# Patient Record
Sex: Female | Born: 1985 | Race: Black or African American | Hispanic: No | Smoking: Former smoker
Health system: Southern US, Community
[De-identification: ages and names within clinical notes are randomized; demographics above are authoritative.]

## PROBLEM LIST (undated history)

## (undated) DIAGNOSIS — R0602 Shortness of breath: Secondary | ICD-10-CM

## (undated) DIAGNOSIS — F32A Depression, unspecified: Secondary | ICD-10-CM

## (undated) DIAGNOSIS — Z5189 Encounter for other specified aftercare: Secondary | ICD-10-CM

## (undated) DIAGNOSIS — F419 Anxiety disorder, unspecified: Secondary | ICD-10-CM

## (undated) DIAGNOSIS — F4310 Post-traumatic stress disorder, unspecified: Secondary | ICD-10-CM

## (undated) DIAGNOSIS — F329 Major depressive disorder, single episode, unspecified: Secondary | ICD-10-CM

## (undated) DIAGNOSIS — A749 Chlamydial infection, unspecified: Secondary | ICD-10-CM

## (undated) DIAGNOSIS — E7800 Pure hypercholesterolemia, unspecified: Secondary | ICD-10-CM

## (undated) DIAGNOSIS — Z22322 Carrier or suspected carrier of Methicillin resistant Staphylococcus aureus: Secondary | ICD-10-CM

## (undated) DIAGNOSIS — R51 Headache: Secondary | ICD-10-CM

## (undated) DIAGNOSIS — N12 Tubulo-interstitial nephritis, not specified as acute or chronic: Secondary | ICD-10-CM

## (undated) DIAGNOSIS — J189 Pneumonia, unspecified organism: Secondary | ICD-10-CM

## (undated) DIAGNOSIS — N888 Other specified noninflammatory disorders of cervix uteri: Secondary | ICD-10-CM

## (undated) DIAGNOSIS — G43909 Migraine, unspecified, not intractable, without status migrainosus: Secondary | ICD-10-CM

---
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
## 2006-05-09 ENCOUNTER — Emergency Department (HOSPITAL_COMMUNITY): Admission: EM | Admit: 2006-05-09 | Discharge: 2006-05-10 | Payer: Self-pay | Admitting: Emergency Medicine

## 2006-06-18 ENCOUNTER — Ambulatory Visit (HOSPITAL_COMMUNITY): Admission: RE | Admit: 2006-06-18 | Discharge: 2006-06-18 | Payer: Self-pay | Admitting: Family Medicine

## 2006-07-19 ENCOUNTER — Inpatient Hospital Stay (HOSPITAL_COMMUNITY): Admission: AD | Admit: 2006-07-19 | Discharge: 2006-07-19 | Payer: Self-pay | Admitting: Obstetrics and Gynecology

## 2006-10-01 ENCOUNTER — Inpatient Hospital Stay (HOSPITAL_COMMUNITY): Admission: AD | Admit: 2006-10-01 | Discharge: 2006-10-05 | Payer: Self-pay | Admitting: Obstetrics and Gynecology

## 2006-12-15 ENCOUNTER — Inpatient Hospital Stay (HOSPITAL_COMMUNITY): Admission: AD | Admit: 2006-12-15 | Discharge: 2006-12-16 | Payer: Self-pay | Admitting: Obstetrics and Gynecology

## 2006-12-24 ENCOUNTER — Inpatient Hospital Stay (HOSPITAL_COMMUNITY): Admission: AD | Admit: 2006-12-24 | Discharge: 2006-12-24 | Payer: Self-pay | Admitting: Obstetrics and Gynecology

## 2006-12-25 ENCOUNTER — Inpatient Hospital Stay (HOSPITAL_COMMUNITY): Admission: AD | Admit: 2006-12-25 | Discharge: 2006-12-25 | Payer: Self-pay | Admitting: Obstetrics and Gynecology

## 2006-12-28 ENCOUNTER — Inpatient Hospital Stay (HOSPITAL_COMMUNITY): Admission: AD | Admit: 2006-12-28 | Discharge: 2006-12-28 | Payer: Self-pay | Admitting: Obstetrics and Gynecology

## 2007-01-02 ENCOUNTER — Inpatient Hospital Stay (HOSPITAL_COMMUNITY): Admission: AD | Admit: 2007-01-02 | Discharge: 2007-01-02 | Payer: Self-pay | Admitting: Obstetrics and Gynecology

## 2007-01-08 ENCOUNTER — Inpatient Hospital Stay (HOSPITAL_COMMUNITY): Admission: AD | Admit: 2007-01-08 | Discharge: 2007-01-08 | Payer: Self-pay | Admitting: Obstetrics and Gynecology

## 2007-01-09 ENCOUNTER — Inpatient Hospital Stay (HOSPITAL_COMMUNITY): Admission: AD | Admit: 2007-01-09 | Discharge: 2007-01-09 | Payer: Self-pay | Admitting: Obstetrics and Gynecology

## 2007-01-11 ENCOUNTER — Inpatient Hospital Stay (HOSPITAL_COMMUNITY): Admission: AD | Admit: 2007-01-11 | Discharge: 2007-01-12 | Payer: Self-pay | Admitting: Obstetrics and Gynecology

## 2007-01-17 ENCOUNTER — Inpatient Hospital Stay (HOSPITAL_COMMUNITY): Admission: AD | Admit: 2007-01-17 | Discharge: 2007-01-22 | Payer: Self-pay | Admitting: Obstetrics and Gynecology

## 2007-01-18 ENCOUNTER — Encounter (INDEPENDENT_AMBULATORY_CARE_PROVIDER_SITE_OTHER): Payer: Self-pay | Admitting: Obstetrics and Gynecology

## 2007-01-23 ENCOUNTER — Encounter: Admission: RE | Admit: 2007-01-23 | Discharge: 2007-02-22 | Payer: Self-pay | Admitting: Obstetrics and Gynecology

## 2007-02-23 ENCOUNTER — Encounter: Admission: RE | Admit: 2007-02-23 | Discharge: 2007-03-24 | Payer: Self-pay | Admitting: Obstetrics and Gynecology

## 2007-03-25 ENCOUNTER — Encounter: Admission: RE | Admit: 2007-03-25 | Discharge: 2007-04-24 | Payer: Self-pay | Admitting: Obstetrics and Gynecology

## 2007-04-25 ENCOUNTER — Encounter: Admission: RE | Admit: 2007-04-25 | Discharge: 2007-05-25 | Payer: Self-pay | Admitting: Obstetrics and Gynecology

## 2007-05-26 ENCOUNTER — Encounter: Admission: RE | Admit: 2007-05-26 | Discharge: 2007-06-25 | Payer: Self-pay | Admitting: Obstetrics and Gynecology

## 2007-08-05 ENCOUNTER — Emergency Department (HOSPITAL_COMMUNITY): Admission: EM | Admit: 2007-08-05 | Discharge: 2007-08-05 | Payer: Self-pay | Admitting: *Deleted

## 2007-08-24 ENCOUNTER — Emergency Department (HOSPITAL_COMMUNITY): Admission: EM | Admit: 2007-08-24 | Discharge: 2007-08-24 | Payer: Self-pay | Admitting: Emergency Medicine

## 2007-09-01 ENCOUNTER — Encounter: Admission: RE | Admit: 2007-09-01 | Discharge: 2007-09-01 | Payer: Self-pay | Admitting: Pediatrics

## 2007-09-01 ENCOUNTER — Ambulatory Visit: Payer: Self-pay | Admitting: Family Medicine

## 2008-03-19 ENCOUNTER — Emergency Department (HOSPITAL_COMMUNITY): Admission: EM | Admit: 2008-03-19 | Discharge: 2008-03-19 | Payer: Self-pay | Admitting: Emergency Medicine

## 2008-05-09 ENCOUNTER — Emergency Department (HOSPITAL_COMMUNITY): Admission: EM | Admit: 2008-05-09 | Discharge: 2008-05-09 | Payer: Self-pay | Admitting: Emergency Medicine

## 2008-09-12 ENCOUNTER — Emergency Department (HOSPITAL_COMMUNITY): Admission: EM | Admit: 2008-09-12 | Discharge: 2008-09-12 | Payer: Self-pay | Admitting: Emergency Medicine

## 2008-10-18 ENCOUNTER — Emergency Department (HOSPITAL_COMMUNITY): Admission: EM | Admit: 2008-10-18 | Discharge: 2008-10-18 | Payer: Self-pay | Admitting: Emergency Medicine

## 2008-11-28 ENCOUNTER — Emergency Department (HOSPITAL_COMMUNITY): Admission: EM | Admit: 2008-11-28 | Discharge: 2008-11-28 | Payer: Self-pay | Admitting: Emergency Medicine

## 2009-01-27 ENCOUNTER — Emergency Department (HOSPITAL_COMMUNITY): Admission: EM | Admit: 2009-01-27 | Discharge: 2009-01-27 | Payer: Self-pay | Admitting: Emergency Medicine

## 2009-05-24 ENCOUNTER — Emergency Department (HOSPITAL_COMMUNITY): Admission: EM | Admit: 2009-05-24 | Discharge: 2009-05-24 | Payer: Self-pay | Admitting: Emergency Medicine

## 2009-06-10 ENCOUNTER — Emergency Department (HOSPITAL_COMMUNITY): Admission: EM | Admit: 2009-06-10 | Discharge: 2009-06-10 | Payer: Self-pay | Admitting: Emergency Medicine

## 2009-07-05 ENCOUNTER — Emergency Department (HOSPITAL_COMMUNITY): Admission: EM | Admit: 2009-07-05 | Discharge: 2009-07-05 | Payer: Self-pay | Admitting: Emergency Medicine

## 2009-07-31 ENCOUNTER — Emergency Department (HOSPITAL_COMMUNITY): Admission: EM | Admit: 2009-07-31 | Discharge: 2009-07-31 | Payer: Self-pay | Admitting: Emergency Medicine

## 2009-08-26 DIAGNOSIS — J189 Pneumonia, unspecified organism: Secondary | ICD-10-CM

## 2009-08-26 HISTORY — DX: Pneumonia, unspecified organism: J18.9

## 2009-09-25 ENCOUNTER — Emergency Department (HOSPITAL_COMMUNITY): Admission: EM | Admit: 2009-09-25 | Discharge: 2009-09-25 | Payer: Self-pay | Admitting: Emergency Medicine

## 2010-05-03 ENCOUNTER — Emergency Department (HOSPITAL_COMMUNITY): Admission: EM | Admit: 2010-05-03 | Discharge: 2010-05-03 | Payer: Self-pay | Admitting: Emergency Medicine

## 2010-08-01 ENCOUNTER — Emergency Department (HOSPITAL_COMMUNITY)
Admission: EM | Admit: 2010-08-01 | Discharge: 2010-08-01 | Payer: Self-pay | Source: Home / Self Care | Admitting: Emergency Medicine

## 2010-09-03 ENCOUNTER — Emergency Department (HOSPITAL_COMMUNITY)
Admission: EM | Admit: 2010-09-03 | Discharge: 2010-09-03 | Payer: Self-pay | Source: Home / Self Care | Admitting: Emergency Medicine

## 2010-09-10 LAB — POCT I-STAT, CHEM 8
BUN: 11 mg/dL (ref 6–23)
Calcium, Ion: 1.15 mmol/L (ref 1.12–1.32)
Chloride: 103 mEq/L (ref 96–112)
Creatinine, Ser: 1 mg/dL (ref 0.4–1.2)
Glucose, Bld: 89 mg/dL (ref 70–99)
HCT: 37 % (ref 36.0–46.0)
Hemoglobin: 12.6 g/dL (ref 12.0–15.0)
Potassium: 3.5 mEq/L (ref 3.5–5.1)
Sodium: 141 mEq/L (ref 135–145)
TCO2: 27 mmol/L (ref 0–100)

## 2010-09-10 LAB — D-DIMER, QUANTITATIVE: D-Dimer, Quant: 1.11 ug/mL-FEU — ABNORMAL HIGH (ref 0.00–0.48)

## 2010-09-15 ENCOUNTER — Encounter: Payer: Self-pay | Admitting: Family Medicine

## 2010-11-08 LAB — CBC
MCH: 27.2 pg (ref 26.0–34.0)
MCHC: 32.6 g/dL (ref 30.0–36.0)
MCV: 83.5 fL (ref 78.0–100.0)
Platelets: 315 10*3/uL (ref 150–400)
RBC: 4.37 MIL/uL (ref 3.87–5.11)

## 2010-11-08 LAB — DIFFERENTIAL
Basophils Relative: 0 % (ref 0–1)
Eosinophils Absolute: 0.3 10*3/uL (ref 0.0–0.7)
Eosinophils Relative: 4 % (ref 0–5)
Lymphs Abs: 2.3 10*3/uL (ref 0.7–4.0)
Monocytes Relative: 9 % (ref 3–12)

## 2010-11-08 LAB — URINALYSIS, ROUTINE W REFLEX MICROSCOPIC
Bilirubin Urine: NEGATIVE
Nitrite: NEGATIVE
Specific Gravity, Urine: 1.03 (ref 1.005–1.030)
Urobilinogen, UA: 1 mg/dL (ref 0.0–1.0)
pH: 6 (ref 5.0–8.0)

## 2010-11-08 LAB — URINE MICROSCOPIC-ADD ON

## 2010-11-08 LAB — BASIC METABOLIC PANEL
CO2: 27 mEq/L (ref 19–32)
Chloride: 101 mEq/L (ref 96–112)
Creatinine, Ser: 0.91 mg/dL (ref 0.4–1.2)
GFR calc Af Amer: >60 mL/min (ref >60)
Glucose, Bld: 93 mg/dL (ref 70–99)

## 2010-11-15 ENCOUNTER — Emergency Department (HOSPITAL_COMMUNITY)
Admission: EM | Admit: 2010-11-15 | Discharge: 2010-11-15 | Disposition: A | Discharge status: Home / Self Care | Payer: Medicaid Other | Attending: Emergency Medicine | Admitting: Emergency Medicine

## 2010-11-15 DIAGNOSIS — S025XXA Fracture of tooth (traumatic), initial encounter for closed fracture: Secondary | ICD-10-CM | POA: Insufficient documentation

## 2010-11-15 DIAGNOSIS — W108XXA Fall (on) (from) other stairs and steps, initial encounter: Secondary | ICD-10-CM | POA: Insufficient documentation

## 2010-11-15 DIAGNOSIS — E669 Obesity, unspecified: Secondary | ICD-10-CM | POA: Insufficient documentation

## 2010-11-15 DIAGNOSIS — K089 Disorder of teeth and supporting structures, unspecified: Secondary | ICD-10-CM | POA: Insufficient documentation

## 2010-11-18 ENCOUNTER — Emergency Department (HOSPITAL_COMMUNITY)
Admission: EM | Admit: 2010-11-18 | Discharge: 2010-11-18 | Disposition: A | Discharge status: Home / Self Care | Payer: Medicaid Other | Attending: Emergency Medicine | Admitting: Emergency Medicine

## 2010-11-18 DIAGNOSIS — R10819 Abdominal tenderness, unspecified site: Secondary | ICD-10-CM | POA: Insufficient documentation

## 2010-11-18 DIAGNOSIS — R109 Unspecified abdominal pain: Secondary | ICD-10-CM | POA: Insufficient documentation

## 2010-11-18 DIAGNOSIS — N949 Unspecified condition associated with female genital organs and menstrual cycle: Secondary | ICD-10-CM | POA: Insufficient documentation

## 2010-11-18 DIAGNOSIS — N938 Other specified abnormal uterine and vaginal bleeding: Secondary | ICD-10-CM | POA: Insufficient documentation

## 2010-11-18 LAB — POCT I-STAT, CHEM 8
BUN: 8 mg/dL (ref 6–23)
Calcium, Ion: 1.09 mmol/L — ABNORMAL LOW (ref 1.12–1.32)
Creatinine, Ser: 0.8 mg/dL (ref 0.4–1.2)
TCO2: 25 mmol/L (ref 0–100)

## 2010-11-28 LAB — DIFFERENTIAL
Basophils Absolute: 0.1 10*3/uL (ref 0.0–0.1)
Lymphocytes Relative: 10 % — ABNORMAL LOW (ref 12–46)
Lymphs Abs: 0.8 10*3/uL (ref 0.7–4.0)
Monocytes Absolute: 0.7 10*3/uL (ref 0.1–1.0)
Monocytes Relative: 9 % (ref 3–12)
Neutro Abs: 6.7 10*3/uL (ref 1.7–7.7)

## 2010-11-28 LAB — CBC
HCT: 39.9 % (ref 36.0–46.0)
MCHC: 33.4 g/dL (ref 30.0–36.0)
MCV: 84.6 fL (ref 78.0–100.0)
Platelets: 326 10*3/uL (ref 150–400)
RDW: 13.9 % (ref 11.5–15.5)

## 2010-11-28 LAB — COMPREHENSIVE METABOLIC PANEL
Albumin: 4.1 g/dL (ref 3.5–5.2)
BUN: 13 mg/dL (ref 6–23)
Calcium: 9.1 mg/dL (ref 8.4–10.5)
Creatinine, Ser: 0.76 mg/dL (ref 0.4–1.2)
Total Bilirubin: 0.8 mg/dL (ref 0.3–1.2)
Total Protein: 8.4 g/dL — ABNORMAL HIGH (ref 6.0–8.3)

## 2010-11-28 LAB — URINALYSIS, ROUTINE W REFLEX MICROSCOPIC
Hgb urine dipstick: NEGATIVE
Specific Gravity, Urine: 1.025 (ref 1.005–1.030)
Urobilinogen, UA: 1 mg/dL (ref 0.0–1.0)

## 2010-11-28 LAB — POCT PREGNANCY, URINE: Preg Test, Ur: NEGATIVE

## 2010-11-30 LAB — URINE CULTURE

## 2010-11-30 LAB — URINALYSIS, ROUTINE W REFLEX MICROSCOPIC
Ketones, ur: NEGATIVE mg/dL
Nitrite: NEGATIVE
Protein, ur: NEGATIVE mg/dL
pH: 8 (ref 5.0–8.0)

## 2010-11-30 LAB — CBC
HCT: 39.7 % (ref 36.0–46.0)
Hemoglobin: 13.3 g/dL (ref 12.0–15.0)
MCHC: 33.4 g/dL (ref 30.0–36.0)
MCV: 85.9 fL (ref 78.0–100.0)
RBC: 4.62 MIL/uL (ref 3.87–5.11)
WBC: 9.8 10*3/uL (ref 4.0–10.5)

## 2010-11-30 LAB — DIFFERENTIAL
Eosinophils Absolute: 0 10*3/uL (ref 0.0–0.7)
Lymphs Abs: 1.2 10*3/uL (ref 0.7–4.0)
Monocytes Absolute: 0.5 10*3/uL (ref 0.1–1.0)
Monocytes Relative: 5 % (ref 3–12)
Neutrophils Relative %: 82 % — ABNORMAL HIGH (ref 43–77)

## 2010-11-30 LAB — BASIC METABOLIC PANEL
CO2: 27 mEq/L (ref 19–32)
Chloride: 99 mEq/L (ref 96–112)
Creatinine, Ser: 0.91 mg/dL (ref 0.4–1.2)
GFR calc Af Amer: >60 mL/min (ref >60)
Potassium: 3.7 mEq/L (ref 3.5–5.1)
Sodium: 134 mEq/L — ABNORMAL LOW (ref 135–145)

## 2010-11-30 LAB — POCT CARDIAC MARKERS: Troponin i, poc: <0.05 ng/mL (ref 0.00–0.09)

## 2010-12-10 LAB — CBC
HCT: 39.1 % (ref 36.0–46.0)
MCV: 86.5 fL (ref 78.0–100.0)
RBC: 4.52 MIL/uL (ref 3.87–5.11)
WBC: 7.1 10*3/uL (ref 4.0–10.5)

## 2010-12-10 LAB — URINE MICROSCOPIC-ADD ON

## 2010-12-10 LAB — COMPREHENSIVE METABOLIC PANEL
AST: 25 U/L (ref 0–37)
Alkaline Phosphatase: 54 U/L (ref 39–117)
CO2: 29 mEq/L (ref 19–32)
Chloride: 106 mEq/L (ref 96–112)
Creatinine, Ser: 0.88 mg/dL (ref 0.4–1.2)
GFR calc Af Amer: >60 mL/min (ref >60)
GFR calc non Af Amer: >60 mL/min (ref >60)
Potassium: 4.1 mEq/L (ref 3.5–5.1)
Total Bilirubin: 0.6 mg/dL (ref 0.3–1.2)

## 2010-12-10 LAB — DIFFERENTIAL
Basophils Absolute: 0 10*3/uL (ref 0.0–0.1)
Basophils Relative: 0 % (ref 0–1)
Eosinophils Absolute: 0.2 10*3/uL (ref 0.0–0.7)
Eosinophils Relative: 3 % (ref 0–5)
Lymphocytes Relative: 9 % — ABNORMAL LOW (ref 12–46)
Lymphs Abs: 0.6 10*3/uL — ABNORMAL LOW (ref 0.7–4.0)
Monocytes Absolute: 0.4 10*3/uL (ref 0.1–1.0)
Monocytes Relative: 5 % (ref 3–12)
Neutro Abs: 5.9 10*3/uL (ref 1.7–7.7)
Neutrophils Relative %: 83 % — ABNORMAL HIGH (ref 43–77)

## 2010-12-10 LAB — URINALYSIS, ROUTINE W REFLEX MICROSCOPIC
Bilirubin Urine: NEGATIVE
Hgb urine dipstick: NEGATIVE
Protein, ur: 30 mg/dL — AB
Urobilinogen, UA: 0.2 mg/dL (ref 0.0–1.0)

## 2010-12-10 LAB — LIPASE, BLOOD: Lipase: 18 U/L (ref 11–59)

## 2010-12-11 LAB — URINALYSIS, ROUTINE W REFLEX MICROSCOPIC
Bilirubin Urine: NEGATIVE
Glucose, UA: NEGATIVE mg/dL
Ketones, ur: NEGATIVE mg/dL
Nitrite: NEGATIVE
Specific Gravity, Urine: 1.026 (ref 1.005–1.030)
pH: 6.5 (ref 5.0–8.0)

## 2010-12-11 LAB — DIFFERENTIAL
Eosinophils Absolute: 0.2 10*3/uL (ref 0.0–0.7)
Eosinophils Relative: 3 % (ref 0–5)
Lymphocytes Relative: 37 % (ref 12–46)
Lymphs Abs: 2.3 10*3/uL (ref 0.7–4.0)
Monocytes Relative: 7 % (ref 3–12)

## 2010-12-11 LAB — COMPREHENSIVE METABOLIC PANEL
ALT: 12 U/L (ref 0–35)
AST: 17 U/L (ref 0–37)
Calcium: 8.9 mg/dL (ref 8.4–10.5)
Creatinine, Ser: 0.8 mg/dL (ref 0.4–1.2)
GFR calc Af Amer: >60 mL/min (ref >60)
GFR calc non Af Amer: >60 mL/min (ref >60)
Sodium: 138 mEq/L (ref 135–145)
Total Protein: 7.4 g/dL (ref 6.0–8.3)

## 2010-12-11 LAB — CBC
MCHC: 33.6 g/dL (ref 30.0–36.0)
MCV: 86 fL (ref 78.0–100.0)
Platelets: 286 10*3/uL (ref 150–400)
RDW: 13.6 % (ref 11.5–15.5)

## 2010-12-11 LAB — LIPASE, BLOOD: Lipase: 55 U/L (ref 11–59)

## 2011-01-08 ENCOUNTER — Emergency Department (HOSPITAL_COMMUNITY)
Admission: EM | Admit: 2011-01-08 | Discharge: 2011-01-08 | Disposition: A | Discharge status: Home / Self Care | Payer: Medicaid Other | Attending: Emergency Medicine | Admitting: Emergency Medicine

## 2011-01-08 ENCOUNTER — Emergency Department (HOSPITAL_COMMUNITY): Payer: Medicaid Other

## 2011-01-08 DIAGNOSIS — S335XXA Sprain of ligaments of lumbar spine, initial encounter: Secondary | ICD-10-CM | POA: Insufficient documentation

## 2011-01-08 DIAGNOSIS — S8010XA Contusion of unspecified lower leg, initial encounter: Secondary | ICD-10-CM | POA: Insufficient documentation

## 2011-01-08 DIAGNOSIS — M549 Dorsalgia, unspecified: Secondary | ICD-10-CM | POA: Insufficient documentation

## 2011-01-08 DIAGNOSIS — R079 Chest pain, unspecified: Secondary | ICD-10-CM | POA: Insufficient documentation

## 2011-01-08 NOTE — Discharge Summary (Signed)
[name], Emma Green           ACCOUNT NO.:  [id number]   MEDICAL RECORD NO.:  [id number]          PATIENT TYPE:  INP   LOCATION:  9319                          FACILITY:  [facility]   PHYSICIAN:  Gerald Leitz, MD          DATE OF BIRTH:  03-16-1986   DATE OF ADMISSION:  01/17/2007  DATE OF DISCHARGE:  01/22/2007                               DISCHARGE SUMMARY   INDICATIONS FOR ADMISSION:  The patient with 39-week 5-day intrauterine  pregnancy with nonreassuring fetal testing, AFI 4.8, oligohydramnios.   DISCHARGE DIAGNOSES:  1. Twp with 39-5/7-week intrauterine pregnancy.  2. Oligohydramnios.  3. Failure to progress.  4. Chorioamnionitis.  5. Status post primary cesarean section.   BRIEF HOSPITAL COURSE:  The patient was admitted on Jan 18, 2007, after  having a BPP which showed an AFI of 4.8.  She underwent a Pitocin  induction.  She developed chorioamnionitis with a temperature of 100.6  at 1720.  She had a maximum cervical dilatation of 6 cm.  She underwent  primary cesarean section.  She did well postoperatively.  Hemoglobin  postoperatively was 9.  She received cefoxitin for chorioamnionitis and  iron sulfate for anemia.  She was discharged home on postoperative day  No. 4 on the following medications:  Motrin, iron, and Percocet.  Staples were removed prior to discharge.  She will follow up at [facility] in 4-6 weeks.   DIET:  Regular.   ACTIVITY:  Pelvic rest x6 weeks.      Gerald Leitz, MD  Electronically Signed     TC/MEDQ  D:  01/22/2007  T:  01/22/2007  Job:  [id number]

## 2011-01-08 NOTE — Op Note (Signed)
Emma Green, Emma Green           ACCOUNT NO.:  [id number]   MEDICAL RECORD NO.:  [id number]          PATIENT TYPE:  INP   LOCATION:  9319                          FACILITY:  [facility]   PHYSICIAN:  Charles A. Delcambre, MDDATE OF BIRTH:  1986-06-24   DATE OF PROCEDURE:  DATE OF DISCHARGE:                               OPERATIVE REPORT   PREOPERATIVE DIAGNOSIS:  1. Intrauterine pregnancy at 39 weeks 6 days.  2. Chorioamnionitis.  3. Arrest of dilatation 6-7 cm.  4. Cephalopelvic disproportion.  5. Nuchal cord x2.   POSTOPERATIVE DIAGNOSIS:  1. Intrauterine pregnancy at 39 weeks 6 days.  2. Chorioamnionitis.  3. Arrest of dilatation 6-7 cm.  4. Cephalopelvic disproportion.  5. Nuchal cord x2.   PROCEDURE:  Primary low transverse cesarean section.   SURGEON:  Charles A. Sydnee Cabal, M.D.   ASSISTANT:  None.   COUNTS:  Needle, sponge, and instrument counts correct x2.   ESTIMATED BLOOD LOSS:  600 mL.   ANESTHESIA:  Epidural.   SPECIMENS:  Placenta to pathology.  Cord blood drawn by [facility]  Blood attendant after placenta was handed off to go to pathology.   COMPLICATIONS:  Nuchal cord x2, tight.   FINDINGS:  The baby was vigorous after initial stimulation.  For those  activities, see Dr. Michaelle Copas orders from the NICU, neonatologist  attending.  Apgars were 5 and 9.  There was a significant stridor and  for this reason, the baby was taken to the NICU.  The baby was female,  2810 grams.  Cord arterial blood gas 7.15 pH and venous 7.24.   DESCRIPTION OF PROCEDURE:  The patient was taken to the operating room,  placed in the supine position.  Spinal was adequate and sterile prep and  drape was undertaken.  A Pfannenstiel incision was made with a knife and  carried down to fascia.  The fascia was incised with the knife and Mayo  scissors.  The rectus muscles were sharply dissected in the midline  after rectus sheath was released superiorly and inferiorly sharply.  The  peritoneum was entered with Metzenbaum scissors without damage to the  bowel, bladder, or vascular structures.  The bladder blade was placed,  thus uterine peritoneum was incised with the Metzenbaum scissors and  blunt dissection was used to develop the bladder flap and bladder blade  was replaced.  The lower uterine segment transverse incision to  amniotomy was done to note clear fluid.  No damage to the infant.  Vertical traction was used to extend the incision.  Hand was inserted.  Occiput was lifted out of the uterine incision and fundal pressure  applied by the operator's assistant was used to deliver the baby without  difficulty.  Cords were reduced individually in order to get the  shoulders to deliver and of note, cords were moderately tight.  The  infant was then delivered, did have grimace upon delivery and very  initially had some tone present.  Cord was clamped and the infant was  briefly shown to the mother and handed off to Angelita Ingles, M.D. as  noted above.  The placenta was  manually expressed and sent to pathology.  Uterus was externalized.  Internal surface of the uterus was wiped with  a moistened lap to clean any clots and membranous material.  The uterus  was closed with #1 chromic running locking first layer and running non-  locking imbricating second layer with good hemostasis resulting.  The  uterus was reinternalized after repair.  The pericolic gutters and  bladder flap were irrigated.  There was minimal bogginess around the  bladder flap on the uterus and hemostasis was excellent.  Subfascial  hemostasis was excellent.  Fascia was closed with #1 Vicryl running  nonlocking suture.  The subcutaneous tissue was irrigated copiously.  Minimal electrocautery was used to achieve hemostasis.  Sterile skin  clips were used to close the skin.  Sterile dressing was applied.  The  patient was taken to the recovery room with physician in attendance  having tolerated the  procedure well.      Charles A. Sydnee Cabal, MD  Electronically Signed     CAD/MEDQ  D:  01/18/2007  T:  01/18/2007  Job:  [id number]

## 2011-05-02 ENCOUNTER — Emergency Department (HOSPITAL_COMMUNITY)
Admission: EM | Admit: 2011-05-02 | Discharge: 2011-05-02 | Disposition: A | Discharge status: Home / Self Care | Payer: Medicaid Other | Attending: Emergency Medicine | Admitting: Emergency Medicine

## 2011-05-29 LAB — POCT I-STAT, CHEM 8
Hemoglobin: 12.9
Potassium: 3.6
Sodium: 139
TCO2: 25

## 2011-05-29 LAB — D-DIMER, QUANTITATIVE: D-Dimer, Quant: 0.62 — ABNORMAL HIGH

## 2011-05-31 LAB — COMPREHENSIVE METABOLIC PANEL
ALT: 13
AST: 20
Alkaline Phosphatase: 57
CO2: 25
Calcium: 8.6
Chloride: 106
GFR calc Af Amer: >60
GFR calc non Af Amer: >60
Glucose, Bld: 71
Potassium: 3.5
Sodium: 139
Total Bilirubin: 0.5

## 2011-05-31 LAB — DIFFERENTIAL
Basophils Relative: 1
Eosinophils Absolute: 0.2
Eosinophils Relative: 2
Lymphs Abs: 2.4
Neutrophils Relative %: 54

## 2011-05-31 LAB — URINALYSIS, ROUTINE W REFLEX MICROSCOPIC
Glucose, UA: NEGATIVE
Ketones, ur: NEGATIVE
Protein, ur: NEGATIVE
Urobilinogen, UA: 0.2

## 2011-05-31 LAB — URINE MICROSCOPIC-ADD ON

## 2011-05-31 LAB — CBC
Hemoglobin: 11.2 — ABNORMAL LOW
RBC: 3.92
WBC: 6.7

## 2011-06-11 ENCOUNTER — Emergency Department (HOSPITAL_COMMUNITY)
Admission: EM | Admit: 2011-06-11 | Discharge: 2011-06-12 | Disposition: A | Discharge status: Home / Self Care | Payer: Medicaid Other | Attending: Emergency Medicine | Admitting: Emergency Medicine

## 2011-06-11 ENCOUNTER — Emergency Department (HOSPITAL_COMMUNITY): Payer: Medicaid Other

## 2011-06-11 DIAGNOSIS — M542 Cervicalgia: Secondary | ICD-10-CM | POA: Insufficient documentation

## 2011-06-11 DIAGNOSIS — IMO0002 Reserved for concepts with insufficient information to code with codable children: Secondary | ICD-10-CM | POA: Insufficient documentation

## 2011-06-11 DIAGNOSIS — Y92009 Unspecified place in unspecified non-institutional (private) residence as the place of occurrence of the external cause: Secondary | ICD-10-CM | POA: Insufficient documentation

## 2011-06-11 DIAGNOSIS — M549 Dorsalgia, unspecified: Secondary | ICD-10-CM | POA: Insufficient documentation

## 2011-06-11 DIAGNOSIS — W108XXA Fall (on) (from) other stairs and steps, initial encounter: Secondary | ICD-10-CM | POA: Insufficient documentation

## 2011-06-11 DIAGNOSIS — S139XXA Sprain of joints and ligaments of unspecified parts of neck, initial encounter: Secondary | ICD-10-CM | POA: Insufficient documentation

## 2011-06-12 ENCOUNTER — Emergency Department (HOSPITAL_COMMUNITY): Payer: Medicaid Other

## 2011-09-30 ENCOUNTER — Other Ambulatory Visit: Payer: Self-pay

## 2011-09-30 ENCOUNTER — Emergency Department (HOSPITAL_COMMUNITY): Payer: Medicaid Other

## 2011-09-30 ENCOUNTER — Emergency Department (HOSPITAL_COMMUNITY)
Admission: EM | Admit: 2011-09-30 | Discharge: 2011-09-30 | Disposition: A | Discharge status: Home / Self Care | Payer: Medicaid Other | Attending: Emergency Medicine | Admitting: Emergency Medicine

## 2011-09-30 ENCOUNTER — Encounter (HOSPITAL_COMMUNITY): Payer: Self-pay | Admitting: *Deleted

## 2011-09-30 DIAGNOSIS — R112 Nausea with vomiting, unspecified: Secondary | ICD-10-CM | POA: Insufficient documentation

## 2011-09-30 DIAGNOSIS — K529 Noninfective gastroenteritis and colitis, unspecified: Secondary | ICD-10-CM

## 2011-09-30 DIAGNOSIS — F17200 Nicotine dependence, unspecified, uncomplicated: Secondary | ICD-10-CM | POA: Insufficient documentation

## 2011-09-30 DIAGNOSIS — R10819 Abdominal tenderness, unspecified site: Secondary | ICD-10-CM | POA: Insufficient documentation

## 2011-09-30 DIAGNOSIS — R071 Chest pain on breathing: Secondary | ICD-10-CM | POA: Insufficient documentation

## 2011-09-30 DIAGNOSIS — K5289 Other specified noninfective gastroenteritis and colitis: Secondary | ICD-10-CM | POA: Insufficient documentation

## 2011-09-30 DIAGNOSIS — R109 Unspecified abdominal pain: Secondary | ICD-10-CM | POA: Insufficient documentation

## 2011-09-30 LAB — DIFFERENTIAL
Basophils Absolute: 0 10*3/uL (ref 0.0–0.1)
Basophils Relative: 0 % (ref 0–1)
Eosinophils Absolute: 0 10*3/uL (ref 0.0–0.7)
Neutro Abs: 9.1 10*3/uL — ABNORMAL HIGH (ref 1.7–7.7)
Neutrophils Relative %: 91 % — ABNORMAL HIGH (ref 43–77)

## 2011-09-30 LAB — CBC
MCH: 27.9 pg (ref 26.0–34.0)
MCHC: 32.5 g/dL (ref 30.0–36.0)
RDW: 13.2 % (ref 11.5–15.5)

## 2011-09-30 LAB — URINALYSIS, ROUTINE W REFLEX MICROSCOPIC
Leukocytes, UA: NEGATIVE
Nitrite: NEGATIVE
Protein, ur: NEGATIVE mg/dL
Urobilinogen, UA: 0.2 mg/dL (ref 0.0–1.0)

## 2011-09-30 LAB — COMPREHENSIVE METABOLIC PANEL
AST: 28 U/L (ref 0–37)
Albumin: 4.2 g/dL (ref 3.5–5.2)
Alkaline Phosphatase: 63 U/L (ref 39–117)
Chloride: 103 mEq/L (ref 96–112)
Creatinine, Ser: 0.81 mg/dL (ref 0.50–1.10)
Potassium: 3.8 mEq/L (ref 3.5–5.1)
Total Bilirubin: 0.3 mg/dL (ref 0.3–1.2)
Total Protein: 8.7 g/dL — ABNORMAL HIGH (ref 6.0–8.3)

## 2011-09-30 LAB — PREGNANCY, URINE: Preg Test, Ur: NEGATIVE

## 2011-09-30 LAB — LIPASE, BLOOD: Lipase: 14 U/L (ref 11–59)

## 2011-09-30 MED ORDER — ONDANSETRON HCL 4 MG/2ML IJ SOLN
4.0000 mg | Freq: Once | INTRAMUSCULAR | Status: AC
  Administered 2011-09-30: 4 mg via INTRAVENOUS
  Filled 2011-09-30: qty 2

## 2011-09-30 MED ORDER — SODIUM CHLORIDE 0.9 % IV BOLUS (SEPSIS)
1000.0000 mL | Freq: Once | INTRAVENOUS | Status: AC
  Administered 2011-09-30: 1000 mL via INTRAVENOUS

## 2011-09-30 MED ORDER — KETOROLAC TROMETHAMINE 30 MG/ML IJ SOLN
30.0000 mg | Freq: Once | INTRAMUSCULAR | Status: AC
  Administered 2011-09-30: 30 mg via INTRAVENOUS
  Filled 2011-09-30: qty 1

## 2011-09-30 MED ORDER — GI COCKTAIL ~~LOC~~
30.0000 mL | Freq: Once | ORAL | Status: AC
  Administered 2011-09-30: 30 mL via ORAL
  Filled 2011-09-30: qty 30

## 2011-09-30 MED ORDER — ONDANSETRON HCL 4 MG PO TABS
4.0000 mg | ORAL_TABLET | Freq: Four times a day (QID) | ORAL | Status: AC
Start: 2011-09-30 — End: 2011-10-07

## 2011-09-30 MED ORDER — HYDROMORPHONE HCL PF 1 MG/ML IJ SOLN
1.0000 mg | Freq: Once | INTRAMUSCULAR | Status: AC
  Administered 2011-09-30: 1 mg via INTRAVENOUS
  Filled 2011-09-30: qty 1

## 2011-09-30 NOTE — ED Notes (Signed)
Pt reports onset of abdominal pain/chest pain since this am. States pain centralized in chest, non radiating. Not associated with diaphoresis, shortness of breath, dizziness. Abdominal pain in lower abdomen with nausea/vomiting. Denies urinary symptoms.

## 2011-09-30 NOTE — ED Provider Notes (Signed)
History     CSN: [id number]  Arrival date & time 09/30/11  1538   First MD Initiated Contact with Patient 09/30/11 1737      Chief Complaint  Patient presents with  . Chest Pain  . Abdominal Pain    (Consider location/radiation/quality/duration/timing/severity/associated sxs/prior treatment) HPI  Patient presents to Emergency Department complaining of acute onset nausea and vomiting that was followed by acute onset upper abdominal pain and cramping as well as loose stool. Patient states symptoms began at approximately 7:30 this morning after she woke up. Patient states since onset there has been intermittent abdominal cramping and pain in her upper abdominal region as well as multiple episodes of vomiting and loose stool. Patient denies sick contacts. Patient states she had similar symptoms "many years ago but I can't remember what was wrong." Patient states she's able to eat whatever diet she chooses without any abdominal pain. Patient states she has history of cesarean section but denies any other history of abdominal surgeries. Patient denies fevers, chills, dysuria, hematuria, or blood in her stool. Patient states that she is aching into her chest with chest tender to touch. Patient states the pain is radiating from her abdomen into her chest. She denies any diaphoresis, shortness of breath, or dizziness. Patient has no known medical problems takes no medicines on regular basis  History reviewed. No pertinent past medical history.  Past Surgical History  Procedure Date  . Cesarean section     No family history on file.  History  Substance Use Topics  . Smoking status: Current Everyday Smoker -- 0.5 packs/day  . Smokeless tobacco: Not on file  . Alcohol Use: Yes     at least once a week    OB History    Grav Para Term Preterm Abortions TAB SAB Ect Mult Living                  Review of Systems  All other systems reviewed and are negative.    Allergies  Review of  patient's allergies indicates no known allergies.  Home Medications  No current outpatient prescriptions on file.  BP 104/63  Pulse 104  Temp(Src) 99.6 F (37.6 C) (Oral)  Resp 18  Ht 5\' 2"  (1.575 m)  SpO2 100%  LMP 09/02/2011  Physical Exam  Nursing note and vitals reviewed. Constitutional: She is oriented to person, place, and time. She appears well-developed and well-nourished. No distress.  HENT:  Head: Normocephalic and atraumatic.  Eyes: Conjunctivae are normal.  Neck: Normal range of motion. Neck supple.  Cardiovascular: Normal rate, regular rhythm, normal heart sounds and intact distal pulses.  Exam reveals no gallop and no friction rub.   No murmur heard. Pulmonary/Chest: Effort normal and breath sounds normal. No respiratory distress. She has no wheezes. She has no rales. She exhibits tenderness.       TTP of entire anterior chest wall without skin changes or crepitous.   Abdominal: Soft. Normal aorta and bowel sounds are normal. She exhibits no distension and no mass. There is tenderness in the right upper quadrant, epigastric area and left upper quadrant. There is no rigidity, no rebound and no guarding.  Musculoskeletal: Normal range of motion. She exhibits no edema and no tenderness.  Neurological: She is alert and oriented to person, place, and time.  Skin: Skin is warm and dry. No rash noted. She is not diaphoretic. No erythema.  Psychiatric: She has a normal mood and affect.    ED Course  Procedures (  including critical care time)  IV fluids, IV dilaudid and zofran  IV zofran, toradol adn GI cockail.  Vomiting has resolved in ER. Patient complaining of mild ongoing upper abdominal pain and cramping but no peritoneal signs.   Date: 09/30/2011  Rate: 104  Rhythm: sinus tachycardia  QRS Axis: normal  Intervals: normal  ST/T Wave abnormalities: normal  Conduction Disutrbances:none  Narrative Interpretation:   Old EKG Reviewed: unchanged    Labs  Reviewed  URINALYSIS, ROUTINE W REFLEX MICROSCOPIC - Abnormal; Notable for the following:    APPearance CLOUDY (*)    All other components within normal limits  DIFFERENTIAL - Abnormal; Notable for the following:    Neutrophils Relative 91 (*)    Neutro Abs 9.1 (*)    Lymphocytes Relative 5 (*)    Lymphs Abs 0.5 (*)    All other components within normal limits  COMPREHENSIVE METABOLIC PANEL - Abnormal; Notable for the following:    Total Protein 8.7 (*)    All other components within normal limits  PREGNANCY, URINE  CBC  LIPASE, BLOOD   US Abdomen Complete  09/30/2011  *RADIOLOGY REPORT*  Clinical Data:  Upper abdominal pain  COMPLETE ABDOMINAL ULTRASOUND  Comparison:  CT 05/03/2010.  Ultrasound 10/01/2006  Findings:  Gallbladder:  No gallstones, sludge or wall thickening.  I think there is a 5 mm polyp at the fundus.  No pericholecystic fluid.  Common bile duct:  Normal at 4 mm.  Liver:  Slightly echogenic suggesting fatty change.  No evidence of focal lesion.  IVC:  Normal  Pancreas:  Poorly seen because of overlying bowel gas.  Spleen:  Normal at 8.8 cm.  No focal lesions.  Right Kidney:  Normal 11.3 cm.  Normal echogenicity.  No cyst, mass, stone or hydronephrosis.  Left Kidney:  Similarly normal 11.6 cm.  Abdominal aorta:  No aneurysm.  No ascites  IMPRESSION: No cause of abdominal pain identified.  Small, presumably incidental polyp at the fundus of the gallbladder.  No other positive finding.  Poor visualization of the pancreas because of overlying bowel gas.  Original Report Authenticated By: Thomasenia Sales, M.D.     1. Gastroenteritis       MDM  Question gastroenteritis. Acute onset symptoms of nausea vomiting and diarrhea with no peritoneal signs of abdomen and no acute findings on labs. Ultrasound of abdomen shows no acute findings. Patient is afebrile and nontoxic-appearing. Vomiting has resolved in the ER. Spoke at length with patient about worrisome signs or symptoms that  would warned to return to the emergency department otherwise to symptomatically treat at home the medics and over-the-counter pain medicine. Patient voices her understanding and is agreeable to plan.  Chest is Tender to touch but non provocative EKG and low risk ACS and PE.         Jenness Corner, [place] 09/30/11 2006

## 2011-10-01 NOTE — ED Provider Notes (Signed)
Medical screening examination/treatment/procedure(s) were performed by non-physician practitioner and as supervising physician I was immediately available for consultation/collaboration.   Marwah Disbro L Ranulfo Kall, MD 10/01/11 1621 

## 2011-11-26 ENCOUNTER — Inpatient Hospital Stay (HOSPITAL_COMMUNITY): Payer: Medicaid Other

## 2011-11-26 ENCOUNTER — Encounter (HOSPITAL_COMMUNITY): Payer: Self-pay | Admitting: *Deleted

## 2011-11-26 ENCOUNTER — Inpatient Hospital Stay (HOSPITAL_COMMUNITY)
Admission: AD | Admit: 2011-11-26 | Discharge: 2011-11-26 | Disposition: A | Discharge status: Home / Self Care | Payer: Medicaid Other | Source: Ambulatory Visit | Attending: Family Medicine | Admitting: Family Medicine

## 2011-11-26 DIAGNOSIS — N938 Other specified abnormal uterine and vaginal bleeding: Secondary | ICD-10-CM | POA: Insufficient documentation

## 2011-11-26 DIAGNOSIS — A5901 Trichomonal vulvovaginitis: Secondary | ICD-10-CM

## 2011-11-26 DIAGNOSIS — A599 Trichomoniasis, unspecified: Secondary | ICD-10-CM

## 2011-11-26 DIAGNOSIS — N939 Abnormal uterine and vaginal bleeding, unspecified: Secondary | ICD-10-CM

## 2011-11-26 DIAGNOSIS — N949 Unspecified condition associated with female genital organs and menstrual cycle: Secondary | ICD-10-CM | POA: Insufficient documentation

## 2011-11-26 HISTORY — DX: Chlamydial infection, unspecified: A74.9

## 2011-11-26 HISTORY — DX: Other specified noninflammatory disorders of cervix uteri: N88.8

## 2011-11-26 HISTORY — DX: Tubulo-interstitial nephritis, not specified as acute or chronic: N12

## 2011-11-26 LAB — WET PREP, GENITAL: Clue Cells Wet Prep HPF POC: NONE SEEN

## 2011-11-26 LAB — CBC
HCT: 34.7 % — ABNORMAL LOW (ref 36.0–46.0)
Hemoglobin: 11 g/dL — ABNORMAL LOW (ref 12.0–15.0)
MCHC: 31.7 g/dL (ref 30.0–36.0)
RBC: 3.98 MIL/uL (ref 3.87–5.11)

## 2011-11-26 LAB — URINE MICROSCOPIC-ADD ON

## 2011-11-26 LAB — URINALYSIS, ROUTINE W REFLEX MICROSCOPIC
Bilirubin Urine: NEGATIVE
Ketones, ur: NEGATIVE mg/dL
Nitrite: NEGATIVE
Protein, ur: NEGATIVE mg/dL
Specific Gravity, Urine: 1.02 (ref 1.005–1.030)
Urobilinogen, UA: 0.2 mg/dL (ref 0.0–1.0)

## 2011-11-26 LAB — POCT PREGNANCY, URINE: Preg Test, Ur: NEGATIVE

## 2011-11-26 MED ORDER — NORGESTIMATE-ETH ESTRADIOL 0.25-35 MG-MCG PO TABS: 1.0000 | ORAL_TABLET | Freq: Every day | ORAL | Status: DC

## 2011-11-26 MED ORDER — IBUPROFEN 600 MG PO TABS
600.0000 mg | ORAL_TABLET | ORAL | Status: AC
  Administered 2011-11-26: 600 mg via ORAL
  Filled 2011-11-26: qty 1

## 2011-11-26 MED ORDER — METRONIDAZOLE 500 MG PO TABS
2000.0000 mg | ORAL_TABLET | ORAL | Status: AC
  Administered 2011-11-26: 2000 mg via ORAL
  Filled 2011-11-26: qty 4

## 2011-11-26 MED ORDER — IBUPROFEN 600 MG PO TABS: 600.0000 mg | ORAL_TABLET | Freq: Four times a day (QID) | ORAL | Status: AC | PRN

## 2011-11-26 NOTE — MAU Provider Note (Signed)
History     CSN: [id number]  Arrival date and time: 11/26/11 1155   First Provider Initiated Contact with Patient 11/26/11 1322     26 y.o.G1P1001  Chief Complaint  Patient presents with  . Vaginal Bleeding   HPI Pt presents with report of menses x3 this month.  First period started 10/29/11 and lasted 5-7 days.  One week later she started bleeding again which lasted 7 days.  One week after this bleeding stopped, she started bleeding again and is currently having bright red bleeding accompanied by shooting pain on her left side.  She denies dizziness, n/v, h/a, urinary symptoms, vaginal itching/burning, or fever/chills.     OB History    Grav Para Term Preterm Abortions TAB SAB Ect Mult Living   1 1 1       1       Past Medical History  Diagnosis Date  . Pyelonephritis   . Chlamydia   . Cervical cyst     Past Surgical History  Procedure Date  . Cesarean section     History reviewed. No pertinent family history.  History  Substance Use Topics  . Smoking status: Current Everyday Smoker -- 0.5 packs/day for 9 years    Types: Cigarettes  . Smokeless tobacco: Not on file  . Alcohol Use: Yes     socially    Allergies: No Known Allergies  Prescriptions prior to admission  Medication Sig Dispense Refill  . traMADol (ULTRAM) 50 MG tablet Take 50 mg by mouth every 6 (six) hours as needed. For pain.        Review of Systems  Constitutional: Negative for fever, chills and malaise/fatigue.  Eyes: Negative for blurred vision.  Respiratory: Negative for cough and shortness of breath.   Cardiovascular: Negative for chest pain.  Gastrointestinal: Positive for abdominal pain. Negative for heartburn, nausea and vomiting.  Genitourinary: Negative for dysuria, urgency and frequency.  Musculoskeletal: Negative.   Neurological: Negative for dizziness and headaches.  Psychiatric/Behavioral: Negative for depression.   Physical Exam   Blood pressure 117/73, pulse 86,  temperature 98.9 F (37.2 C), temperature source Oral, resp. rate 20, height 5' 2.5" (1.588 m), weight 92.534 kg (204 lb), last menstrual period 11/22/2011.  Physical Exam  Nursing note and vitals reviewed. Constitutional: She is oriented to person, place, and time. She appears well-developed and well-nourished.  Neck: Normal range of motion.  Cardiovascular: Normal rate.   Respiratory: Effort normal.  GI: Soft.  Genitourinary:       Pelvic exam:  Large amount bright red vaginal bleeding, Cervix, vaginal walls, external genitalia normal  Bimanual exam:  Cervix 0/long/high, anterior, neg CMT, uterus slightly enlarged, nontender, adnexa with mild tenderness on left, no mass or enlargement palpable  Musculoskeletal: Normal range of motion.  Neurological: She is alert and oriented to person, place, and time.  Skin: Skin is warm and dry.  Psychiatric: She has a normal mood and affect. Her behavior is normal. Judgment and thought content normal.    MAU Course  Procedures Results for orders placed during the hospital encounter of 11/26/11 (from the past 24 hour(s))  POCT PREGNANCY, URINE     Status: Normal   Collection Time   11/26/11 12:25 PM      Component Value Range   Preg Test, Ur NEGATIVE  NEGATIVE   WET PREP, GENITAL     Status: Abnormal   Collection Time   11/26/11  1:35 PM      Component Value Range  Yeast Wet Prep HPF POC NONE SEEN  NONE SEEN    Trich, Wet Prep FEW (*) NONE SEEN    Clue Cells Wet Prep HPF POC NONE SEEN  NONE SEEN    WBC, Wet Prep HPF POC FEW (*) NONE SEEN   URINALYSIS, ROUTINE W REFLEX MICROSCOPIC     Status: Abnormal   Collection Time   11/26/11  1:38 PM      Component Value Range   Color, Urine YELLOW  YELLOW    APPearance HAZY (*) CLEAR    Specific Gravity, Urine 1.020  1.005 - 1.030    pH 7.0  5.0 - 8.0    Glucose, UA NEGATIVE  NEGATIVE (mg/dL)   Hgb urine dipstick LARGE (*) NEGATIVE    Bilirubin Urine NEGATIVE  NEGATIVE    Ketones, ur NEGATIVE   NEGATIVE (mg/dL)   Protein, ur NEGATIVE  NEGATIVE (mg/dL)   Urobilinogen, UA 0.2  0.0 - 1.0 (mg/dL)   Nitrite NEGATIVE  NEGATIVE    Leukocytes, UA NEGATIVE  NEGATIVE   URINE MICROSCOPIC-ADD ON     Status: Normal   Collection Time   11/26/11  1:38 PM      Component Value Range   Squamous Epithelial / LPF RARE  RARE    WBC, UA 0-2  <3 (WBC/hpf)   RBC / HPF TOO NUMEROUS TO COUNT  <3 (RBC/hpf)   Bacteria, UA RARE  RARE    Urine-Other TRICHOMONAS PRESENT    CBC     Status: Abnormal   Collection Time   11/26/11  1:45 PM      Component Value Range   WBC 6.3  4.0 - 10.5 (K/uL)   RBC 3.98  3.87 - 5.11 (MIL/uL)   Hemoglobin 11.0 (*) 12.0 - 15.0 (g/dL)   HCT 96.0 (*) 45.4 - 46.0 (%)   MCV 87.2  78.0 - 100.0 (fL)   MCH 27.6  26.0 - 34.0 (pg)   MCHC 31.7  30.0 - 36.0 (g/dL)   RDW 09.8  11.9 - 14.7 (%)   Platelets 346  150 - 400 (K/uL)   MDM Flagyl 2 g and Ibuprofen 600 mg PO given in MAU  US Transvaginal Non-ob  11/26/2011  *RADIOLOGY REPORT*  Clinical Data: Irregular bleeding  TRANSABDOMINAL AND TRANSVAGINAL ULTRASOUND OF PELVIS Technique:  Both transabdominal and transvaginal ultrasound examinations of the pelvis were performed. Transabdominal technique was performed for global imaging of the pelvis including uterus, ovaries, adnexal regions, and pelvic cul-de-sac.  Comparison: None.   It was necessary to proceed with endovaginal exam following the transabdominal exam to visualize the endometrium.  Findings:  The uterus is normal in size, shape and echotexture, measuring 6.6 x 3.5 x 4.6 cm.  The endometrial stripe is thin and homogeneous, measuring 4.6 mm in width.  Both ovaries have a normal size and appearance.  The right ovary measures 3.4 x 2.2 x 2.9 cm, and the left ovary measures 2.9 x 2.8 x 2.0 cm.  There are no adnexal masses.  A small amount of simple free pelvic fluid is present which is a nonspecific finding in a patient this age.  IMPRESSION: Normal pelvic ultrasound.  Original Report  Authenticated By: Brandon Melnick, M.D.   US Pelvis Complete  11/26/2011  *RADIOLOGY REPORT*  Clinical Data: Irregular bleeding  TRANSABDOMINAL AND TRANSVAGINAL ULTRASOUND OF PELVIS Technique:  Both transabdominal and transvaginal ultrasound examinations of the pelvis were performed. Transabdominal technique was performed for global imaging of the pelvis including uterus, ovaries, adnexal  regions, and pelvic cul-de-sac.  Comparison: None.   It was necessary to proceed with endovaginal exam following the transabdominal exam to visualize the endometrium.  Findings:  The uterus is normal in size, shape and echotexture, measuring 6.6 x 3.5 x 4.6 cm.  The endometrial stripe is thin and homogeneous, measuring 4.6 mm in width.  Both ovaries have a normal size and appearance.  The right ovary measures 3.4 x 2.2 x 2.9 cm, and the left ovary measures 2.9 x 2.8 x 2.0 cm.  There are no adnexal masses.  A small amount of simple free pelvic fluid is present which is a nonspecific finding in a patient this age.  IMPRESSION: Normal pelvic ultrasound.  Original Report Authenticated By: Brandon Melnick, M.D.    Assessment and Plan  A: Abnormal uterine bleeding Trichomonas   P: D/C home with bleeding precautions Encourage treatment of partner for Trichomonas at health department Sprintec OCPs x1 month for regulation of menstrual cycle F/U with Gyn provider for further contraception/management of irregular bleeding Return to MAU as needed  LEFTWICH-KIRBY, Levonte Molina 11/26/2011, 1:58 PM

## 2011-11-26 NOTE — MAU Note (Signed)
3rd period this mon.  Started on Fr.1st episode started 03/05, lasted 5-7 days- when expected period; 2nd episode was a wk later- lasted 7 days, off a wk and started again.  Shooting pain on left side, started with first period, has a cyst on cervix. (HPR).  Is not light headed.  Denies n/v/d or problems with urination.

## 2011-11-26 NOTE — MAU Provider Note (Signed)
Chart reviewed and agree with management and plan.  

## 2011-11-26 NOTE — Discharge Instructions (Signed)
Abnormal Uterine Bleeding Abnormal uterine bleeding can have many causes. Some cases are simply treated, while others are more serious. There are several kinds of bleeding that is considered abnormal, including:  Bleeding between periods.   Bleeding after sexual intercourse.   Spotting anytime in the menstrual cycle.   Bleeding heavier or more than normal.   Bleeding after menopause.  CAUSES  There are many causes of abnormal uterine bleeding. It can be present in teenagers, pregnant women, women during their reproductive years, and women who have reached menopause. Your caregiver will look for the more common causes depending on your age, signs, symptoms and your particular circumstance. Most cases are not serious and can be treated. Even the more serious causes, like cancer of the female organs, can be treated adequately if found in the early stages. That is why all types of bleeding should be evaluated and treated as soon as possible. DIAGNOSIS  Diagnosing the cause may take several kinds of tests. Your caregiver may:  Take a complete history of the type of bleeding.   Perform a complete physical exam and Pap smear.   Take an ultrasound on the abdomen showing a picture of the female organs and the pelvis.   Inject dye into the uterus and Fallopian tubes and X-ray them (hysterosalpingogram).   Place fluid in the uterus and do an ultrasound (sonohysterogrqphy).   Take a CT scan to examine the female organs and pelvis.   Take an MRI to examine the female organs and pelvis. There is no X-ray involved with this procedure.   Look inside the uterus with a telescope that has a light at the end (hysteroscopy).   Scrap the inside of the uterus to get tissue to examine (Dilatation and Curettage, D&C).   Look into the pelvis with a telescope that has a light at the end (laparoscopy). This is done through a very small cut (incision) in the abdomen.  TREATMENT  Treatment will depend on the  cause of the abnormal bleeding. It can include:  Doing nothing to allow the problem to take care of itself over time.   Hormone treatment.   Birth control pills.   Treating the medical condition causing the problem.   Laparoscopy.   Major or minor surgery   Destroying the lining of the uterus with electrical currant, laser, freezing or heat (uterine ablation).  HOME CARE INSTRUCTIONS   Follow your caregiver's recommendation on how to treat your problem.   See your caregiver if you missed a menstrual period and think you may be pregnant.   If you are bleeding heavily, count the number of pads/tampons you use and how often you have to change them. Tell this to your caregiver.   Avoid sexual intercourse until the problem is controlled.  SEEK MEDICAL CARE IF:   You have any kind of abnormal bleeding mentioned above.   You feel dizzy at times.   You are 26 years old and have not had a menstrual period yet.  SEEK IMMEDIATE MEDICAL CARE IF:   You pass out.   You are changing pads/tampons every 15 to 30 minutes.   You have belly (abdominal) pain.   You have a temperature of 100 F (37.8 C) or higher.   You become sweaty or weak.   You are passing large blood clots from the vagina.   You start to feel sick to your stomach (nauseous) and throw up (vomit).  Document Released: 08/12/2005 Document Revised: 08/01/2011 Document Reviewed: 01/05/2009 ExitCare   Patient Information 2012 ExitCare, LLCTrichomoniasis Trichomoniasis is an infection, caused by the Trichomonas organism, that affects both women and men. In women, the outer female genitalia and the vagina are affected. In men, the penis is mainly affected, but the prostate and other reproductive organs can also be involved. Trichomoniasis is a sexually transmitted disease (STD) and is most often passed to another person through sexual contact. The majority of people who get trichomoniasis do so from a sexual encounter and are  also at risk for other STDs. CAUSES   Sexual intercourse with an infected partner.   It can be present in swimming pools or hot tubs.  SYMPTOMS   Abnormal gray-green frothy vaginal discharge in women.   Vaginal itching and irritation in women.   Itching and irritation of the area outside the vagina in women.   Penile discharge with or without pain in males.   Inflammation of the urethra (urethritis), causing painful urination.   Bleeding after sexual intercourse.  RELATED COMPLICATIONS  Pelvic inflammatory disease.   Infection of the uterus (endometritis).   Infertility.   Tubal (ectopic) pregnancy.   It can be associated with other STDs, including gonorrhea and chlamydia, hepatitis B, and HIV.  COMPLICATIONS DURING PREGNANCY  Early (premature) delivery.   Premature rupture of the membranes (PROM).   Low birth weight.  DIAGNOSIS   Visualization of Trichomonas under the microscope from the vagina discharge.   Ph of the vagina greater than 4.5, tested with a test tape.   Trich Rapid Test.   Culture of the organism, but this is not usually needed.   It may be found on a Pap test.   Having a "strawberry cervix,"which means the cervix looks very red like a strawberry.  TREATMENT   You may be given medication to fight the infection. Inform your caregiver if you could be or are pregnant. Some medications used to treat the infection should not be taken during pregnancy.   Over-the-counter medications or creams to decrease itching or irritation may be recommended.   Your sexual partner will need to be treated if infected.  HOME CARE INSTRUCTIONS   Take all medication prescribed by your caregiver.   Take over-the-counter medication for itching or irritation as directed by your caregiver.   Do not have sexual intercourse while you have the infection.   Do not douche or wear tampons.   Discuss your infection with your partner, as your partner may have acquired  the infection from you. Or, your partner may have been the person who transmitted the infection to you.   Have your sex partner examined and treated if necessary.   Practice safe, informed, and protected sex.   See your caregiver for other STD testing.  SEEK MEDICAL CARE IF:   You still have symptoms after you finish the medication.   You have an oral temperature above 102 F (38.9 C).   You develop belly (abdominal) pain.   You have pain when you urinate.   You have bleeding after sexual intercourse.   You develop a rash.   The medication makes you sick or makes you throw up (vomit).  Document Released: 02/05/2001 Document Revised: 08/01/2011 Document Reviewed: 03/03/2009 ExitCare Patient Information 2012 [employer].. 

## 2011-11-27 LAB — GC/CHLAMYDIA PROBE AMP, GENITAL
Chlamydia, DNA Probe: NEGATIVE
GC Probe Amp, Genital: NEGATIVE

## 2012-03-19 ENCOUNTER — Emergency Department: Payer: Self-pay | Admitting: Emergency Medicine

## 2012-06-15 ENCOUNTER — Emergency Department: Payer: Self-pay | Admitting: Emergency Medicine

## 2012-06-17 ENCOUNTER — Emergency Department: Payer: Self-pay | Admitting: Emergency Medicine

## 2012-06-19 LAB — WOUND AEROBIC CULTURE

## 2012-09-15 ENCOUNTER — Emergency Department: Payer: Self-pay | Admitting: Emergency Medicine

## 2012-09-15 LAB — COMPREHENSIVE METABOLIC PANEL
Albumin: 3.5 g/dL (ref 3.4–5.0)
Alkaline Phosphatase: 79 U/L (ref 50–136)
BUN: 10 mg/dL (ref 7–18)
Bilirubin,Total: 0.4 mg/dL (ref 0.2–1.0)
Calcium, Total: 8.3 mg/dL — ABNORMAL LOW (ref 8.5–10.1)
Co2: 30 mmol/L (ref 21–32)
Creatinine: 0.81 mg/dL (ref 0.60–1.30)
EGFR (African American): >60
EGFR (Non-African Amer.): >60
Osmolality: 276 (ref 275–301)
SGOT(AST): 29 U/L (ref 15–37)
SGPT (ALT): 37 U/L (ref 12–78)
Sodium: 139 mmol/L (ref 136–145)
Total Protein: 7.9 g/dL (ref 6.4–8.2)

## 2012-09-15 LAB — URINALYSIS, COMPLETE
Bilirubin,UR: NEGATIVE
Glucose,UR: NEGATIVE mg/dL (ref 0–75)
Ketone: NEGATIVE
Leukocyte Esterase: NEGATIVE
Ph: 6 (ref 4.5–8.0)
Protein: NEGATIVE
RBC,UR: NONE SEEN /HPF (ref 0–5)
WBC UR: 1 /HPF (ref 0–5)

## 2012-09-15 LAB — CBC
HCT: 35.2 % (ref 35.0–47.0)
HGB: 11.2 g/dL — ABNORMAL LOW (ref 12.0–16.0)
MCH: 26.6 pg (ref 26.0–34.0)
MCHC: 31.8 g/dL — ABNORMAL LOW (ref 32.0–36.0)
Platelet: 324 10*3/uL (ref 150–440)
RDW: 15.4 % — ABNORMAL HIGH (ref 11.5–14.5)

## 2012-09-15 LAB — WET PREP, GENITAL

## 2012-09-15 LAB — PREGNANCY, URINE: Pregnancy Test, Urine: NEGATIVE m[IU]/mL

## 2013-04-01 ENCOUNTER — Encounter (HOSPITAL_COMMUNITY): Payer: Self-pay | Admitting: Emergency Medicine

## 2013-04-01 ENCOUNTER — Emergency Department (HOSPITAL_COMMUNITY)
Admission: EM | Admit: 2013-04-01 | Discharge: 2013-04-02 | Disposition: A | Discharge status: Home / Self Care | Payer: Medicaid Other | Attending: Emergency Medicine | Admitting: Emergency Medicine

## 2013-04-01 ENCOUNTER — Emergency Department (HOSPITAL_COMMUNITY): Payer: Medicaid Other

## 2013-04-01 DIAGNOSIS — Z8742 Personal history of other diseases of the female genital tract: Secondary | ICD-10-CM | POA: Insufficient documentation

## 2013-04-01 DIAGNOSIS — F17200 Nicotine dependence, unspecified, uncomplicated: Secondary | ICD-10-CM | POA: Insufficient documentation

## 2013-04-01 DIAGNOSIS — N390 Urinary tract infection, site not specified: Secondary | ICD-10-CM

## 2013-04-01 DIAGNOSIS — R350 Frequency of micturition: Secondary | ICD-10-CM | POA: Insufficient documentation

## 2013-04-01 DIAGNOSIS — N949 Unspecified condition associated with female genital organs and menstrual cycle: Secondary | ICD-10-CM | POA: Insufficient documentation

## 2013-04-01 DIAGNOSIS — R109 Unspecified abdominal pain: Secondary | ICD-10-CM | POA: Insufficient documentation

## 2013-04-01 DIAGNOSIS — Z3202 Encounter for pregnancy test, result negative: Secondary | ICD-10-CM | POA: Insufficient documentation

## 2013-04-01 DIAGNOSIS — A5900 Urogenital trichomoniasis, unspecified: Secondary | ICD-10-CM

## 2013-04-01 DIAGNOSIS — A5901 Trichomonal vulvovaginitis: Secondary | ICD-10-CM | POA: Insufficient documentation

## 2013-04-01 DIAGNOSIS — Z87448 Personal history of other diseases of urinary system: Secondary | ICD-10-CM | POA: Insufficient documentation

## 2013-04-01 DIAGNOSIS — Z8619 Personal history of other infectious and parasitic diseases: Secondary | ICD-10-CM | POA: Insufficient documentation

## 2013-04-01 DIAGNOSIS — M549 Dorsalgia, unspecified: Secondary | ICD-10-CM | POA: Insufficient documentation

## 2013-04-01 DIAGNOSIS — N938 Other specified abnormal uterine and vaginal bleeding: Secondary | ICD-10-CM

## 2013-04-01 DIAGNOSIS — R5381 Other malaise: Secondary | ICD-10-CM | POA: Insufficient documentation

## 2013-04-01 LAB — URINE MICROSCOPIC-ADD ON

## 2013-04-01 LAB — POCT I-STAT, CHEM 8
Creatinine, Ser: 0.9 mg/dL (ref 0.50–1.10)
HCT: 36 % (ref 36.0–46.0)
Hemoglobin: 12.2 g/dL (ref 12.0–15.0)
Potassium: 3.9 mEq/L (ref 3.5–5.1)
Sodium: 139 mEq/L (ref 135–145)
TCO2: 25 mmol/L (ref 0–100)

## 2013-04-01 LAB — URINALYSIS, ROUTINE W REFLEX MICROSCOPIC
Glucose, UA: NEGATIVE mg/dL
Ketones, ur: 15 mg/dL — AB
Protein, ur: 30 mg/dL — AB
Urobilinogen, UA: 1 mg/dL (ref 0.0–1.0)

## 2013-04-01 LAB — WET PREP, GENITAL: Clue Cells Wet Prep HPF POC: NONE SEEN

## 2013-04-01 MED ORDER — HYDROCODONE-ACETAMINOPHEN 5-325 MG PO TABS
1.0000 | ORAL_TABLET | Freq: Once | ORAL | Status: AC
  Administered 2013-04-01: 1 via ORAL
  Filled 2013-04-01: qty 1

## 2013-04-01 MED ORDER — DIAZEPAM 5 MG PO TABS
5.0000 mg | ORAL_TABLET | Freq: Once | ORAL | Status: AC
  Administered 2013-04-01: 5 mg via ORAL
  Filled 2013-04-01: qty 1

## 2013-04-01 MED ORDER — METRONIDAZOLE 500 MG PO TABS
2000.0000 mg | ORAL_TABLET | Freq: Once | ORAL | Status: AC
  Administered 2013-04-01: 2000 mg via ORAL
  Filled 2013-04-01: qty 4

## 2013-04-01 MED ORDER — ONDANSETRON 4 MG PO TBDP
ORAL_TABLET | ORAL | Status: AC
  Administered 2013-04-01: 8 mg
  Filled 2013-04-01: qty 2

## 2013-04-01 MED ORDER — OXYCODONE-ACETAMINOPHEN 5-325 MG PO TABS
1.0000 | ORAL_TABLET | Freq: Once | ORAL | Status: AC
  Administered 2013-04-01: 1 via ORAL
  Filled 2013-04-01: qty 1

## 2013-04-01 NOTE — ED Notes (Signed)
Pt here with c/o back pain and vaginal bleeding that has been ongoing for about 3 weeks , pt thinks her iud  Is out of place . No discharge or burning on urination

## 2013-04-01 NOTE — ED Provider Notes (Signed)
CSN: [id number]     Arrival date & time 04/01/13  1610 History     First MD Initiated Contact with Patient 04/01/13 2003     Chief Complaint  Patient presents with  . Vaginal Bleeding  . Back Pain   (Consider location/radiation/quality/duration/timing/severity/associated sxs/prior Treatment) HPI History provided by pt.   27yo healthy F presents w/ vaginal bleeding x 3 weeks.  Flow can be light or heavy, and has used up to 7 tampons a day.  Associated w/ fatigue, increased urinary frequency and lower abdominal cramping.  Denies fever, N/V, change in bowels, other GU sx.  [facility] of [facility], bleed for 2 weeks and was started on OCPs.  Had a normal period last month, and then started to bleed again 3 weeks ago.   Past Medical History  Diagnosis Date  . Pyelonephritis   . Chlamydia   . Cervical cyst    Past Surgical History  Procedure Laterality Date  . Cesarean section     No family history on file. History  Substance Use Topics  . Smoking status: Current Every Day Smoker -- 0.50 packs/day for 9 years    Types: Cigarettes  . Smokeless tobacco: Not on file  . Alcohol Use: Yes     Comment: socially   OB History   Grav Para Term Preterm Abortions TAB SAB Ect Mult Living   1 1 1       1      Review of Systems  All other systems reviewed and are negative.    Allergies  Review of patient's allergies indicates no known allergies.  Home Medications   Current Outpatient Rx  Name  Route  Sig  Dispense  Refill  . Multiple Vitamins-Minerals (HM MULTIVITAMIN ADULT GUMMY PO)   Oral   Take 2 tablets by mouth daily.         . norgestimate-ethinyl estradiol (ORTHO-CYCLEN,SPRINTEC,PREVIFEM) 0.25-35 MG-MCG tablet   Oral   Take 1 tablet by mouth daily.   1 Package   0   . sertraline (ZOLOFT) 50 MG tablet   Oral   Take 50 mg by mouth daily.          BP 110/55  Pulse 69  Temp(Src) 98.8 F (37.1 C) (Oral)  Resp 16  SpO2 99% Physical Exam   Nursing note and vitals reviewed. Constitutional: She is oriented to person, place, and time. She appears well-developed and well-nourished. No distress.  HENT:  Head: Normocephalic and atraumatic.  Mouth/Throat: Oropharynx is clear and moist.  Eyes: Conjunctivae are normal.  Normal appearance  Neck: Normal range of motion.  Cardiovascular: Normal rate and regular rhythm.   Pulmonary/Chest: Effort normal and breath sounds normal. No respiratory distress.  Abdominal: Soft. Bowel sounds are normal. She exhibits no distension and no mass. There is no rebound and no guarding.  Obese.  Mild, diffuse lower abd tenderness, worst inferior to umbilicus.  Genitourinary:  Bilateral CVA tenderness reported.  Nml external genitalia.  Mild vaginal bleeding.  Cervix closed and appears nml. IUD strings are not visible.  No adnexal or cervical motion tenderess.    Musculoskeletal: Normal range of motion.  Neurological: She is alert and oriented to person, place, and time.  Skin: Skin is warm and dry. No rash noted.  Psychiatric: She has a normal mood and affect. Her behavior is normal.    ED Course   Procedures (including critical care time)  Labs Reviewed  WET PREP, GENITAL -  Abnormal; Notable for the following:    Trich, Wet Prep FEW (*)    WBC, Wet Prep HPF POC MODERATE (*)    All other components within normal limits  URINALYSIS, ROUTINE W REFLEX MICROSCOPIC - Abnormal; Notable for the following:    Color, Urine RED (*)    APPearance TURBID (*)    Hgb urine dipstick LARGE (*)    Bilirubin Urine SMALL (*)    Ketones, ur 15 (*)    Protein, ur 30 (*)    Leukocytes, UA MODERATE (*)    All other components within normal limits  URINE MICROSCOPIC-ADD ON - Abnormal; Notable for the following:    Squamous Epithelial / LPF FEW (*)    Bacteria, UA MANY (*)    All other components within normal limits  POCT I-STAT, CHEM 8 - Abnormal; Notable for the following:    Glucose, Bld 108 (*)     Calcium, Ion 1.24 (*)    All other components within normal limits  GC/CHLAMYDIA PROBE AMP  URINE CULTURE  PREGNANCY, URINE   Dg Chest 2 View  04/01/2013   *RADIOLOGY REPORT*  Clinical Data: Vaginal bleeding.  CHEST - 2 VIEW  Comparison: PA and lateral chest 01/08/2011.  Findings: Lungs clear.  Heart size normal.  No pneumothorax or pleural fluid.  IMPRESSION: Negative chest.   Original Report Authenticated By: Holley Dexter, M.D.   US Transvaginal Non-ob  04/01/2013   *RADIOLOGY REPORT*  Clinical Data: Dysfunctional uterine bleeding and lower abdominal pain.  TRANSABDOMINAL AND TRANSVAGINAL ULTRASOUND OF PELVIS Technique:  Both transabdominal and transvaginal ultrasound examinations of the pelvis were performed. Transabdominal technique was performed for global imaging of the pelvis including uterus, ovaries, adnexal regions, and pelvic cul-de-sac.  It was necessary to proceed with endovaginal exam following the transabdominal exam to visualize the endometrium.  Comparison:  None  Findings:  Uterus: Appears normal.  Endometrium: Measures 0.5 cm.  Tiny amount of fluid is seen in the superior most aspect of the endometrium.  There appears to be an IUD in place which is positioned in the lower uterine segment.  Right ovary:  Normal appearance/no adnexal mass  Left ovary: Normal appearance/no adnexal mass  Other findings: No free fluid  IMPRESSION: Although difficult to visualize, the patient's IUD appears to be positioned in the lower uterine segment.  Tiny amount of fluid within the upper endometrial canal also noted.   Original Report Authenticated By: Holley Dexter, M.D.   US Pelvis Complete  04/01/2013   *RADIOLOGY REPORT*  Clinical Data: Dysfunctional uterine bleeding and lower abdominal pain.  TRANSABDOMINAL AND TRANSVAGINAL ULTRASOUND OF PELVIS Technique:  Both transabdominal and transvaginal ultrasound examinations of the pelvis were performed. Transabdominal technique was performed for global  imaging of the pelvis including uterus, ovaries, adnexal regions, and pelvic cul-de-sac.  It was necessary to proceed with endovaginal exam following the transabdominal exam to visualize the endometrium.  Comparison:  None  Findings:  Uterus: Appears normal.  Endometrium: Measures 0.5 cm.  Tiny amount of fluid is seen in the superior most aspect of the endometrium.  There appears to be an IUD in place which is positioned in the lower uterine segment.  Right ovary:  Normal appearance/no adnexal mass  Left ovary: Normal appearance/no adnexal mass  Other findings: No free fluid  IMPRESSION: Although difficult to visualize, the patient's IUD appears to be positioned in the lower uterine segment.  Tiny amount of fluid within the upper endometrial canal also noted.   Original Report  Authenticated By: Holley Dexter, M.D.   1. UTI (lower urinary tract infection)   2. Dysfunctional uterine bleeding   3. Urogenital infection by trichomonas vaginalis     MDM  27yo healthy F presents w/ DUB.  [facility] end of may and proper position of IUD confirmed w/ [place].  Has had vaginal bleeding and lower abd cramping x 3wks.  Also c/o diffuse, non-trauamatic back pain x 2.5wks. On exam, afebrile, non-toxic appearing, no signs of anemia, abd soft/non-distended, mild lower abd ttp, vaginal bleeding, IUD strings not visible, otherwise unremarkable genitalia, mild thoracic spinal and paraspinal ttp, no NV deficits LEs.  Urine preg, U/A and H&H pending. Particularly because patient has no f/u at this time, transvaginal US ordered and is pending.  Suspect that back pain is muscular. Po valium/vicodin ordered for pain. 8:51 PM   Pt has a UTI, including trich.  Received 2g po flagyl.  Pain in back improved but then returned and is now localized right thoracic region.  Though patient is on birth control, I have low clinical suspicion for PE;  long duration of pain, and lack of tachypnea/tachycardia/signs of DVT  on exam.  CXR ordered to r/o PNA d/t recent cough and location of pain.  10:28 PM   CXR neg and [place] non-acute.  IUD in place.   Results discussed w/ pt.  Prescribed macrobid for UTI and valium/ibuprofen for back and lower abd pain.  Recommended f/u with Gyn asap.  Also recommended that her sexual partner be treated for trich.  Return precautions, including fever, lightheadedness, SOB, worsening thoracic or lower abd pain, syncope discussed.  Pt stable at time of discharge. 12:42 AM    Otilio Miu, PA-C 04/02/13 [phone]

## 2013-04-02 LAB — URINE CULTURE

## 2013-04-02 LAB — GC/CHLAMYDIA PROBE AMP
CT Probe RNA: NEGATIVE
GC Probe RNA: NEGATIVE

## 2013-04-02 MED ORDER — IBUPROFEN 800 MG PO TABS: 800.0000 mg | ORAL_TABLET | Freq: Three times a day (TID) | ORAL | Status: DC

## 2013-04-02 MED ORDER — DIAZEPAM 5 MG PO TABS: 5.0000 mg | ORAL_TABLET | Freq: Two times a day (BID) | ORAL | Status: DC

## 2013-04-02 MED ORDER — NITROFURANTOIN MONOHYD MACRO 100 MG PO CAPS: 100.0000 mg | ORAL_CAPSULE | Freq: Two times a day (BID) | ORAL | Status: DC

## 2013-04-04 NOTE — ED Provider Notes (Signed)
Medical screening examination/treatment/procedure(s) were performed by non-physician practitioner and as supervising physician I was immediately available for consultation/collaboration.  Derwood Kaplan, MD 04/04/13 [phone]

## 2013-04-08 ENCOUNTER — Emergency Department (HOSPITAL_COMMUNITY)
Admission: EM | Admit: 2013-04-08 | Discharge: 2013-04-08 | Disposition: A | Discharge status: Home / Self Care | Payer: Medicaid Other | Source: Home / Self Care

## 2013-04-08 ENCOUNTER — Encounter (HOSPITAL_COMMUNITY): Payer: Self-pay | Admitting: Emergency Medicine

## 2013-04-08 DIAGNOSIS — L0390 Cellulitis, unspecified: Secondary | ICD-10-CM

## 2013-04-08 DIAGNOSIS — N938 Other specified abnormal uterine and vaginal bleeding: Secondary | ICD-10-CM

## 2013-04-08 DIAGNOSIS — G5710 Meralgia paresthetica, unspecified lower limb: Secondary | ICD-10-CM

## 2013-04-08 DIAGNOSIS — N949 Unspecified condition associated with female genital organs and menstrual cycle: Secondary | ICD-10-CM

## 2013-04-08 DIAGNOSIS — G5712 Meralgia paresthetica, left lower limb: Secondary | ICD-10-CM

## 2013-04-08 DIAGNOSIS — L0291 Cutaneous abscess, unspecified: Secondary | ICD-10-CM

## 2013-04-08 DIAGNOSIS — M546 Pain in thoracic spine: Secondary | ICD-10-CM

## 2013-04-08 LAB — POCT PREGNANCY, URINE: Preg Test, Ur: NEGATIVE

## 2013-04-08 LAB — POCT URINALYSIS DIP (DEVICE)
Nitrite: NEGATIVE
pH: 6 (ref 5.0–8.0)

## 2013-04-08 MED ORDER — AMITRIPTYLINE HCL 25 MG PO TABS: 25.0000 mg | ORAL_TABLET | Freq: Every day | ORAL | Status: DC

## 2013-04-08 MED ORDER — TETANUS-DIPHTH-ACELL PERTUSSIS 5-2.5-18.5 LF-MCG/0.5 IM SUSP
INTRAMUSCULAR | Status: AC
  Filled 2013-04-08: qty 0.5

## 2013-04-08 MED ORDER — MELOXICAM 15 MG PO TABS: 15.0000 mg | ORAL_TABLET | Freq: Every day | ORAL | Status: DC | PRN

## 2013-04-08 MED ORDER — DOXYCYCLINE HYCLATE 100 MG PO CAPS: 100.0000 mg | ORAL_CAPSULE | Freq: Two times a day (BID) | ORAL | Status: DC

## 2013-04-08 NOTE — ED Notes (Signed)
Pt c/o abscess onset Monday Denies: fevers, drainage sxs include: tenderness Alert w/no signs of acute distress

## 2013-04-08 NOTE — ED Provider Notes (Signed)
Emma Green is a 27 y.o. female who presents to Urgent Care today for multiple issues 1) left axilla saline this or abscess. This occurred over the past several days. She has pain and tenderness in the left axilla. This is worse with activity better with rest. She denies any fevers or chills radiating arm pain or weakness. No medications tried for this. 2) back pain: Patient notes thoracic back pain present for the last 3 or 4 weeks. She denies any injury. The pain is worse with arm motion and neck motion. She denies any radiating pain weakness or numbness in her upper extremities. The pain is moderate. The pain does not radiate. 3) left lateral leg numbness: Off-and-on for several months patient has noted numbness and tingling in the lateral aspect of her left thigh. There is no exacerbating factor. She denies any weakness fevers or chills. 4) vaginal bleeding: Patient has had 4 weeks of unremitting vaginal bleeding despite being on oral combine contraception and Mirena IUD. She was seen by the emergency room or pelvic ultrasound was essentially normal. She does not have established appointment with gynecology. No syncopal events dizziness or lightheadedness.    PMH reviewed. Healthy otherwise History  Substance Use Topics  . Smoking status: Current Every Day Smoker -- 0.50 packs/day for 9 years    Types: Cigarettes  . Smokeless tobacco: Not on file  . Alcohol Use: Yes     Comment: socially   ROS as above Medications reviewed. No current facility-administered medications for this encounter.   Current Outpatient Prescriptions  Medication Sig Dispense Refill  . amitriptyline (ELAVIL) 25 MG tablet Take 1 tablet (25 mg total) by mouth at bedtime.  30 tablet  0  . doxycycline (VIBRAMYCIN) 100 MG capsule Take 1 capsule (100 mg total) by mouth 2 (two) times daily.  20 capsule  0  . meloxicam (MOBIC) 15 MG tablet Take 1 tablet (15 mg total) by mouth daily as needed for pain.  14 tablet  0   . Multiple Vitamins-Minerals (HM MULTIVITAMIN ADULT GUMMY PO) Take 2 tablets by mouth daily.      . norgestimate-ethinyl estradiol (ORTHO-CYCLEN,SPRINTEC,PREVIFEM) 0.25-35 MG-MCG tablet Take 1 tablet by mouth daily.  1 Package  0  . sertraline (ZOLOFT) 50 MG tablet Take 50 mg by mouth daily.        Exam:  BP 111/80  Pulse 82  Temp(Src) 98 F (36.7 C) (Oral)  Resp 18  SpO2 100%  LMP 03/01/2013 Gen: Well NAD HEENT: EOMI,  MMM Lungs: CTABL Nl WOB Heart: RRR no MRG Abd: NABS, NT, ND Exts: Non edematous BL  LE, warm and well perfused.  Left axilla: 1 x 3 cm area of induration with no fluctuance. Tender to palpation.  Back: Nontender spinal midline. Tender palpation bilateral thoracic paraspinal muscles. Normal neck range of motion negative Spurling's test bilateral upper extremity grip strength capillary refill and sensation are intact. Legs: Normal-appearing sensation is intact distally. Slightly decreased sensation lateral upper leg.   Results for orders placed during the hospital encounter of 04/08/13 (from the past 24 hour(s))  POCT URINALYSIS DIP (DEVICE)     Status: Abnormal   Collection Time    04/08/13  7:40 PM      Result Value Range   Glucose, UA NEGATIVE  NEGATIVE mg/dL   Bilirubin Urine MODERATE (*) NEGATIVE   Ketones, ur 80 (*) NEGATIVE mg/dL   Specific Gravity, Urine >=1.030  1.005 - 1.030   Hgb urine dipstick LARGE (*) NEGATIVE  pH 6.0  5.0 - 8.0   Protein, ur 30 (*) NEGATIVE mg/dL   Urobilinogen, UA 0.2  0.0 - 1.0 mg/dL   Nitrite NEGATIVE  NEGATIVE   Leukocytes, UA TRACE (*) NEGATIVE  POCT PREGNANCY, URINE     Status: None   Collection Time    04/08/13  7:41 PM      Result Value Range   Preg Test, Ur NEGATIVE  NEGATIVE   No results found.  Procedure incision and drainage I&D:  Percent obtained and timeout performed. Left axillary induration located in skin cleaned with alcohol. 4 mL of 2% lidocaine with epinephrine were injected in the skin overlying the  induration achieving good anesthesia. A 1 cm cut was made into the area of maximal induration. The wound was about 1 cm deep and was explored revealing no pockets of pus.  The procedure was discontinued at this point.  Assessment and Plan: 27 y.o. female with   1) cellulitis left upper arm. No evidence of focal fluctuance indicating abscess. Likely due to cellulitis. Plan to treat with doxycycline. Followup if not improving.  2) thoracic back pain. Plan to treat with melo xicam. Followup with orthopedics if not improving.  3) Meralgia paresthetica: We will use amitriptyline at night and followup at sports medicine. 4) vaginal bleeding: Followup with gynecology  Discussed warning signs or symptoms. Please see discharge instructions. Patient expresses understanding.      Rodolph Bong, MD 04/08/13 [phone]

## 2013-04-12 LAB — CULTURE, ROUTINE-ABSCESS: Culture: NO GROWTH

## 2013-04-19 ENCOUNTER — Ambulatory Visit: Payer: Medicaid Other | Admitting: Dietician

## 2013-04-28 ENCOUNTER — Emergency Department (HOSPITAL_COMMUNITY): Payer: Medicaid Other

## 2013-04-28 ENCOUNTER — Encounter (HOSPITAL_COMMUNITY): Payer: Self-pay | Admitting: Emergency Medicine

## 2013-04-28 ENCOUNTER — Emergency Department (HOSPITAL_COMMUNITY)
Admission: EM | Admit: 2013-04-28 | Discharge: 2013-04-28 | Disposition: A | Discharge status: Home / Self Care | Payer: Medicaid Other | Attending: Emergency Medicine | Admitting: Emergency Medicine

## 2013-04-28 DIAGNOSIS — R05 Cough: Secondary | ICD-10-CM | POA: Insufficient documentation

## 2013-04-28 DIAGNOSIS — F17200 Nicotine dependence, unspecified, uncomplicated: Secondary | ICD-10-CM | POA: Insufficient documentation

## 2013-04-28 DIAGNOSIS — R52 Pain, unspecified: Secondary | ICD-10-CM | POA: Insufficient documentation

## 2013-04-28 DIAGNOSIS — R000 Tachycardia, unspecified: Secondary | ICD-10-CM | POA: Insufficient documentation

## 2013-04-28 DIAGNOSIS — J020 Streptococcal pharyngitis: Secondary | ICD-10-CM

## 2013-04-28 DIAGNOSIS — R059 Cough, unspecified: Secondary | ICD-10-CM | POA: Insufficient documentation

## 2013-04-28 DIAGNOSIS — Z87448 Personal history of other diseases of urinary system: Secondary | ICD-10-CM | POA: Insufficient documentation

## 2013-04-28 DIAGNOSIS — Z8619 Personal history of other infectious and parasitic diseases: Secondary | ICD-10-CM | POA: Insufficient documentation

## 2013-04-28 DIAGNOSIS — Z8742 Personal history of other diseases of the female genital tract: Secondary | ICD-10-CM | POA: Insufficient documentation

## 2013-04-28 DIAGNOSIS — Z79899 Other long term (current) drug therapy: Secondary | ICD-10-CM | POA: Insufficient documentation

## 2013-04-28 LAB — RAPID STREP SCREEN (MED CTR MEBANE ONLY): Streptococcus, Group A Screen (Direct): POSITIVE — AB

## 2013-04-28 MED ORDER — PENICILLIN G BENZATHINE 1200000 UNIT/2ML IM SUSP
1.2000 10*6.[IU] | Freq: Once | INTRAMUSCULAR | Status: AC
  Administered 2013-04-28: 1.2 10*6.[IU] via INTRAMUSCULAR
  Filled 2013-04-28: qty 2

## 2013-04-28 MED ORDER — DEXAMETHASONE SODIUM PHOSPHATE 4 MG/ML IJ SOLN: 12.0000 mg | Freq: Once | INTRAMUSCULAR | Status: DC

## 2013-04-28 MED ORDER — DEXAMETHASONE SODIUM PHOSPHATE 10 MG/ML IJ SOLN
20.0000 mg | Freq: Once | INTRAMUSCULAR | Status: AC
  Administered 2013-04-28: 20 mg via INTRAMUSCULAR
  Filled 2013-04-28: qty 2

## 2013-04-28 NOTE — ED Notes (Signed)
Pt here for sore throat x 3 days with fever and body aches; pt sts some cough

## 2013-04-28 NOTE — ED Provider Notes (Signed)
CSN: [id number]     Arrival date & time 04/28/13  1507 History   First MD Initiated Contact with Patient 04/28/13 1655     Chief Complaint  Patient presents with  . Sore Throat  . Generalized Body Aches  . Cough   (Consider location/radiation/quality/duration/timing/severity/associated sxs/prior Treatment) HPI Comments: Patient is a 27 year old female who presents with a 3 day history of sore throat. Patient reports gradual onset and progressively worsening sharp, severe throat pain. The pain is constant and made worse with swallowing. The pain is localized to the patient's throat and equal on both sides. Nothing alleviates the pain. The patient has not tried anything for symptom relief. Patient reports associated subjective fever, cervical adenopathy, and non productive cough. Patient denies headache, visual changes, sinus congestion, difficulty breathing, chest pain, SOB, abdominal pain, NVD.     Patient is a 27 y.o. female presenting with pharyngitis and cough.  Sore Throat Associated symptoms include coughing, myalgias and a sore throat.  Cough Associated symptoms: myalgias and sore throat     Past Medical History  Diagnosis Date  . Pyelonephritis   . Chlamydia   . Cervical cyst    Past Surgical History  Procedure Laterality Date  . Cesarean section     History reviewed. No pertinent family history. History  Substance Use Topics  . Smoking status: Current Every Day Smoker -- 0.50 packs/day for 9 years    Types: Cigarettes  . Smokeless tobacco: Not on file  . Alcohol Use: Yes     Comment: socially   OB History   Grav Para Term Preterm Abortions TAB SAB Ect Mult Living   1 1 1       1      Review of Systems  HENT: Positive for sore throat.   Respiratory: Positive for cough.   Musculoskeletal: Positive for myalgias.  All other systems reviewed and are negative.    Allergies  Review of patient's allergies indicates no known allergies.  Home Medications    Current Outpatient Rx  Name  Route  Sig  Dispense  Refill  . DM-Doxylamine-Acetaminophen (NYQUIL COLD & FLU PO)   Oral   Take 30 mLs by mouth 3 (three) times daily as needed (for cold).         Marland Kitchen guaiFENesin (ROBITUSSIN) 100 MG/5ML liquid   Oral   Take 600 mg by mouth 3 (three) times daily as needed for congestion.          . Multiple Vitamins-Minerals (HM MULTIVITAMIN ADULT GUMMY PO)   Oral   Take 2 tablets by mouth daily.          BP 130/74  Pulse 103  Temp(Src) 98.7 F (37.1 C) (Oral)  Resp 20  Ht 5\' 1"  (1.549 m)  Wt 211 lb 11.2 oz (96.026 kg)  BMI 40.02 kg/m2  SpO2 98%  LMP 04/01/2013 Physical Exam  Nursing note and vitals reviewed. Constitutional: She is oriented to person, place, and time. She appears well-developed and well-nourished. No distress.  HENT:  Head: Normocephalic and atraumatic.  Bilateral tonsillar edema, erythema and exudate noted.   Eyes: Conjunctivae and EOM are normal. Pupils are equal, round, and reactive to light.  Neck: Normal range of motion.  Cardiovascular: Regular rhythm.  Exam reveals no gallop and no friction rub.   No murmur heard. tachycardic  Pulmonary/Chest: Effort normal and breath sounds normal. She has no wheezes. She has no rales. She exhibits no tenderness.  Musculoskeletal: Normal range of motion.  Lymphadenopathy:    She has cervical adenopathy.  Neurological: She is alert and oriented to person, place, and time. Coordination normal.  Speech is goal-oriented. Moves limbs without ataxia.   Skin: Skin is warm and dry.  Psychiatric: She has a normal mood and affect. Her behavior is normal.    ED Course  Procedures (including critical care time) Labs Review Labs Reviewed  RAPID STREP SCREEN - Abnormal; Notable for the following:    Streptococcus, Group A Screen (Direct) POSITIVE (*)    All other components within normal limits   Imaging Review Dg Chest 2 View  04/28/2013   *RADIOLOGY REPORT*  Clinical Data: Sore  throat, generalized body aches, cough, chest pain  CHEST - 2 VIEW  Comparison: 04/01/2013; 09/03/2010; chest CT - 09/03/2010  Findings:  Grossly unchanged enlarged cardiac silhouette. Normal mediastinal contours.  No focal parenchymal opacities.  No pleural effusion or pneumothorax.  There is persistent mild elevation/eventration of the right hemidiaphragm.  No evidence of edema.  No acute osseous abnormality.  IMPRESSION: Stable cardiomegaly without acute cardiopulmonary disease.   Original Report Authenticated By: Tacey Ruiz, MD    MDM   1. Strep pharyngitis     4:59 PM Patient's rapid strep positive. Chest xray unremarkable. Patient will have IM Pen G and decadron. Vitals stable and patient slightly tachycardic. No further evaluation needed at this time. Patient instructed to return with worsening or concerning symptoms.    Emilia Beck, PA-C 04/28/13 1702

## 2013-05-01 NOTE — ED Provider Notes (Signed)
Medical screening examination/treatment/procedure(s) were performed by non-physician practitioner and as supervising physician I was immediately available for consultation/collaboration.  Candyce Churn, MD 05/01/13 [phone]

## 2013-07-08 ENCOUNTER — Encounter (HOSPITAL_COMMUNITY): Payer: Self-pay | Admitting: Emergency Medicine

## 2013-07-08 ENCOUNTER — Observation Stay (HOSPITAL_COMMUNITY)
Admission: EM | Admit: 2013-07-08 | Discharge: 2013-07-10 | Disposition: A | Discharge status: Home / Self Care | Payer: Medicaid Other | Attending: Internal Medicine | Admitting: Internal Medicine

## 2013-07-08 ENCOUNTER — Observation Stay (HOSPITAL_COMMUNITY): Payer: Medicaid Other

## 2013-07-08 DIAGNOSIS — N390 Urinary tract infection, site not specified: Secondary | ICD-10-CM

## 2013-07-08 DIAGNOSIS — R319 Hematuria, unspecified: Secondary | ICD-10-CM | POA: Diagnosis present

## 2013-07-08 DIAGNOSIS — N760 Acute vaginitis: Secondary | ICD-10-CM | POA: Insufficient documentation

## 2013-07-08 DIAGNOSIS — Z720 Tobacco use: Secondary | ICD-10-CM | POA: Diagnosis present

## 2013-07-08 DIAGNOSIS — F329 Major depressive disorder, single episode, unspecified: Secondary | ICD-10-CM | POA: Diagnosis present

## 2013-07-08 DIAGNOSIS — B9689 Other specified bacterial agents as the cause of diseases classified elsewhere: Secondary | ICD-10-CM

## 2013-07-08 DIAGNOSIS — N12 Tubulo-interstitial nephritis, not specified as acute or chronic: Secondary | ICD-10-CM

## 2013-07-08 DIAGNOSIS — A499 Bacterial infection, unspecified: Secondary | ICD-10-CM | POA: Insufficient documentation

## 2013-07-08 DIAGNOSIS — Z6841 Body Mass Index (BMI) 40.0 and over, adult: Secondary | ICD-10-CM | POA: Insufficient documentation

## 2013-07-08 DIAGNOSIS — F3289 Other specified depressive episodes: Secondary | ICD-10-CM

## 2013-07-08 DIAGNOSIS — R112 Nausea with vomiting, unspecified: Secondary | ICD-10-CM | POA: Diagnosis present

## 2013-07-08 DIAGNOSIS — F17200 Nicotine dependence, unspecified, uncomplicated: Secondary | ICD-10-CM

## 2013-07-08 HISTORY — DX: Migraine, unspecified, not intractable, without status migrainosus: G43.909

## 2013-07-08 HISTORY — DX: Headache: R51

## 2013-07-08 HISTORY — DX: Depression, unspecified: F32.A

## 2013-07-08 HISTORY — DX: Anxiety disorder, unspecified: F41.9

## 2013-07-08 HISTORY — DX: Urinary tract infection, site not specified: N39.0

## 2013-07-08 HISTORY — DX: Pure hypercholesterolemia, unspecified: E78.00

## 2013-07-08 HISTORY — DX: Pneumonia, unspecified organism: J18.9

## 2013-07-08 HISTORY — DX: Major depressive disorder, single episode, unspecified: F32.9

## 2013-07-08 HISTORY — DX: Shortness of breath: R06.02

## 2013-07-08 LAB — CBC WITH DIFFERENTIAL/PLATELET
Basophils Absolute: 0 10*3/uL (ref 0.0–0.1)
HCT: 33 % — ABNORMAL LOW (ref 36.0–46.0)
Hemoglobin: 10.7 g/dL — ABNORMAL LOW (ref 12.0–15.0)
Lymphocytes Relative: 27 % (ref 12–46)
Monocytes Absolute: 0.7 10*3/uL (ref 0.1–1.0)
Neutro Abs: 4.8 10*3/uL (ref 1.7–7.7)
RDW: 16.1 % — ABNORMAL HIGH (ref 11.5–15.5)
WBC: 7.8 10*3/uL (ref 4.0–10.5)

## 2013-07-08 LAB — URINE MICROSCOPIC-ADD ON

## 2013-07-08 LAB — COMPREHENSIVE METABOLIC PANEL
ALT: 22 U/L (ref 0–35)
AST: 23 U/L (ref 0–37)
CO2: 24 mEq/L (ref 19–32)
Chloride: 102 mEq/L (ref 96–112)
Creatinine, Ser: 0.78 mg/dL (ref 0.50–1.10)
GFR calc non Af Amer: >90 mL/min (ref >90)
Sodium: 136 mEq/L (ref 135–145)
Total Bilirubin: 0.4 mg/dL (ref 0.3–1.2)

## 2013-07-08 LAB — URINALYSIS, ROUTINE W REFLEX MICROSCOPIC
Glucose, UA: NEGATIVE mg/dL
Ketones, ur: 15 mg/dL — AB
Protein, ur: 100 mg/dL — AB

## 2013-07-08 LAB — WET PREP, GENITAL: Trich, Wet Prep: NONE SEEN

## 2013-07-08 MED ORDER — HYDROMORPHONE HCL PF 1 MG/ML IJ SOLN: 1.0000 mg | INTRAMUSCULAR | Status: AC | PRN

## 2013-07-08 MED ORDER — SODIUM CHLORIDE 0.9 % IV BOLUS (SEPSIS)
1000.0000 mL | Freq: Once | INTRAVENOUS | Status: AC
  Administered 2013-07-08: 1000 mL via INTRAVENOUS

## 2013-07-08 MED ORDER — OXYCODONE HCL 5 MG PO TABS
5.0000 mg | ORAL_TABLET | ORAL | Status: DC | PRN
  Administered 2013-07-08 – 2013-07-10 (×4): 5 mg via ORAL
  Filled 2013-07-08 (×4): qty 1

## 2013-07-08 MED ORDER — SERTRALINE HCL 50 MG PO TABS
50.0000 mg | ORAL_TABLET | Freq: Every day | ORAL | Status: DC
  Administered 2013-07-08 – 2013-07-10 (×3): 50 mg via ORAL
  Filled 2013-07-08 (×3): qty 1

## 2013-07-08 MED ORDER — ACETAMINOPHEN 325 MG PO TABS
650.0000 mg | ORAL_TABLET | Freq: Four times a day (QID) | ORAL | Status: DC | PRN
  Administered 2013-07-08: 650 mg via ORAL
  Filled 2013-07-08: qty 2

## 2013-07-08 MED ORDER — ONDANSETRON HCL 4 MG/2ML IJ SOLN
4.0000 mg | Freq: Once | INTRAMUSCULAR | Status: AC
  Administered 2013-07-08: 4 mg via INTRAVENOUS
  Filled 2013-07-08: qty 2

## 2013-07-08 MED ORDER — SODIUM CHLORIDE 0.9 % IV SOLN
INTRAVENOUS | Status: AC
  Administered 2013-07-08: 17:00:00 via INTRAVENOUS

## 2013-07-08 MED ORDER — MORPHINE SULFATE 2 MG/ML IJ SOLN
2.0000 mg | INTRAMUSCULAR | Status: DC | PRN
  Administered 2013-07-09 – 2013-07-10 (×5): 2 mg via INTRAVENOUS
  Filled 2013-07-08 (×6): qty 1

## 2013-07-08 MED ORDER — MORPHINE SULFATE 4 MG/ML IJ SOLN
4.0000 mg | Freq: Once | INTRAMUSCULAR | Status: AC
  Administered 2013-07-08: 4 mg via INTRAVENOUS
  Filled 2013-07-08: qty 1

## 2013-07-08 MED ORDER — NICOTINE 14 MG/24HR TD PT24
14.0000 mg | MEDICATED_PATCH | Freq: Every day | TRANSDERMAL | Status: DC
  Administered 2013-07-08 – 2013-07-10 (×3): 14 mg via TRANSDERMAL
  Filled 2013-07-08 (×3): qty 1

## 2013-07-08 MED ORDER — GI COCKTAIL ~~LOC~~
30.0000 mL | Freq: Once | ORAL | Status: AC
  Administered 2013-07-08: 30 mL via ORAL
  Filled 2013-07-08: qty 30

## 2013-07-08 MED ORDER — DEXTROSE 5 % IV SOLN
1.0000 g | Freq: Once | INTRAVENOUS | Status: AC
  Administered 2013-07-08: 1 g via INTRAVENOUS
  Filled 2013-07-08: qty 10

## 2013-07-08 MED ORDER — ONDANSETRON HCL 4 MG/2ML IJ SOLN: 4.0000 mg | Freq: Four times a day (QID) | INTRAMUSCULAR | Status: DC | PRN

## 2013-07-08 MED ORDER — ONDANSETRON HCL 4 MG/2ML IJ SOLN: 4.0000 mg | Freq: Three times a day (TID) | INTRAMUSCULAR | Status: DC | PRN

## 2013-07-08 MED ORDER — ONDANSETRON HCL 4 MG PO TABS: 4.0000 mg | ORAL_TABLET | Freq: Four times a day (QID) | ORAL | Status: DC | PRN

## 2013-07-08 MED ORDER — DEXTROSE 5 % IV SOLN
1.0000 g | INTRAVENOUS | Status: DC
  Administered 2013-07-08 – 2013-07-09 (×2): 1 g via INTRAVENOUS
  Filled 2013-07-08 (×3): qty 10

## 2013-07-08 MED ORDER — ACETAMINOPHEN 650 MG RE SUPP: 650.0000 mg | Freq: Four times a day (QID) | RECTAL | Status: DC | PRN

## 2013-07-08 NOTE — ED Notes (Signed)
Pt vomited small amount of clear fluid. Dr. Micheline Maze aware. At bedside with patient. Waiting for orders.

## 2013-07-08 NOTE — ED Notes (Signed)
Pt with emesis x 2.5 weeks.  Tuesday she began feeling epigastric pain that radiates up her chest.  Attempted to take antiacids but unable to tolerate po.

## 2013-07-08 NOTE — H&P (Signed)
[facility] History and Physical  Emma Green ZOX:[id number] DOB: 12-21-1985 DOA: 07/08/2013  Referring physician: Jackelyn Knife, ER physician PCP: No PCP Per Patient  Specialists: None  Chief Complaint:  Nausea and vomiting  HPI: Emma Green is a 27 y.o. female  Past medical history tobacco abuse and depression who for the last one week has been having complaints of abdominal pain described as periumbilical but also radiating down both sides of her abdomen and into her groin causing secondary nausea and vomiting the pain is so severe. Patient states that she also feeling radiating into her back and flanks, more so on the left-hand side. Patient has not noted any blood in her urine and had no previous history of kidney stones as she does have a history of pyelonephritis. She's a point now she is barely eating anything for the past week because of persistent nausea and vomiting from pain, so she came into the emergency room for further evaluation. Labs are unremarkable except for urinalysis which noted gross hematuria and a moderate UTI. Patient was given a dose of IV Rocephin and hospitalists were called for further evaluation and admission.  Review of Systems: Patient seen after arrival to the floor a number of hours since her dose of Rocephin. Her symptoms have persisted with continued pain more days on the left-hand side and she can feel in her back. Does not really associate this with any movement or deep inspiratory effort. Otherwise doing well, no headaches, vision changes, dysphasia, chest pain, palpitations, shortness of breath, wheeze, cough, diarrhea, hematuria or dysuria. Focal steady numbness weakness or pain.  Past Medical History  Diagnosis Date  . Pyelonephritis   . Chlamydia   . Cervical cyst   . High cholesterol   . Pneumonia 2011  . Exertional shortness of breath   . WJXBJYNW(295.6)     "weekly" (07/08/2013)  . Migraine     "monthly" (07/08/2013)   . Depression   . Anxiety    Past Surgical History  Procedure Laterality Date  . Cesarean section  2008   Social History:  reports that she has been smoking Cigarettes.  She has a 4.5 pack-year smoking history. She has never used smokeless tobacco. She reports that she drinks about 2.4 ounces of alcohol per week. She reports that she does not use illicit drugs. Patient was at home with her mom and her son. She is able to participate in all activities of daily living without assistance  No Known Allergies  Family history: Hypertension  Prior to Admission medications   Medication Sig Start Date End Date Taking? Authorizing Provider  Aspirin-Salicylamide-Caffeine (BC HEADACHE POWDER PO) Take 1 packet by mouth daily as needed (for headache).   Yes Historical Provider, MD  PRESCRIPTION MEDICATION Take 1 tablet by mouth daily at 2 am. Birth Control   Yes Historical Provider, MD  sertraline (ZOLOFT) 50 MG tablet Take 50 mg by mouth daily.   Yes Historical Provider, MD   Physical Exam: Filed Vitals:   07/08/13 1714  BP: 121/79  Pulse: 62  Temp: 98.6 F (37 C)  Resp: 18     General:  Alert and oriented x3, mild distress when she has an episode of pain  Eyes: Sclera nonicteric, extraocular movements are intact  ENT: Normocephalic, atraumatic, mucous membranes are slightly dry  Neck: Supple, no JVD  Cardiovascular: Regular rate and rhythm, S1-S2  Respiratory: Clear to auscultation bilaterally  Abdomen: Soft, obese, nontender, positive bowel sounds  Positive left-sided flank tenderness  Skin: No skin breaks, tears or lesions  Musculoskeletal: No clubbing or cyanosis or edema  Psychiatric: Patient is appropriate, no evidence of psychoses  Neurologic: No focal deficits  Labs on Admission:  Basic Metabolic Panel:  Recent Labs Lab 07/08/13 0836  NA 136  K 3.9  CL 102  CO2 24  GLUCOSE 82  BUN 12  CREATININE 0.78  CALCIUM 8.8   Liver Function Tests:  Recent  Labs Lab 07/08/13 0836  AST 23  ALT 22  ALKPHOS 55  BILITOT 0.4  PROT 8.0  ALBUMIN 3.6    Recent Labs Lab 07/08/13 0836  LIPASE 17   No results found for this basename: AMMONIA,  in the last 168 hours CBC:  Recent Labs Lab 07/08/13 0836  WBC 7.8  NEUTROABS 4.8  HGB 10.7*  HCT 33.0*  MCV 81.3  PLT 380   Cardiac Enzymes: No results found for this basename: CKTOTAL, CKMB, CKMBINDEX, TROPONINI,  in the last 168 hours  BNP (last 3 results) No results found for this basename: PROBNP,  in the last 8760 hours CBG: No results found for this basename: GLUCAP,  in the last 168 hours  Radiological Exams on Admission: No results found.  EKG: Independently reviewed. Normal sinus rhythm  Assessment/Plan Principal Problem:   UTI (urinary tract infection): It's possible this could be pyelonephritis, and given patient's flank tenderness and moderate UTI. However no fever and no white count given the pauses. Other likely possibility is renal stone-especially in light of gross hematuria and description of symptoms. She's not noted to have any crystals on her urine. Would favor checking spiral CT without contrast to rule out stone. IV Rocephin regardless for antibiotics given UTI Active Problems:   Nausea with vomiting: Secondary to either urinary infection or renal stone   Depressive disorder, not elsewhere classified: Continue SSRI   Morbid obesity   Tobacco abuse: Nicotine patch. Counseled the patient.    Code Status: Full code  Family Communication: Plan discussed with patient and her mom at the bedside  Disposition Plan: If symptoms are better, home tomorrow  Time spent: 25 minutes  Hollice Espy [facility] Pager [phone]  If 7PM-7AM, please contact night-coverage [email or web address] Password [facility] 07/08/2013, 7:19 PM

## 2013-07-08 NOTE — ED Notes (Signed)
Pt given crackers and gingerale for PO challenge.

## 2013-07-08 NOTE — Progress Notes (Signed)
NURSING PROGRESS NOTE  [name] [id number] Admission Data: 07/08/2013 5:23 PM Attending Provider: Hollice Espy, MD PCP:No PCP Per Patient Code Status: full  Emma Green is a 27 y.o. female patient admitted from ED:  -No acute distress noted.  -No complaints of shortness of breath.  -No complaints of chest pain.     Blood pressure 121/79, pulse 62, temperature 98.6 F (37 C), temperature source Oral, resp. rate 18, height 5\' 2"  (1.575 m), weight 99.927 kg (220 lb 4.8 oz), last menstrual period 06/25/2013, SpO2 99.00%.   IV Fluids:  IV in place, occlusive dsg intact without redness, IV cath hand right, condition patent and no redness normal salin @ 100.  Allergies:  Review of patient's allergies indicates no known allergies.  Past Medical History:   has a past medical history of Pyelonephritis; Chlamydia; and Cervical cyst.  Past Surgical History:   has past surgical history that includes Cesarean section.  Social History:   reports that she has been smoking Cigarettes.  She has a 4.5 pack-year smoking history. She does not have any smokeless tobacco history on file. She reports that she drinks alcohol. She reports that she does not use illicit drugs.  Skin: warm dry intact   Patient/Family orientated to room. Information packet given to patient/family. Admission inpatient armband information verified with patient/family to include name and date of birth and placed on patient arm. Side rails up x 2, fall assessment and education completed with patient/family. Patient/family able to verbalize understanding of risk associated with falls and verbalized understanding to call for assistance before getting out of bed. Call light within reach. Patient/family able to voice and demonstrate understanding of unit orientation instructions.    Will continue to evaluate and treat per MD orders.

## 2013-07-08 NOTE — ED Provider Notes (Signed)
CSN: [id number]     Arrival date & time 07/08/13  0805 History   First MD Initiated Contact with Patient 07/08/13 [phone]     Chief Complaint  Patient presents with  . Emesis  . epigastric/chest pain    (Consider location/radiation/quality/duration/timing/severity/associated sxs/prior Treatment) Patient is a 27 y.o. female presenting with vomiting. The history is provided by the patient. No language interpreter was used.  Emesis Severity:  Moderate Duration:  2 weeks Timing:  Constant Quality:  Stomach contents Progression:  Unchanged Chronicity:  New Recent urination:  Normal Relieved by:  Nothing Worsened by:  Ice chips and liquids Ineffective treatments:  None tried Associated symptoms: abdominal pain   Associated symptoms: no arthralgias, no chills, no cough, no diarrhea, no headaches, no myalgias and no sore throat   Abdominal pain:    Location:  Epigastric, LLQ and RLQ (low back)   Quality:  Aching   Severity:  Moderate   Onset quality:  Gradual   Duration:  1 week   Progression:  Waxing and waning   Chronicity:  New Risk factors: prior abdominal surgery   Risk factors: no alcohol use, no diabetes, not pregnant now, no sick contacts, no suspect food intake and no travel to endemic areas     Past Medical History  Diagnosis Date  . Pyelonephritis   . Chlamydia   . Cervical cyst   . High cholesterol   . Pneumonia 2011  . Exertional shortness of breath   . FAOZHYQM(578.4)     "weekly" (07/08/2013)  . Migraine     "monthly" (07/08/2013)  . Depression   . Anxiety    Past Surgical History  Procedure Laterality Date  . Cesarean section  2008   History reviewed. No pertinent family history. History  Substance Use Topics  . Smoking status: Current Every Day Smoker -- 0.50 packs/day for 9 years    Types: Cigarettes  . Smokeless tobacco: Never Used  . Alcohol Use: 2.4 oz/week    4 Cans of beer per week   OB History   Grav Para Term Preterm Abortions TAB SAB Ect  Mult Living   1 1 1       1      Review of Systems  Constitutional: Negative for fever, chills, diaphoresis, activity change, appetite change and fatigue.  HENT: Negative for congestion, facial swelling, rhinorrhea and sore throat.   Eyes: Negative for photophobia and discharge.  Respiratory: Negative for cough, chest tightness and shortness of breath.   Cardiovascular: Negative for chest pain, palpitations and leg swelling.  Gastrointestinal: Positive for vomiting and abdominal pain. Negative for nausea and diarrhea.  Endocrine: Negative for polydipsia and polyuria.  Genitourinary: Negative for dysuria, frequency, difficulty urinating and pelvic pain.  Musculoskeletal: Negative for arthralgias, back pain, myalgias, neck pain and neck stiffness.  Skin: Negative for color change and wound.  Allergic/Immunologic: Negative for immunocompromised state.  Neurological: Negative for facial asymmetry, weakness, numbness and headaches.  Hematological: Does not bruise/bleed easily.  Psychiatric/Behavioral: Negative for confusion and agitation.    Allergies  Review of patient's allergies indicates no known allergies.  Home Medications   No current outpatient prescriptions on file. BP 119/80  Pulse 73  Temp(Src) 98.2 F (36.8 C) (Oral)  Resp 18  Ht 5\' 2"  (1.575 m)  Wt 220 lb 4.8 oz (99.927 kg)  BMI 40.28 kg/m2  SpO2 97%  LMP 06/25/2013 Physical Exam  Constitutional: She is oriented to person, place, and time. She appears well-developed and well-nourished. No distress.  HENT:  Head: Normocephalic and atraumatic.  Mouth/Throat: No oropharyngeal exudate.  Eyes: Pupils are equal, round, and reactive to light.  Neck: Normal range of motion. Neck supple.  Cardiovascular: Normal rate, regular rhythm and normal heart sounds.  Exam reveals no gallop and no friction rub.   No murmur heard. Pulmonary/Chest: Effort normal and breath sounds normal. No respiratory distress. She has no wheezes. She  has no rales.  Abdominal: Soft. Bowel sounds are normal. She exhibits no distension and no mass. There is tenderness in the epigastric area. There is no rebound and no guarding.  R CVA tenderness  Musculoskeletal: Normal range of motion. She exhibits no edema and no tenderness.  Neurological: She is alert and oriented to person, place, and time.  Skin: Skin is warm and dry.  Psychiatric: She has a normal mood and affect.    ED Course  Procedures (including critical care time) Labs Review Labs Reviewed  WET PREP, GENITAL - Abnormal; Notable for the following:    Clue Cells Wet Prep HPF POC FEW (*)    WBC, Wet Prep HPF POC FEW (*)    All other components within normal limits  CBC WITH DIFFERENTIAL - Abnormal; Notable for the following:    Hemoglobin 10.7 (*)    HCT 33.0 (*)    RDW 16.1 (*)    All other components within normal limits  URINALYSIS, ROUTINE W REFLEX MICROSCOPIC - Abnormal; Notable for the following:    Color, Urine RED (*)    APPearance TURBID (*)    Specific Gravity, Urine 1.034 (*)    Hgb urine dipstick LARGE (*)    Bilirubin Urine MODERATE (*)    Ketones, ur 15 (*)    Protein, ur 100 (*)    Leukocytes, UA MODERATE (*)    All other components within normal limits  URINE MICROSCOPIC-ADD ON - Abnormal; Notable for the following:    Squamous Epithelial / LPF MANY (*)    Bacteria, UA MANY (*)    All other components within normal limits  URINALYSIS, ROUTINE W REFLEX MICROSCOPIC - Abnormal; Notable for the following:    APPearance HAZY (*)    Hgb urine dipstick LARGE (*)    All other components within normal limits  URINE MICROSCOPIC-ADD ON - Abnormal; Notable for the following:    Squamous Epithelial / LPF MANY (*)    All other components within normal limits  URINE CULTURE  GC/CHLAMYDIA PROBE AMP  COMPREHENSIVE METABOLIC PANEL  LIPASE, BLOOD  POCT PREGNANCY, URINE  POCT I-STAT TROPONIN I   Imaging Review Ct Abdomen Pelvis Wo Contrast  07/08/2013    CLINICAL DATA:  Right flank pain. Nausea and vomiting. Urinary tract infection and hematuria.  EXAM: CT ABDOMEN AND PELVIS WITHOUT CONTRAST  TECHNIQUE: Multidetector CT imaging of the abdomen and pelvis was performed following the standard protocol without intravenous contrast.  COMPARISON:  11/14/2003.  FINDINGS: Normal noncontrasted appearance of the liver, spleen, pancreas, adrenal glands, kidneys, urinary bladder and ovaries. Intrauterine device within the uterus. Multiple bilateral pelvic phleboliths. Probable mild amount of sludge in the gallbladder. No gastrointestinal abnormalities or enlarged lymph nodes. Normal appearing appendix. Normal appearing bones. Clear lung bases.  IMPRESSION: No significant abnormality.   Electronically Signed   By: Gordan Payment M.D.   On: 07/08/2013 20:48    EKG Interpretation     Ventricular Rate:  85 PR Interval:  146 QRS Duration: 82 QT Interval:  376 QTC Calculation: 447 R Axis:   45 Text Interpretation:  Normal sinus rhythm Normal ECG Unchanged from prior.            MDM   1. Pyelonephritis   2. Depressive disorder, not elsewhere classified   3. Morbid obesity   4. Tobacco abuse   5. Hematuria   6. UTI (urinary tract infection)    Pt is a 27 y.o. female with Pmhx as above who presents with 2.5 weeks of frequent n/v after PO, now w/ lower ab pain, low back pain, and epigastric pain radiating to chest for 1 week.  Denies urinary symptoms, had had irregular vaginal bleeding since IUD placed in May, current episode or bleeding started 13 days ago, lighter than menses.  On PE, T 99, VS otherwise stable, pt in NAD.  +ttp lower quadrants, epigastrium & R CVA tenderness.  No rebound or guarding.  Pelvic unremarkable except for dark menstrual blood from os.  W/u concerning for UTI.  Labs otherwise grossly unremarkable.  Pt given 2L NS, multiple doses of antiemetics, and & GI cocktail.  Pt still unable to tolerate PO.  IV rocephin given.  Medical service  will admit for pyelonephritis.          Shanna Cisco, MD 07/09/13 [phone]

## 2013-07-09 DIAGNOSIS — N390 Urinary tract infection, site not specified: Principal | ICD-10-CM

## 2013-07-09 DIAGNOSIS — R319 Hematuria, unspecified: Secondary | ICD-10-CM

## 2013-07-09 LAB — URINALYSIS, ROUTINE W REFLEX MICROSCOPIC
Bilirubin Urine: NEGATIVE
Glucose, UA: NEGATIVE mg/dL
Ketones, ur: NEGATIVE mg/dL
Protein, ur: NEGATIVE mg/dL
Specific Gravity, Urine: 1.015 (ref 1.005–1.030)
Urobilinogen, UA: 0.2 mg/dL (ref 0.0–1.0)
pH: 6 (ref 5.0–8.0)

## 2013-07-09 LAB — URINE MICROSCOPIC-ADD ON

## 2013-07-09 LAB — GC/CHLAMYDIA PROBE AMP: GC Probe RNA: NEGATIVE

## 2013-07-09 MED ORDER — METRONIDAZOLE 500 MG PO TABS
500.0000 mg | ORAL_TABLET | Freq: Two times a day (BID) | ORAL | Status: DC
  Administered 2013-07-09 – 2013-07-10 (×3): 500 mg via ORAL
  Filled 2013-07-09 (×6): qty 1

## 2013-07-09 MED ORDER — SODIUM CHLORIDE 0.9 % IJ SOLN
3.0000 mL | Freq: Two times a day (BID) | INTRAMUSCULAR | Status: DC
  Administered 2013-07-09: 3 mL via INTRAVENOUS

## 2013-07-09 NOTE — Progress Notes (Signed)
[facility] PROGRESS NOTE  Emma Green ZOX:[id number] DOB: [birth year] DOA: 07/08/2013 PCP: No PCP Per Patient  HPI/Subjective: Patient was complaining about right paraspinal pain in the morning. Complaining about pain in both flanks in the afternoon. Patient was about to be discharged, complaining about intolerable pain in her back.  Assessment/Plan: Principal Problem:   UTI (urinary tract infection) Active Problems:   Depressive disorder, not elsewhere classified   Morbid obesity   Tobacco abuse   Hematuria   UTI -Urinalysis consistent with UTI, with pus cells and many bacteria. -Started empirically on Rocephin, adjust antibiotics according to the culture results.  Bacterial vaginosis -Wet prep showed clue cells consistent with BV. -Patient started on metronidazole 500 mg twice a day for 7 days  Hematuria -Questionable hematuria, this is painless.  -CT scan of abdomen pelvis done and showed no acute abnormalities. -Patient said she has spotting secondary to IUD,? Bloody spotting contaminated the urine.  Morbid obesity -Patient counseled extensively about losing weight.  Flank pain -No evidence of abnormality on CT scan. -Could be muscular.  Patient mentioned that she have a court date today, CSW sent an excuse letter to the court.  Code Status: Full code Family Communication: Plan discussed with the patient. Disposition Plan: Remains inpatient   Consultants:  None  Procedures:  None  Antibiotics:  Rocephin  Metronidazole   Objective: Filed Vitals:   07/09/13 1427  BP: 119/80  Pulse: 73  Temp: 98.2 F (36.8 C)  Resp: 18   No intake or output data in the 24 hours ending 07/09/13 1438 Filed Weights   07/08/13 0810 07/08/13 1714  Weight: 100.562 kg (221 lb 11.2 oz) 99.927 kg (220 lb 4.8 oz)    Exam: General: Alert and awake, oriented x3, not in any acute distress. HEENT: anicteric sclera, pupils reactive to light and  accommodation, EOMI CVS: S1-S2 clear, no murmur rubs or gallops Chest: clear to auscultation bilaterally, no wheezing, rales or rhonchi Abdomen: soft nontender, nondistended, normal bowel sounds, no organomegaly Extremities: no cyanosis, clubbing or edema noted bilaterally Neuro: Cranial nerves II-XII intact, no focal neurological deficits  Data Reviewed: Basic Metabolic Panel:  Recent Labs Lab 07/08/13 0836  NA 136  K 3.9  CL 102  CO2 24  GLUCOSE 82  BUN 12  CREATININE 0.78  CALCIUM 8.8   Liver Function Tests:  Recent Labs Lab 07/08/13 0836  AST 23  ALT 22  ALKPHOS 55  BILITOT 0.4  PROT 8.0  ALBUMIN 3.6    Recent Labs Lab 07/08/13 0836  LIPASE 17   No results found for this basename: AMMONIA,  in the last 168 hours CBC:  Recent Labs Lab 07/08/13 0836  WBC 7.8  NEUTROABS 4.8  HGB 10.7*  HCT 33.0*  MCV 81.3  PLT 380   Cardiac Enzymes: No results found for this basename: CKTOTAL, CKMB, CKMBINDEX, TROPONINI,  in the last 168 hours BNP (last 3 results) No results found for this basename: PROBNP,  in the last 8760 hours CBG: No results found for this basename: GLUCAP,  in the last 168 hours  Micro Recent Results (from the past 240 hour(s))  URINE CULTURE     Status: None   Collection Time    07/08/13  8:29 AM      Result Value Range Status   Specimen Description URINE, CLEAN CATCH   Final   Special Requests NONE   Final   Culture  Setup Time     Final   Value: 07/08/2013  09:56     Performed at [facility]     Final   Value: NO GROWTH     Performed at [facility]   Report Status 07/09/2013 FINAL   Final  GC/CHLAMYDIA PROBE AMP     Status: None   Collection Time    07/08/13 11:29 AM      Result Value Range Status   CT Probe RNA NEGATIVE  NEGATIVE Final   GC Probe RNA NEGATIVE  NEGATIVE Final   Comment: (NOTE)                                                                                              **Normal  Reference Range: Negative**          Assay performed using the Gen-Probe APTIMA COMBO2 (R) Assay.     Acceptable specimen types for this assay include APTIMA Swabs (Unisex,     endocervical, urethral, or vaginal), first void urine, and ThinPrep     liquid based cytology samples.     Performed at [facility]  WET PREP, GENITAL     Status: Abnormal   Collection Time    07/08/13 11:29 AM      Result Value Range Status   Yeast Wet Prep HPF POC NONE SEEN  NONE SEEN Final   Trich, Wet Prep NONE SEEN  NONE SEEN Final   Clue Cells Wet Prep HPF POC FEW (*) NONE SEEN Final   WBC, Wet Prep HPF POC FEW (*) NONE SEEN Final     Studies: Ct Abdomen Pelvis Wo Contrast  07/08/2013   CLINICAL DATA:  Right flank pain. Nausea and vomiting. Urinary tract infection and hematuria.  EXAM: CT ABDOMEN AND PELVIS WITHOUT CONTRAST  TECHNIQUE: Multidetector CT imaging of the abdomen and pelvis was performed following the standard protocol without intravenous contrast.  COMPARISON:  11/14/2003.  FINDINGS: Normal noncontrasted appearance of the liver, spleen, pancreas, adrenal glands, kidneys, urinary bladder and ovaries. Intrauterine device within the uterus. Multiple bilateral pelvic phleboliths. Probable mild amount of sludge in the gallbladder. No gastrointestinal abnormalities or enlarged lymph nodes. Normal appearing appendix. Normal appearing bones. Clear lung bases.  IMPRESSION: No significant abnormality.   Electronically Signed   By: Gordan Payment M.D.   On: 07/08/2013 20:48    Scheduled Meds: . cefTRIAXone (ROCEPHIN)  IV  1 g Intravenous Q24H  . nicotine  14 mg Transdermal Daily  . sertraline  50 mg Oral Daily   Continuous Infusions:      Time spent: 35 minutes    [facility] A  [facility] Pager [phone] If 7PM-7AM, please contact night-coverage at [email or web address], password [facility] 07/09/2013, 2:38 PM  LOS: 1 day

## 2013-07-09 NOTE — Clinical Social Work Note (Signed)
Per patient's request CSW faxed letter to [facility] stating that patient was admitted to the hospital on 07/08/13, and still remains in the hospital to this point, as she has missed her court date for 07/09/13. Copy of letter given to patient. CSW signing off.  Roddie Mc, [place], [place], [phone]

## 2013-07-09 NOTE — Care Management Note (Signed)
    Page 1 of 1   07/09/2013     1:58:10 PM   CARE MANAGEMENT NOTE 07/09/2013  Patient:  Emma Green, Emma Green   Account Number:  [id number]  Date Initiated:  07/09/2013  Documentation initiated by:  Letha Cape  Subjective/Objective Assessment:   dx uti, abd pain, n/v  admit as observatin- pta indep.     Action/Plan:   Anticipated DC Date:  07/09/2013   Anticipated DC Plan:  HOME/SELF CARE      DC Planning Services  CM consult      Choice offered to / List presented to:             Status of service:  Completed, signed off Medicare Important Message given?   (If response is "NO", the following Medicare IM given date fields will be blank) Date Medicare IM given:   Date Additional Medicare IM given:    Discharge Disposition:  HOME/SELF CARE  Per UR Regulation:  Reviewed for med. necessity/level of care/duration of stay  If discussed at Long Length of Stay Meetings, dates discussed:    Comments:  07/09/13 13:57 Letha Cape RN, BSN [phone] patient lives with family, pta indep.  No needs anticipated.

## 2013-07-10 DIAGNOSIS — N760 Acute vaginitis: Secondary | ICD-10-CM

## 2013-07-10 DIAGNOSIS — A499 Bacterial infection, unspecified: Secondary | ICD-10-CM

## 2013-07-10 DIAGNOSIS — B9689 Other specified bacterial agents as the cause of diseases classified elsewhere: Secondary | ICD-10-CM

## 2013-07-10 MED ORDER — CIPROFLOXACIN HCL 500 MG PO TABS: 500.0000 mg | ORAL_TABLET | Freq: Two times a day (BID) | ORAL | Status: DC

## 2013-07-10 MED ORDER — METRONIDAZOLE 500 MG PO TABS: 500.0000 mg | ORAL_TABLET | Freq: Two times a day (BID) | ORAL | Status: DC

## 2013-07-10 NOTE — Progress Notes (Signed)
NURSING PROGRESS NOTE  Emma Green [id number] Discharge Data: 07/10/2013 10:52 AM Attending Provider: Clydia Llano, MD PCP:No PCP Per Patient     [name] to be D/C'd Home per MD order.  Discussed with the patient the After Visit Summary and all questions fully answered. All IV's discontinued with no bleeding noted. All belongings returned to patient for patient to take home.   Last Vital Signs:  Blood pressure 100/64, pulse 74, temperature 98.3 F (36.8 C), temperature source Oral, resp. rate 20, height 5\' 2"  (1.575 m), weight 99.927 kg (220 lb 4.8 oz), last menstrual period 06/25/2013, SpO2 100.00%.  Discharge Medication List   Medication List         BC HEADACHE POWDER PO  Take 1 packet by mouth daily as needed (for headache).     ciprofloxacin 500 MG tablet  Commonly known as:  CIPRO  Take 1 tablet (500 mg total) by mouth 2 (two) times daily.     metroNIDAZOLE 500 MG tablet  Commonly known as:  FLAGYL  Take 1 tablet (500 mg total) by mouth every 12 (twelve) hours.     PRESCRIPTION MEDICATION  Take 1 tablet by mouth daily at 2 am. Birth Control     sertraline 50 MG tablet  Commonly known as:  ZOLOFT  Take 50 mg by mouth daily.

## 2013-07-10 NOTE — Discharge Summary (Signed)
Physician Discharge Summary  Emma Green ZOX:[id number] DOB: Dec 27, 1985 DOA: 07/08/2013  PCP: No PCP Per Patient  Admit date: 07/08/2013 Discharge date: 07/10/2013  Time spent: 40 minutes  Recommendations for Outpatient Follow-up:  1. Followup with Department of health, [facility]. Within one week.  Discharge Diagnoses:  Principal Problem:   UTI (urinary tract infection) Active Problems:   Depressive disorder, not elsewhere classified   Morbid obesity   Tobacco abuse   Hematuria   BV (bacterial vaginosis)   Discharge Condition: Stable  Diet recommendation: Regular  Filed Weights   07/08/13 0810 07/08/13 1714  Weight: 100.562 kg (221 lb 11.2 oz) 99.927 kg (220 lb 4.8 oz)    History of present illness:  Emma Green is a 27 y.o. female  Past medical history tobacco abuse and depression who for the last one week has been having complaints of abdominal pain described as periumbilical but also radiating down both sides of her abdomen and into her groin causing secondary nausea and vomiting the pain is so severe. Patient states that she also feeling radiating into her back and flanks, more so on the left-hand side. Patient has not noted any blood in her urine and had no previous history of kidney stones as she does have a history of pyelonephritis. She's a point now she is barely eating anything for the past week because of persistent nausea and vomiting from pain, so she came into the emergency room for further evaluation. Labs are unremarkable except for urinalysis which noted gross hematuria and a moderate UTI. Patient was given a dose of IV Rocephin and hospitalists were called for further evaluation and admission.  Hospital Course:   1. UTI: At the time of admission urine also was consistent with UTI with pus cells and many bacteria. Patient started empirically on Rocephin, but the culture did not show growth, I elected to treat the patient for total of 5 days  with antibiotics. Patient reports a lot of pain in her right flank, CT scan was done and showed no evidence of acute abnormalities.  2. Bacterial vaginosis: Wet mount showed clue cells consistent with BV, start on metronidazole 500 mg twice a day for 7 days.  3. Hematuria: This is questionable, and less hematuria. CT scan of abdomen and pelvis was done and showed no acute abnormalities. Patient said she has vaginal bleeding, she c date change her pads about 3 times yesterday, likely the hematuria is secondary to vaginal bleeding.  4. Flank pain: As mentioned above patient mentioned right sided flank pain, then she also had left-sided flank pain. This could be muscular, CT scan of abdomen pelvis showed no evidence of abnormalities. Anyway this pain resolved, patient instructed to take OTC Tylenol if pain returns.  Please note that patient mentioned that she has a court date on 07/09/2013, CSW seventh excuse letter to the [facility] court system.  Procedures:  None  Consultations:  None  Discharge Exam: Filed Vitals:   07/10/13 0525  BP: 100/64  Pulse: 74  Temp: 98.3 F (36.8 C)  Resp: 20   General: Alert and awake, oriented x3, not in any acute distress. HEENT: anicteric sclera, pupils reactive to light and accommodation, EOMI CVS: S1-S2 clear, no murmur rubs or gallops Chest: clear to auscultation bilaterally, no wheezing, rales or rhonchi Abdomen: soft nontender, nondistended, normal bowel sounds, no organomegaly Extremities: no cyanosis, clubbing or edema noted bilaterally Neuro: Cranial nerves II-XII intact, no focal neurological deficits  Discharge Instructions  Discharge Orders  Future Orders Complete By Expires   Increase activity slowly  As directed        Medication List         BC HEADACHE POWDER PO  Take 1 packet by mouth daily as needed (for headache).     ciprofloxacin 500 MG tablet  Commonly known as:  CIPRO  Take 1 tablet (500 mg total) by mouth  2 (two) times daily.     metroNIDAZOLE 500 MG tablet  Commonly known as:  FLAGYL  Take 1 tablet (500 mg total) by mouth every 12 (twelve) hours.     PRESCRIPTION MEDICATION  Take 1 tablet by mouth daily at 2 am. Birth Control     sertraline 50 MG tablet  Commonly known as:  ZOLOFT  Take 50 mg by mouth daily.       No Known Allergies     Follow-up Information   Follow up with Department of Health, [facility]. In 1 week.       The results of significant diagnostics from this hospitalization (including imaging, microbiology, ancillary and laboratory) are listed below for reference.    Significant Diagnostic Studies: Ct Abdomen Pelvis Wo Contrast  07/08/2013   CLINICAL DATA:  Right flank pain. Nausea and vomiting. Urinary tract infection and hematuria.  EXAM: CT ABDOMEN AND PELVIS WITHOUT CONTRAST  TECHNIQUE: Multidetector CT imaging of the abdomen and pelvis was performed following the standard protocol without intravenous contrast.  COMPARISON:  11/14/2003.  FINDINGS: Normal noncontrasted appearance of the liver, spleen, pancreas, adrenal glands, kidneys, urinary bladder and ovaries. Intrauterine device within the uterus. Multiple bilateral pelvic phleboliths. Probable mild amount of sludge in the gallbladder. No gastrointestinal abnormalities or enlarged lymph nodes. Normal appearing appendix. Normal appearing bones. Clear lung bases.  IMPRESSION: No significant abnormality.   Electronically Signed   By: Gordan Payment M.D.   On: 07/08/2013 20:48    Microbiology: Recent Results (from the past 240 hour(s))  URINE CULTURE     Status: None   Collection Time    07/08/13  8:29 AM      Result Value Range Status   Specimen Description URINE, CLEAN CATCH   Final   Special Requests NONE   Final   Culture  Setup Time     Final   Value: 07/08/2013 09:56     Performed at [facility]   Culture     Final   Value: NO GROWTH     Performed at [facility]   Report  Status 07/09/2013 FINAL   Final  GC/CHLAMYDIA PROBE AMP     Status: None   Collection Time    07/08/13 11:29 AM      Result Value Range Status   CT Probe RNA NEGATIVE  NEGATIVE Final   GC Probe RNA NEGATIVE  NEGATIVE Final   Comment: (NOTE)                                                                                              **Normal Reference Range: Negative**          Assay performed using the Gen-Probe  APTIMA COMBO2 (R) Assay.     Acceptable specimen types for this assay include APTIMA Swabs (Unisex,     endocervical, urethral, or vaginal), first void urine, and ThinPrep     liquid based cytology samples.     Performed at [facility]  WET PREP, GENITAL     Status: Abnormal   Collection Time    07/08/13 11:29 AM      Result Value Range Status   Yeast Wet Prep HPF POC NONE SEEN  NONE SEEN Final   Trich, Wet Prep NONE SEEN  NONE SEEN Final   Clue Cells Wet Prep HPF POC FEW (*) NONE SEEN Final   WBC, Wet Prep HPF POC FEW (*) NONE SEEN Final     Labs: Basic Metabolic Panel:  Recent Labs Lab 07/08/13 0836  NA 136  K 3.9  CL 102  CO2 24  GLUCOSE 82  BUN 12  CREATININE 0.78  CALCIUM 8.8   Liver Function Tests:  Recent Labs Lab 07/08/13 0836  AST 23  ALT 22  ALKPHOS 55  BILITOT 0.4  PROT 8.0  ALBUMIN 3.6    Recent Labs Lab 07/08/13 0836  LIPASE 17   No results found for this basename: AMMONIA,  in the last 168 hours CBC:  Recent Labs Lab 07/08/13 0836  WBC 7.8  NEUTROABS 4.8  HGB 10.7*  HCT 33.0*  MCV 81.3  PLT 380   Cardiac Enzymes: No results found for this basename: CKTOTAL, CKMB, CKMBINDEX, TROPONINI,  in the last 168 hours BNP: BNP (last 3 results) No results found for this basename: PROBNP,  in the last 8760 hours CBG: No results found for this basename: GLUCAP,  in the last 168 hours     Signed:  Alfredia Desanctis A  [facility] 07/10/2013, 10:41 AM

## 2013-08-26 ENCOUNTER — Inpatient Hospital Stay (HOSPITAL_COMMUNITY): Payer: Medicaid Other | Admitting: Anesthesiology

## 2013-08-26 ENCOUNTER — Inpatient Hospital Stay (HOSPITAL_COMMUNITY): Payer: Medicaid Other

## 2013-08-26 ENCOUNTER — Emergency Department (HOSPITAL_COMMUNITY): Payer: Medicaid Other

## 2013-08-26 ENCOUNTER — Encounter (HOSPITAL_COMMUNITY): Payer: Medicaid Other | Admitting: Anesthesiology

## 2013-08-26 ENCOUNTER — Inpatient Hospital Stay (HOSPITAL_COMMUNITY)
Admission: EM | Admit: 2013-08-26 | Discharge: 2013-08-31 | DRG: 493 | Disposition: A | Discharge status: Rehabilitation Facility | Payer: Medicaid Other | Attending: General Surgery | Admitting: General Surgery

## 2013-08-26 ENCOUNTER — Encounter (HOSPITAL_COMMUNITY): Payer: Self-pay | Admitting: Emergency Medicine

## 2013-08-26 ENCOUNTER — Encounter (HOSPITAL_COMMUNITY): Admission: EM | Disposition: A | Discharge status: Rehabilitation Facility | Payer: Self-pay | Source: Home / Self Care

## 2013-08-26 DIAGNOSIS — S12400A Unspecified displaced fracture of fifth cervical vertebra, initial encounter for closed fracture: Secondary | ICD-10-CM | POA: Diagnosis present

## 2013-08-26 DIAGNOSIS — S82209A Unspecified fracture of shaft of unspecified tibia, initial encounter for closed fracture: Principal | ICD-10-CM | POA: Diagnosis present

## 2013-08-26 DIAGNOSIS — Z6841 Body Mass Index (BMI) 40.0 and over, adult: Secondary | ICD-10-CM

## 2013-08-26 DIAGNOSIS — K5900 Constipation, unspecified: Secondary | ICD-10-CM | POA: Diagnosis present

## 2013-08-26 DIAGNOSIS — N12 Tubulo-interstitial nephritis, not specified as acute or chronic: Secondary | ICD-10-CM | POA: Diagnosis present

## 2013-08-26 DIAGNOSIS — S1093XA Contusion of unspecified part of neck, initial encounter: Secondary | ICD-10-CM

## 2013-08-26 DIAGNOSIS — S069X9A Unspecified intracranial injury with loss of consciousness of unspecified duration, initial encounter: Secondary | ICD-10-CM

## 2013-08-26 DIAGNOSIS — E7800 Pure hypercholesterolemia, unspecified: Secondary | ICD-10-CM | POA: Diagnosis present

## 2013-08-26 DIAGNOSIS — S82142A Displaced bicondylar fracture of left tibia, initial encounter for closed fracture: Secondary | ICD-10-CM

## 2013-08-26 DIAGNOSIS — Z79899 Other long term (current) drug therapy: Secondary | ICD-10-CM

## 2013-08-26 DIAGNOSIS — S129XXA Fracture of neck, unspecified, initial encounter: Secondary | ICD-10-CM

## 2013-08-26 DIAGNOSIS — S025XXA Fracture of tooth (traumatic), initial encounter for closed fracture: Secondary | ICD-10-CM | POA: Diagnosis present

## 2013-08-26 DIAGNOSIS — IMO0002 Reserved for concepts with insufficient information to code with codable children: Secondary | ICD-10-CM | POA: Diagnosis present

## 2013-08-26 DIAGNOSIS — F329 Major depressive disorder, single episode, unspecified: Secondary | ICD-10-CM | POA: Diagnosis present

## 2013-08-26 DIAGNOSIS — S0990XA Unspecified injury of head, initial encounter: Secondary | ICD-10-CM

## 2013-08-26 DIAGNOSIS — S069XAA Unspecified intracranial injury with loss of consciousness status unknown, initial encounter: Secondary | ICD-10-CM

## 2013-08-26 DIAGNOSIS — G43909 Migraine, unspecified, not intractable, without status migrainosus: Secondary | ICD-10-CM | POA: Diagnosis present

## 2013-08-26 DIAGNOSIS — S27329A Contusion of lung, unspecified, initial encounter: Secondary | ICD-10-CM | POA: Diagnosis present

## 2013-08-26 DIAGNOSIS — S82409A Unspecified fracture of shaft of unspecified fibula, initial encounter for closed fracture: Principal | ICD-10-CM

## 2013-08-26 DIAGNOSIS — F1210 Cannabis abuse, uncomplicated: Secondary | ICD-10-CM | POA: Diagnosis present

## 2013-08-26 DIAGNOSIS — A749 Chlamydial infection, unspecified: Secondary | ICD-10-CM | POA: Diagnosis present

## 2013-08-26 DIAGNOSIS — F17200 Nicotine dependence, unspecified, uncomplicated: Secondary | ICD-10-CM | POA: Diagnosis present

## 2013-08-26 DIAGNOSIS — S0003XA Contusion of scalp, initial encounter: Secondary | ICD-10-CM | POA: Diagnosis present

## 2013-08-26 DIAGNOSIS — S0083XA Contusion of other part of head, initial encounter: Secondary | ICD-10-CM

## 2013-08-26 DIAGNOSIS — F3289 Other specified depressive episodes: Secondary | ICD-10-CM | POA: Diagnosis present

## 2013-08-26 DIAGNOSIS — Z7982 Long term (current) use of aspirin: Secondary | ICD-10-CM

## 2013-08-26 DIAGNOSIS — S82109A Unspecified fracture of upper end of unspecified tibia, initial encounter for closed fracture: Secondary | ICD-10-CM | POA: Diagnosis present

## 2013-08-26 DIAGNOSIS — F411 Generalized anxiety disorder: Secondary | ICD-10-CM | POA: Diagnosis present

## 2013-08-26 DIAGNOSIS — S82202A Unspecified fracture of shaft of left tibia, initial encounter for closed fracture: Secondary | ICD-10-CM

## 2013-08-26 DIAGNOSIS — I959 Hypotension, unspecified: Secondary | ICD-10-CM | POA: Diagnosis not present

## 2013-08-26 DIAGNOSIS — E669 Obesity, unspecified: Secondary | ICD-10-CM | POA: Diagnosis present

## 2013-08-26 DIAGNOSIS — D62 Acute posthemorrhagic anemia: Secondary | ICD-10-CM | POA: Diagnosis present

## 2013-08-26 HISTORY — PX: TIBIA IM NAIL INSERTION: SHX2516

## 2013-08-26 LAB — URINALYSIS, ROUTINE W REFLEX MICROSCOPIC
Bilirubin Urine: NEGATIVE
GLUCOSE, UA: NEGATIVE mg/dL
Hgb urine dipstick: NEGATIVE
Ketones, ur: NEGATIVE mg/dL
Leukocytes, UA: NEGATIVE
Nitrite: NEGATIVE
PH: 5 (ref 5.0–8.0)
Protein, ur: NEGATIVE mg/dL
Specific Gravity, Urine: 1.022 (ref 1.005–1.030)
Urobilinogen, UA: 0.2 mg/dL (ref 0.0–1.0)

## 2013-08-26 LAB — TYPE AND SCREEN
ABO/RH(D): A POS
Antibody Screen: NEGATIVE
Unit division: 0
Unit division: 0

## 2013-08-26 LAB — POCT I-STAT, CHEM 8
BUN: 4 mg/dL — AB (ref 6–23)
CREATININE: 1.3 mg/dL — AB (ref 0.50–1.10)
Calcium, Ion: 1.12 mmol/L (ref 1.12–1.23)
Chloride: 105 mEq/L (ref 96–112)
GLUCOSE: 109 mg/dL — AB (ref 70–99)
HCT: 41 % (ref 36.0–46.0)
HEMOGLOBIN: 13.9 g/dL (ref 12.0–15.0)
POTASSIUM: 3.3 meq/L — AB (ref 3.7–5.3)
Sodium: 145 mEq/L (ref 137–147)
TCO2: 20 mmol/L (ref 0–100)

## 2013-08-26 LAB — PROTIME-INR
INR: 0.95 (ref 0.00–1.49)
Prothrombin Time: 12.5 seconds (ref 11.6–15.2)

## 2013-08-26 LAB — COMPREHENSIVE METABOLIC PANEL
ALBUMIN: 3.8 g/dL (ref 3.5–5.2)
ALT: 20 U/L (ref 0–35)
AST: 28 U/L (ref 0–37)
Alkaline Phosphatase: 68 U/L (ref 39–117)
BUN: 7 mg/dL (ref 6–23)
CHLORIDE: 103 meq/L (ref 96–112)
CO2: 21 mEq/L (ref 19–32)
Calcium: 8.3 mg/dL — ABNORMAL LOW (ref 8.4–10.5)
Creatinine, Ser: 0.94 mg/dL (ref 0.50–1.10)
GFR calc Af Amer: >90 mL/min (ref >90)
GFR calc non Af Amer: 83 mL/min — ABNORMAL LOW (ref >90)
Glucose, Bld: 109 mg/dL — ABNORMAL HIGH (ref 70–99)
Potassium: 3.5 mEq/L — ABNORMAL LOW (ref 3.7–5.3)
Sodium: 143 mEq/L (ref 137–147)
Total Bilirubin: 0.2 mg/dL — ABNORMAL LOW (ref 0.3–1.2)
Total Protein: 8.2 g/dL (ref 6.0–8.3)

## 2013-08-26 LAB — CBC
HEMATOCRIT: 35.4 % — AB (ref 36.0–46.0)
Hemoglobin: 11.6 g/dL — ABNORMAL LOW (ref 12.0–15.0)
MCH: 26.8 pg (ref 26.0–34.0)
MCHC: 32.8 g/dL (ref 30.0–36.0)
MCV: 81.8 fL (ref 78.0–100.0)
PLATELETS: 416 10*3/uL — AB (ref 150–400)
RBC: 4.33 MIL/uL (ref 3.87–5.11)
RDW: 16 % — ABNORMAL HIGH (ref 11.5–15.5)
WBC: 10.7 10*3/uL — ABNORMAL HIGH (ref 4.0–10.5)

## 2013-08-26 LAB — POCT I-STAT 3, ART BLOOD GAS (G3+)
Acid-base deficit: 6 mmol/L — ABNORMAL HIGH (ref 0.0–2.0)
BICARBONATE: 20.9 meq/L (ref 20.0–24.0)
O2 Saturation: 100 %
PCO2 ART: 39.9 mmHg (ref 35.0–45.0)
TCO2: 22 mmol/L (ref 0–100)
pH, Arterial: 7.317 — ABNORMAL LOW (ref 7.350–7.450)
pO2, Arterial: 305 mmHg — ABNORMAL HIGH (ref 80.0–100.0)

## 2013-08-26 LAB — RAPID URINE DRUG SCREEN, HOSP PERFORMED
Amphetamines: NOT DETECTED
Barbiturates: NOT DETECTED
Benzodiazepines: POSITIVE — AB
COCAINE: NOT DETECTED
Opiates: NOT DETECTED
Tetrahydrocannabinol: POSITIVE — AB

## 2013-08-26 LAB — TRIGLYCERIDES: Triglycerides: 111 mg/dL (ref <150)

## 2013-08-26 LAB — MRSA PCR SCREENING: MRSA by PCR: NEGATIVE

## 2013-08-26 LAB — PREGNANCY, URINE: Preg Test, Ur: NEGATIVE

## 2013-08-26 LAB — CG4 I-STAT (LACTIC ACID): Lactic Acid, Venous: 4.17 mmol/L — ABNORMAL HIGH (ref 0.5–2.2)

## 2013-08-26 LAB — ABO/RH: ABO/RH(D): A POS

## 2013-08-26 SURGERY — INSERTION, INTRAMEDULLARY ROD, TIBIA
Anesthesia: General | Site: Leg Upper | Laterality: Left

## 2013-08-26 MED ORDER — DEXTROSE 5 % IV SOLN
3.0000 g | INTRAVENOUS | Status: DC | PRN
  Administered 2013-08-26: 3 g via INTRAVENOUS

## 2013-08-26 MED ORDER — DEXTROSE-NACL 5-0.45 % IV SOLN: INTRAVENOUS | Status: DC

## 2013-08-26 MED ORDER — BIOTENE DRY MOUTH MT LIQD
15.0000 mL | Freq: Four times a day (QID) | OROMUCOSAL | Status: DC
  Administered 2013-08-26 – 2013-08-27 (×6): 15 mL via OROMUCOSAL

## 2013-08-26 MED ORDER — ONDANSETRON HCL 4 MG/2ML IJ SOLN
4.0000 mg | Freq: Four times a day (QID) | INTRAMUSCULAR | Status: DC | PRN
  Administered 2013-08-26 – 2013-08-27 (×3): 4 mg via INTRAVENOUS

## 2013-08-26 MED ORDER — FENTANYL CITRATE 0.05 MG/ML IJ SOLN
INTRAMUSCULAR | Status: AC
  Filled 2013-08-26: qty 2

## 2013-08-26 MED ORDER — METOCLOPRAMIDE HCL 5 MG/ML IJ SOLN
5.0000 mg | Freq: Three times a day (TID) | INTRAMUSCULAR | Status: DC | PRN
  Administered 2013-08-26: 5 mg via INTRAVENOUS
  Filled 2013-08-26 (×2): qty 2

## 2013-08-26 MED ORDER — 0.9 % SODIUM CHLORIDE (POUR BTL) OPTIME
TOPICAL | Status: DC | PRN
  Administered 2013-08-26: 1000 mL

## 2013-08-26 MED ORDER — METOCLOPRAMIDE HCL 5 MG PO TABS
5.0000 mg | ORAL_TABLET | Freq: Three times a day (TID) | ORAL | Status: DC | PRN
  Filled 2013-08-26: qty 2

## 2013-08-26 MED ORDER — PNEUMOCOCCAL VAC POLYVALENT 25 MCG/0.5ML IJ INJ
0.5000 mL | INJECTION | INTRAMUSCULAR | Status: DC
  Filled 2013-08-26: qty 0.5

## 2013-08-26 MED ORDER — CEFAZOLIN SODIUM-DEXTROSE 2-3 GM-% IV SOLR
2.0000 g | Freq: Four times a day (QID) | INTRAVENOUS | Status: AC
  Administered 2013-08-26 – 2013-08-27 (×3): 2 g via INTRAVENOUS
  Filled 2013-08-26 (×3): qty 50

## 2013-08-26 MED ORDER — FENTANYL CITRATE 0.05 MG/ML IJ SOLN
INTRAMUSCULAR | Status: DC | PRN
  Administered 2013-08-26: 100 ug via INTRAVENOUS
  Administered 2013-08-26: 150 ug via INTRAVENOUS

## 2013-08-26 MED ORDER — IOHEXOL 300 MG/ML  SOLN
125.0000 mL | Freq: Once | INTRAMUSCULAR | Status: AC | PRN
  Administered 2013-08-26: 120 mL via INTRAVENOUS

## 2013-08-26 MED ORDER — CHLORHEXIDINE GLUCONATE 0.12 % MT SOLN
15.0000 mL | Freq: Two times a day (BID) | OROMUCOSAL | Status: DC
  Administered 2013-08-26 – 2013-08-27 (×3): 15 mL via OROMUCOSAL
  Filled 2013-08-26 (×3): qty 15

## 2013-08-26 MED ORDER — INFLUENZA VAC SPLIT QUAD 0.5 ML IM SUSP
0.5000 mL | INTRAMUSCULAR | Status: DC
  Filled 2013-08-26: qty 0.5

## 2013-08-26 MED ORDER — CEFAZOLIN SODIUM-DEXTROSE 2-3 GM-% IV SOLR
INTRAVENOUS | Status: AC
  Filled 2013-08-26: qty 50

## 2013-08-26 MED ORDER — MIDAZOLAM HCL 2 MG/2ML IJ SOLN
2.0000 mg | Freq: Once | INTRAMUSCULAR | Status: AC
  Administered 2013-08-26: 2 mg via INTRAVENOUS
  Filled 2013-08-26: qty 2

## 2013-08-26 MED ORDER — KCL IN DEXTROSE-NACL 20-5-0.9 MEQ/L-%-% IV SOLN
INTRAVENOUS | Status: DC
Start: 2013-08-26 — End: 2013-08-31
  Administered 2013-08-26 – 2013-08-30 (×6): via INTRAVENOUS
  Filled 2013-08-26 (×11): qty 1000

## 2013-08-26 MED ORDER — FENTANYL CITRATE 0.05 MG/ML IJ SOLN
INTRAMUSCULAR | Status: DC | PRN
  Administered 2013-08-26 (×2): 100 ug via INTRAVENOUS

## 2013-08-26 MED ORDER — MIDAZOLAM HCL 5 MG/5ML IJ SOLN
INTRAMUSCULAR | Status: DC | PRN
  Administered 2013-08-26: 2 mg via INTRAVENOUS

## 2013-08-26 MED ORDER — ONDANSETRON HCL 4 MG PO TABS: 4.0000 mg | ORAL_TABLET | Freq: Four times a day (QID) | ORAL | Status: DC | PRN

## 2013-08-26 MED ORDER — PROPOFOL INFUSION 10 MG/ML OPTIME
INTRAVENOUS | Status: DC | PRN
  Administered 2013-08-26: 50 ug/kg/min via INTRAVENOUS

## 2013-08-26 MED ORDER — MIDAZOLAM HCL 2 MG/2ML IJ SOLN
2.0000 mg | INTRAMUSCULAR | Status: DC | PRN
  Administered 2013-08-27 (×2): 2 mg via INTRAVENOUS
  Filled 2013-08-26 (×2): qty 2

## 2013-08-26 MED ORDER — BACIT-POLY-NEO HC 1 % EX OINT
TOPICAL_OINTMENT | CUTANEOUS | Status: AC
  Filled 2013-08-26: qty 15

## 2013-08-26 MED ORDER — MIDAZOLAM HCL 2 MG/2ML IJ SOLN
INTRAMUSCULAR | Status: AC
  Administered 2013-08-26: 4 mg
  Filled 2013-08-26: qty 6

## 2013-08-26 MED ORDER — LIDOCAINE HCL (CARDIAC) 20 MG/ML IV SOLN
INTRAVENOUS | Status: AC | PRN
  Administered 2013-08-26: 100 mg via INTRAVENOUS

## 2013-08-26 MED ORDER — MIDAZOLAM HCL 2 MG/2ML IJ SOLN
INTRAMUSCULAR | Status: AC
  Administered 2013-08-26: 5 mg
  Filled 2013-08-26: qty 4

## 2013-08-26 MED ORDER — CEFAZOLIN SODIUM 1-5 GM-% IV SOLN
INTRAVENOUS | Status: AC
  Filled 2013-08-26: qty 50

## 2013-08-26 MED ORDER — ETOMIDATE 2 MG/ML IV SOLN
INTRAVENOUS | Status: DC | PRN
  Administered 2013-08-26: 20 mg via INTRAVENOUS

## 2013-08-26 MED ORDER — ROCURONIUM BROMIDE 100 MG/10ML IV SOLN
INTRAVENOUS | Status: DC | PRN
  Administered 2013-08-26: 50 mg via INTRAVENOUS

## 2013-08-26 MED ORDER — PROPOFOL 10 MG/ML IV EMUL
0.0000 ug/kg/min | INTRAVENOUS | Status: DC
  Administered 2013-08-26: 15 ug/kg/min via INTRAVENOUS
  Administered 2013-08-26 – 2013-08-27 (×5): 50 ug/kg/min via INTRAVENOUS
  Filled 2013-08-26 (×7): qty 100

## 2013-08-26 MED ORDER — PHENYLEPHRINE HCL 10 MG/ML IJ SOLN
INTRAMUSCULAR | Status: DC | PRN
  Administered 2013-08-26 (×2): 80 ug via INTRAVENOUS

## 2013-08-26 MED ORDER — ONDANSETRON HCL 4 MG/2ML IJ SOLN
4.0000 mg | Freq: Four times a day (QID) | INTRAMUSCULAR | Status: DC | PRN
  Filled 2013-08-26 (×3): qty 2

## 2013-08-26 MED ORDER — LACTATED RINGERS IV SOLN
INTRAVENOUS | Status: DC | PRN
  Administered 2013-08-26 (×2): via INTRAVENOUS

## 2013-08-26 MED ORDER — SODIUM CHLORIDE 0.9 % IV SOLN
Freq: Once | INTRAVENOUS | Status: AC
  Administered 2013-08-26: 05:00:00 via INTRAVENOUS

## 2013-08-26 MED ORDER — POTASSIUM CHLORIDE IN NACL 20-0.9 MEQ/L-% IV SOLN
INTRAVENOUS | Status: DC
  Filled 2013-08-26 (×2): qty 1000

## 2013-08-26 SURGICAL SUPPLY — 79 items
BANDAGE ELASTIC 4 VELCRO ST LF (GAUZE/BANDAGES/DRESSINGS) ×1 IMPLANT
BANDAGE ELASTIC 6 VELCRO ST LF (GAUZE/BANDAGES/DRESSINGS) ×1 IMPLANT
BANDAGE GAUZE ELAST BULKY 4 IN (GAUZE/BANDAGES/DRESSINGS) ×2 IMPLANT
BIT DRILL AO GAMMA 4.2X180 (BIT) ×1 IMPLANT
BIT DRILL AO GAMMA 4.2X340 (BIT) ×1 IMPLANT
BLADE SURG 10 STRL SS (BLADE) ×2 IMPLANT
BNDG CMPR MED 15X6 ELC VLCR LF (GAUZE/BANDAGES/DRESSINGS) ×1
BNDG COHESIVE 4X5 TAN STRL (GAUZE/BANDAGES/DRESSINGS) ×1 IMPLANT
BNDG ELASTIC 6X15 VLCR STRL LF (GAUZE/BANDAGES/DRESSINGS) ×1 IMPLANT
BNDG GAUZE ELAST 4 BULKY (GAUZE/BANDAGES/DRESSINGS) ×2 IMPLANT
CHLORAPREP W/TINT 26ML (MISCELLANEOUS) ×4 IMPLANT
CLOTH BEACON ORANGE TIMEOUT ST (SAFETY) ×1 IMPLANT
COVER MAYO STAND STRL (DRAPES) ×2 IMPLANT
COVER SURGICAL LIGHT HANDLE (MISCELLANEOUS) ×4 IMPLANT
CUFF TOURNIQUET SINGLE 34IN LL (TOURNIQUET CUFF) IMPLANT
CUFF TOURNIQUET SINGLE 44IN (TOURNIQUET CUFF) ×1 IMPLANT
DRAPE C-ARM 42X72 X-RAY (DRAPES) ×1 IMPLANT
DRAPE C-ARMOR (DRAPES) ×1 IMPLANT
DRAPE ORTHO SPLIT 77X108 STRL (DRAPES) ×2
DRAPE PROXIMA HALF (DRAPES) ×3 IMPLANT
DRAPE SURG ORHT 6 SPLT 77X108 (DRAPES) IMPLANT
DRAPE U-SHAPE 47X51 STRL (DRAPES) ×2 IMPLANT
DRESSING OPSITE X SMALL 2X3 (GAUZE/BANDAGES/DRESSINGS) ×1 IMPLANT
DRSG ADAPTIC 3X8 NADH LF (GAUZE/BANDAGES/DRESSINGS) ×4 IMPLANT
DRSG TEGADERM 4X4.75 (GAUZE/BANDAGES/DRESSINGS) ×2 IMPLANT
ELECT CAUTERY BLADE 6.4 (BLADE) ×2 IMPLANT
ELECT REM PT RETURN 9FT ADLT (ELECTROSURGICAL) ×2
ELECTRODE REM PT RTRN 9FT ADLT (ELECTROSURGICAL) ×1 IMPLANT
GLOVE BIO SURGEON STRL SZ 6.5 (GLOVE) ×1 IMPLANT
GLOVE BIO SURGEON STRL SZ7.5 (GLOVE) ×2 IMPLANT
GLOVE BIO SURGEON STRL SZ8.5 (GLOVE) ×1 IMPLANT
GLOVE BIOGEL PI IND STRL 6.5 (GLOVE) IMPLANT
GLOVE BIOGEL PI IND STRL 8 (GLOVE) ×2 IMPLANT
GLOVE BIOGEL PI INDICATOR 6.5 (GLOVE) ×2
GLOVE BIOGEL PI INDICATOR 8 (GLOVE) ×2
GLOVE ECLIPSE 8.0 STRL XLNG CF (GLOVE) ×1 IMPLANT
GLOVE ORTHO TXT STRL SZ7.5 (GLOVE) ×2 IMPLANT
GOWN PREVENTION PLUS LG XLONG (DISPOSABLE) IMPLANT
GOWN STRL NON-REIN LRG LVL3 (GOWN DISPOSABLE) ×4 IMPLANT
GUIDEWIRE GAMMA (WIRE) ×2 IMPLANT
GUIDEWIRE GAMMA 800 (WIRE) ×1 IMPLANT
KIT BASIN OR (CUSTOM PROCEDURE TRAY) ×2 IMPLANT
KIT ROOM TURNOVER OR (KITS) ×2 IMPLANT
MANIFOLD NEPTUNE II (INSTRUMENTS) ×1 IMPLANT
NAIL TIBIAL STANDARD 11X300MM (Nail) ×1 IMPLANT
NDL HYPO 21X1.5 SAFETY (NEEDLE) IMPLANT
NEEDLE HYPO 21X1.5 SAFETY (NEEDLE) IMPLANT
NS IRRIG 1000ML POUR BTL (IV SOLUTION) ×2 IMPLANT
PACK ORTHO EXTREMITY (CUSTOM PROCEDURE TRAY) ×2 IMPLANT
PAD ARMBOARD 7.5X6 YLW CONV (MISCELLANEOUS) ×4 IMPLANT
PAD CAST 4YDX4 CTTN HI CHSV (CAST SUPPLIES) ×1 IMPLANT
PADDING CAST COTTON 4X4 STRL (CAST SUPPLIES)
REAMER SHAFT BIXCUT (INSTRUMENTS) ×1 IMPLANT
SCREW LOCKING FULL THREAD 5X52 (Screw) ×1 IMPLANT
SCREW LOCKING T2 F/T  5MMX35MM (Screw) ×1 IMPLANT
SCREW LOCKING T2 F/T  5MMX40MM (Screw) ×1 IMPLANT
SCREW LOCKING T2 F/T  5MMX45MM (Screw) ×1 IMPLANT
SCREW LOCKING T2 F/T  5MMX65MM (Screw) ×1 IMPLANT
SCREW LOCKING T2 F/T 5MMX35MM (Screw) IMPLANT
SCREW LOCKING T2 F/T 5MMX40MM (Screw) IMPLANT
SCREW LOCKING T2 F/T 5MMX45MM (Screw) IMPLANT
SCREW LOCKING T2 F/T 5MMX65MM (Screw) IMPLANT
SPONGE GAUZE 4X4 12PLY (GAUZE/BANDAGES/DRESSINGS) ×1 IMPLANT
SPONGE GAUZE 4X4 12PLY STER LF (GAUZE/BANDAGES/DRESSINGS) ×2 IMPLANT
SPONGE LAP 18X18 X RAY DECT (DISPOSABLE) ×2 IMPLANT
STAPLER VISISTAT 35W (STAPLE) ×2 IMPLANT
SUT MON AB 2-0 CT1 27 (SUTURE) ×2 IMPLANT
SUT VIC AB 0 CT1 27 (SUTURE) ×2
SUT VIC AB 0 CT1 27XBRD ANBCTR (SUTURE) ×1 IMPLANT
SUT VIC AB 2-0 CT1 27 (SUTURE)
SUT VIC AB 2-0 CT1 TAPERPNT 27 (SUTURE) IMPLANT
SYR BULB IRRIGATION 50ML (SYRINGE) ×2 IMPLANT
SYR CONTROL 10ML LL (SYRINGE) IMPLANT
TOWEL OR 17X24 6PK STRL BLUE (TOWEL DISPOSABLE) ×2 IMPLANT
TOWEL OR 17X26 10 PK STRL BLUE (TOWEL DISPOSABLE) ×2 IMPLANT
TOWEL OR NON WOVEN STRL DISP B (DISPOSABLE) ×2 IMPLANT
TUBE CONNECTING 12X1/4 (SUCTIONS) ×2 IMPLANT
WATER STERILE IRR 1000ML POUR (IV SOLUTION) ×2 IMPLANT
YANKAUER SUCT BULB TIP NO VENT (SUCTIONS) ×2 IMPLANT
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
## 2013-08-26 NOTE — ED Notes (Signed)
Dr Dierdre Highmanpitz given a copy of lactic acid result 4.17
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
## 2013-08-26 NOTE — Transfer of Care (Signed)
Immediate Anesthesia Transfer of Care Note  Patient: Emma Green  Procedure(s) Performed: Procedure(s): INTRAMEDULLARY (IM) NAIL TIBIAL (Left)  Patient Location: NICU  Anesthesia Type:General  Level of Consciousness: Patient remains intubated per anesthesia plan  Airway & Oxygen Therapy: Patient remains intubated per anesthesia plan and Patient placed on Ventilator (see vital sign flow sheet for setting)  Post-op Assessment: Report given to PACU RN  Post vital signs: Reviewed and stable  Complications: No apparent anesthesia complications
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
## 2013-08-26 NOTE — Op Note (Signed)
08/26/2013  9:08 AM  PATIENT:  Emma Green    PRE-OPERATIVE DIAGNOSIS:  Left Leg Fracture  POST-OPERATIVE DIAGNOSIS:  Same  PROCEDURE:  INTRAMEDULLARY (IM) NAIL TIBIAL  SURGEON:  Ravin Denardo, D, MD  ASSISTANT: Odelia GageLindsey Anton PA  ANESTHESIA:   General  PREOPERATIVE INDICATIONS:  Emma Green is a  28 y.o. female with a diagnosis of Left Leg Fracture who failed conservative measures and elected for surgical management.    The risks benefits and alternatives were discussed with the patient preoperatively including but not limited to the risks of infection, bleeding, nerve injury, cardiopulmonary complications, the need for revision surgery, among others, and the patient was willing to proceed.  OPERATIVE IMPLANTS: Stryker T2 Tibial nail 11x315   BLOOD LOSS: minimal  COMPLICATIONS: none  TOURNIQUET TIME: none  OPERATIVE PROCEDURE:  Patient was identified in the preoperative holding area and site was marked by me He was transported to the operating theater and placed on the table in supine position taking care to pad all bony prominences. After a preincinduction time out anesthesia was induced. The Left lower extremity was prepped and draped in normal sterile fashion and a pre-incision timeout was performed. Emma Green received ancef 3G for preoperative antibiotics.   The leg was placed over the radiolucent triangle. I then made a 4 cm incision over the medial patella and patella tendon. I dissected down and incised the superficial capsule taking care to not penetrate into the joint itself.   I placed a guidewire under fluoroscopic guidance just medial to lateral tibial spine. As happy with his placement on all views. I used the entry reamer to create a path into the proximal tibia staying out of the joint itself.  Given the proximal nature of the fracture and its tendency to fall into flexion and elected to place a posterior blocking screw behind appearance  of the nail would help with reduction of the flexor the proximal segment the fracture. This is done after the guidepin was placed just placed directly behind the guidepin to the nail would affect the reduction.  I then passed the ball tip guidewire down the tibia and across the fracture site. I held appropriate reduction confirmed on multiple views of fluoroscopy and sequentially reamed up to an appropriate size with appropriate chatter. I selected the above-sized nail and passed it down the ball-tipped guidewire and seated it completely to a was sunk into the proximal tibia.  I then used the outrigger placed a static transverse and 2 oblique proximal locking screws in the transverse static screw. As happy with her length and placement on multiple oblique fluoroscopic views.  Next I confirmed appropriate rotation of the tibia with fracture tease and orientation of the patella and toes. I then used a perfect circles technique to place a distal static interlock screw.  The wounds were then all thoroughly irrigated and closed in layers. Sterile dressing was applied and he was taken to the PACU in stable condition.  POST OPERATIVE PLAN: TDWB, DVT prophylaxis: Early ambulation, SCD's, chemical per the trauma team

## 2013-08-26 NOTE — ED Notes (Signed)
Warm Normal saline hung per Dr Dierdre Highmanpitz
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
## 2013-08-26 NOTE — ED Notes (Signed)
Warm blankets applied

## 2013-08-26 NOTE — Anesthesia Postprocedure Evaluation (Signed)
  Anesthesia Post-op Note  Patient: Emma Green  Procedure(s) Performed: Procedure(s): INTRAMEDULLARY (IM) NAIL TIBIAL (Left)  Patient Location: SICU  Anesthesia Type:General  Level of Consciousness: sedated and Patient remains intubated per anesthesia plan  Airway and Oxygen Therapy: Patient remains intubated per anesthesia plan  Post-op Pain: none  Post-op Assessment: Post-op Vital signs reviewed  Post-op Vital Signs: stable  Complications: No apparent anesthesia complications
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
## 2013-08-26 NOTE — Preoperative (Signed)
Beta Blockers   Reason not to administer Beta Blockers:Not Applicable 

## 2013-08-26 NOTE — Progress Notes (Signed)
Bilateral colored contacts removed from pt's eyes without difficulty and placed in cups with saline.   11:50- contacts sent home with pt's Uncle.   Holly Bodilyulbertson, Harlo Jaso Leigh
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
## 2013-08-27 DIAGNOSIS — J95821 Acute postprocedural respiratory failure: Secondary | ICD-10-CM

## 2013-08-27 LAB — COMPREHENSIVE METABOLIC PANEL
ALK PHOS: 61 U/L (ref 39–117)
ALT: 13 U/L (ref 0–35)
AST: 32 U/L (ref 0–37)
Albumin: 2.9 g/dL — ABNORMAL LOW (ref 3.5–5.2)
BUN: 5 mg/dL — ABNORMAL LOW (ref 6–23)
CALCIUM: 7.4 mg/dL — AB (ref 8.4–10.5)
CO2: 22 meq/L (ref 19–32)
Chloride: 103 mEq/L (ref 96–112)
Creatinine, Ser: 0.73 mg/dL (ref 0.50–1.10)
GFR calc Af Amer: >90 mL/min (ref >90)
Glucose, Bld: 103 mg/dL — ABNORMAL HIGH (ref 70–99)
POTASSIUM: 4.4 meq/L (ref 3.7–5.3)
SODIUM: 139 meq/L (ref 137–147)
Total Bilirubin: 0.5 mg/dL (ref 0.3–1.2)
Total Protein: 6.7 g/dL (ref 6.0–8.3)

## 2013-08-27 LAB — CBC
HCT: 30.3 % — ABNORMAL LOW (ref 36.0–46.0)
Hemoglobin: 10.1 g/dL — ABNORMAL LOW (ref 12.0–15.0)
MCH: 27.6 pg (ref 26.0–34.0)
MCHC: 33.3 g/dL (ref 30.0–36.0)
MCV: 82.8 fL (ref 78.0–100.0)
PLATELETS: 294 10*3/uL (ref 150–400)
RBC: 3.66 MIL/uL — AB (ref 3.87–5.11)
RDW: 16.7 % — ABNORMAL HIGH (ref 11.5–15.5)
WBC: 10.6 10*3/uL — AB (ref 4.0–10.5)

## 2013-08-27 LAB — CDS SEROLOGY

## 2013-08-27 LAB — PROTIME-INR
INR: 1.17 (ref 0.00–1.49)
Prothrombin Time: 14.7 seconds (ref 11.6–15.2)

## 2013-08-27 MED ORDER — DIPHENHYDRAMINE HCL 25 MG PO CAPS
25.0000 mg | ORAL_CAPSULE | Freq: Four times a day (QID) | ORAL | Status: DC | PRN
  Administered 2013-08-28: 25 mg via ORAL
  Filled 2013-08-27: qty 1

## 2013-08-27 MED ORDER — SERTRALINE HCL 50 MG PO TABS
50.0000 mg | ORAL_TABLET | Freq: Every day | ORAL | Status: DC
  Administered 2013-08-27 – 2013-08-30 (×4): 50 mg via ORAL
  Filled 2013-08-27 (×6): qty 1

## 2013-08-27 MED ORDER — ENOXAPARIN SODIUM 30 MG/0.3ML ~~LOC~~ SOLN
30.0000 mg | Freq: Two times a day (BID) | SUBCUTANEOUS | Status: DC
  Administered 2013-08-27 – 2013-08-31 (×9): 30 mg via SUBCUTANEOUS
  Filled 2013-08-27 (×11): qty 0.3

## 2013-08-27 MED ORDER — SERTRALINE HCL 50 MG PO TABS
50.0000 mg | ORAL_TABLET | Freq: Every day | ORAL | Status: DC
  Filled 2013-08-27: qty 1

## 2013-08-27 MED ORDER — OXYCODONE HCL 5 MG PO TABS
5.0000 mg | ORAL_TABLET | ORAL | Status: DC | PRN
  Administered 2013-08-27: 5 mg via ORAL
  Administered 2013-08-27: 10 mg via ORAL
  Administered 2013-08-27: 5 mg via ORAL
  Administered 2013-08-28 – 2013-08-29 (×7): 10 mg via ORAL
  Administered 2013-08-29: 5 mg via ORAL
  Administered 2013-08-29 – 2013-08-30 (×5): 10 mg via ORAL
  Filled 2013-08-27 (×4): qty 2
  Filled 2013-08-27: qty 1
  Filled 2013-08-27: qty 2
  Filled 2013-08-27: qty 1
  Filled 2013-08-27 (×9): qty 2

## 2013-08-27 MED ORDER — HYDROMORPHONE HCL PF 1 MG/ML IJ SOLN
0.5000 mg | INTRAMUSCULAR | Status: DC | PRN
  Administered 2013-08-27 (×2): 1 mg via INTRAVENOUS
  Administered 2013-08-27 (×3): 0.5 mg via INTRAVENOUS
  Administered 2013-08-27 – 2013-08-30 (×8): 1 mg via INTRAVENOUS
  Filled 2013-08-27 (×13): qty 1
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
## 2013-08-27 NOTE — Procedures (Signed)
**Note De-Identified Zaveon Gillen Obfuscation** Extubation Procedure Note  Patient Details:   Name: Lady DeutscherChristina E Halbleib DOB: 04/09/1986 MRN: [id number]   Airway Documentation:  Airway 7.5 mm (Active)  Secured at (cm) 24 cm 08/27/2013  3:54 AM  Measured From Lips 08/27/2013  3:54 AM  Secured Location Center 08/27/2013  3:54 AM  Secured By [employer] Tube Holder 08/27/2013  3:54 AM  Tube Holder Repositioned Yes 08/27/2013  3:54 AM  Cuff Pressure (cm H2O) 22 cm H2O 08/27/2013  3:54 AM  Site Condition Dry 08/27/2013  3:54 AM    Evaluation  O2 sats: stable throughout Complications: No apparent complications Patient did tolerate procedure well. Bilateral Breath Sounds: Rhonchi Suctioning: Airway Yes + leak, no stridor noted Lauryl Seyer, Megan SalonWendy Cooper 08/27/2013, 12:50 PM
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
## 2013-08-27 NOTE — Progress Notes (Signed)
Rehab Admissions Coordinator Note:  Patient was screened by Trish MageLogue, Myiah Petkus M for appropriateness for an Inpatient Acute Rehab Consult.  At this time, we are recommending Inpatient Rehab consult.  Trish MageLogue, Anubis Fundora M 08/27/2013, 3:51 PM  I can be reached at [phone].
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
## 2013-08-27 NOTE — Progress Notes (Signed)
Subjective:  Patient is intubated and sedated  Objective:   VITALS:   Filed Vitals:   08/27/13 0956 08/27/13 1000 08/27/13 1055 08/27/13 1200  BP:  120/100 112/62 116/70  Pulse:  94 95 110  Temp:  100.2 F (37.9 C) 100.2 F (37.9 C) 100 F (37.8 C)  TempSrc:  Core (Comment)    Resp:  15 16 22   Height:      Weight:      SpO2: 100% 100% 100% 97%    Physical Exam LLE:  Dressing: C/D/I  Compartments soft  2+ DP   LABS  Results for orders placed during the hospital encounter of 08/26/13 (from the past 24 hour(s))  PREGNANCY, URINE     Status: None   Collection Time    08/26/13  1:00 PM      Result Value Range   Preg Test, Ur NEGATIVE  NEGATIVE  URINE RAPID DRUG SCREEN (HOSP PERFORMED)     Status: Abnormal   Collection Time    08/26/13  1:00 PM      Result Value Range   Opiates NONE DETECTED  NONE DETECTED   Cocaine NONE DETECTED  NONE DETECTED   Benzodiazepines POSITIVE (*) NONE DETECTED   Amphetamines NONE DETECTED  NONE DETECTED   Tetrahydrocannabinol POSITIVE (*) NONE DETECTED   Barbiturates NONE DETECTED  NONE DETECTED  URINALYSIS, ROUTINE W REFLEX MICROSCOPIC     Status: None   Collection Time    08/26/13  1:00 PM      Result Value Range   Color, Urine YELLOW  YELLOW   APPearance CLEAR  CLEAR   Specific Gravity, Urine 1.022  1.005 - 1.030   pH 5.0  5.0 - 8.0   Glucose, UA NEGATIVE  NEGATIVE mg/dL   Hgb urine dipstick NEGATIVE  NEGATIVE   Bilirubin Urine NEGATIVE  NEGATIVE   Ketones, ur NEGATIVE  NEGATIVE mg/dL   Protein, ur NEGATIVE  NEGATIVE mg/dL   Urobilinogen, UA 0.2  0.0 - 1.0 mg/dL   Nitrite NEGATIVE  NEGATIVE   Leukocytes, UA NEGATIVE  NEGATIVE  CBC     Status: Abnormal   Collection Time    08/27/13  5:40 AM      Result Value Range   WBC 10.6 (*) 4.0 - 10.5 K/uL   RBC 3.66 (*) 3.87 - 5.11 MIL/uL   Hemoglobin 10.1 (*) 12.0 - 15.0 g/dL   HCT 16.130.3 (*) 09.636.0 - 04.546.0 %   MCV 82.8  78.0 - 100.0 fL   MCH 27.6  26.0 - 34.0 pg   MCHC 33.3   30.0 - 36.0 g/dL   RDW 40.916.7 (*) 81.111.5 - 91.415.5 %   Platelets 294  150 - 400 K/uL  COMPREHENSIVE METABOLIC PANEL     Status: Abnormal   Collection Time    08/27/13  5:40 AM      Result Value Range   Sodium 139  137 - 147 mEq/L   Potassium 4.4  3.7 - 5.3 mEq/L   Chloride 103  96 - 112 mEq/L   CO2 22  19 - 32 mEq/L   Glucose, Bld 103 (*) 70 - 99 mg/dL   BUN 5 (*) 6 - 23 mg/dL   Creatinine, Ser 7.820.73  0.50 - 1.10 mg/dL   Calcium 7.4 (*) 8.4 - 10.5 mg/dL   Total Protein 6.7  6.0 - 8.3 g/dL   Albumin 2.9 (*) 3.5 - 5.2 g/dL   AST 32  0 - 37 U/L  ALT 13  0 - 35 U/L   Alkaline Phosphatase 61  39 - 117 U/L   Total Bilirubin 0.5  0.3 - 1.2 mg/dL   GFR calc non Af Amer >90  >90 mL/min   GFR calc Af Amer >90  >90 mL/min  PROTIME-INR     Status: None   Collection Time    08/27/13  5:40 AM      Result Value Range   Prothrombin Time 14.7  11.6 - 15.2 seconds   INR 1.17  0.00 - 1.49     Assessment/Plan: 1 Day Post-Op   Active Problems:   Closed left tibial fracture   Closed head injury   PLAN: Weight Bearing: TDWB LLE Dressings: Change per nursing 2/wk VTE prophylaxis: SCD's/TED, defer chemical px to trauma team in the setting of possible head injury, no orthopedic contraindication to chemical px.  Dispo: per trauma, no further orthopedic surgery planned.    Margarita Rana, D 08/27/2013, 12:23 PM   Margarita Rana, MD Cell [phone]

## 2013-08-27 NOTE — Progress Notes (Signed)
UR completed.  Markevius Trombetta, RN BSN MHA CCM Trauma/Neuro ICU Case Manager [phone]  

## 2013-08-28 LAB — BASIC METABOLIC PANEL
BUN: 4 mg/dL — ABNORMAL LOW (ref 6–23)
CHLORIDE: 104 meq/L (ref 96–112)
CO2: 25 mEq/L (ref 19–32)
Calcium: 7.5 mg/dL — ABNORMAL LOW (ref 8.4–10.5)
Creatinine, Ser: 0.74 mg/dL (ref 0.50–1.10)
GFR calc Af Amer: >90 mL/min (ref >90)
GFR calc non Af Amer: >90 mL/min (ref >90)
Glucose, Bld: 135 mg/dL — ABNORMAL HIGH (ref 70–99)
Potassium: 4 mEq/L (ref 3.7–5.3)
Sodium: 138 mEq/L (ref 137–147)

## 2013-08-28 LAB — CBC
HEMATOCRIT: 26 % — AB (ref 36.0–46.0)
Hemoglobin: 8.1 g/dL — ABNORMAL LOW (ref 12.0–15.0)
MCH: 26.2 pg (ref 26.0–34.0)
MCHC: 31.2 g/dL (ref 30.0–36.0)
MCV: 84.1 fL (ref 78.0–100.0)
Platelets: 264 10*3/uL (ref 150–400)
RBC: 3.09 MIL/uL — ABNORMAL LOW (ref 3.87–5.11)
RDW: 16.4 % — ABNORMAL HIGH (ref 11.5–15.5)
WBC: 9.9 10*3/uL (ref 4.0–10.5)

## 2013-08-28 MED ORDER — DOCUSATE SODIUM 100 MG PO CAPS
100.0000 mg | ORAL_CAPSULE | Freq: Two times a day (BID) | ORAL | Status: DC
  Administered 2013-08-28 – 2013-08-31 (×7): 100 mg via ORAL
  Filled 2013-08-28 (×10): qty 1

## 2013-08-28 MED ORDER — BISACODYL 10 MG RE SUPP
10.0000 mg | Freq: Every day | RECTAL | Status: DC | PRN
  Filled 2013-08-28: qty 1
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
## 2013-08-29 ENCOUNTER — Inpatient Hospital Stay (HOSPITAL_COMMUNITY): Payer: Medicaid Other

## 2013-08-29 LAB — CBC
HEMATOCRIT: 23 % — AB (ref 36.0–46.0)
Hemoglobin: 7.4 g/dL — ABNORMAL LOW (ref 12.0–15.0)
MCH: 26.6 pg (ref 26.0–34.0)
MCHC: 32.2 g/dL (ref 30.0–36.0)
MCV: 82.7 fL (ref 78.0–100.0)
PLATELETS: 239 10*3/uL (ref 150–400)
RBC: 2.78 MIL/uL — AB (ref 3.87–5.11)
RDW: 15.5 % (ref 11.5–15.5)
WBC: 7.7 10*3/uL (ref 4.0–10.5)

## 2013-08-29 MED ORDER — OXYCODONE-ACETAMINOPHEN 5-325 MG PO TABS
1.0000 | ORAL_TABLET | Freq: Four times a day (QID) | ORAL | Status: DC | PRN
  Administered 2013-08-29 – 2013-08-31 (×9): 2 via ORAL
  Filled 2013-08-29 (×9): qty 2

## 2013-08-29 NOTE — Progress Notes (Signed)
Dr Magnus IvanBlackman aware that pt PIV leaking and unable to run maintenance infusion and IV team attempted on restart with no success. Pt tolerating POs well and with no complications. Orders given for PICC placement, IV team paged and notified of new order. Deborah IV team stated that there was no PICC RN today at present and they hoped for RN this afternoon but it may be tomorrow before placed.
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
## 2013-08-30 DIAGNOSIS — IMO0002 Reserved for concepts with insufficient information to code with codable children: Secondary | ICD-10-CM

## 2013-08-30 DIAGNOSIS — S82109A Unspecified fracture of upper end of unspecified tibia, initial encounter for closed fracture: Secondary | ICD-10-CM

## 2013-08-30 DIAGNOSIS — S12400A Unspecified displaced fracture of fifth cervical vertebra, initial encounter for closed fracture: Secondary | ICD-10-CM

## 2013-08-30 DIAGNOSIS — D62 Acute posthemorrhagic anemia: Secondary | ICD-10-CM

## 2013-08-30 LAB — CBC
HCT: 21.2 % — ABNORMAL LOW (ref 36.0–46.0)
HEMOGLOBIN: 7 g/dL — AB (ref 12.0–15.0)
MCH: 26.8 pg (ref 26.0–34.0)
MCHC: 33 g/dL (ref 30.0–36.0)
MCV: 81.2 fL (ref 78.0–100.0)
Platelets: 269 10*3/uL (ref 150–400)
RBC: 2.61 MIL/uL — ABNORMAL LOW (ref 3.87–5.11)
RDW: 15.2 % (ref 11.5–15.5)
WBC: 7.4 10*3/uL (ref 4.0–10.5)

## 2013-08-30 LAB — PREPARE RBC (CROSSMATCH)

## 2013-08-30 MED ORDER — OXYCODONE HCL 5 MG PO TABS
5.0000 mg | ORAL_TABLET | ORAL | Status: DC | PRN
  Administered 2013-08-30 – 2013-08-31 (×5): 15 mg via ORAL
  Filled 2013-08-30 (×5): qty 3

## 2013-08-30 MED ORDER — OXYCODONE HCL 5 MG PO TABS
5.0000 mg | ORAL_TABLET | Freq: Once | ORAL | Status: AC
  Administered 2013-08-30: 5 mg via ORAL
  Filled 2013-08-30: qty 1

## 2013-08-30 NOTE — Progress Notes (Signed)
I participated in the care of this patient and agree with the above history, physical and evaluation. I performed a review of the history and a physical exam as detailed  **hgb of 7.0, will discuss need for blood transfusion with Trauma team.   Laqueta Dueimothy Daniel Quaran Kedzierski MD
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
## 2013-08-31 ENCOUNTER — Inpatient Hospital Stay (HOSPITAL_COMMUNITY): Payer: Medicaid Other

## 2013-08-31 ENCOUNTER — Inpatient Hospital Stay (HOSPITAL_COMMUNITY)
Admission: RE | Admit: 2013-08-31 | Discharge: 2013-09-04 | DRG: 945 | Disposition: A | Discharge status: Home / Self Care | Payer: Medicaid Other | Source: Intra-hospital | Attending: Physical Medicine & Rehabilitation | Admitting: Physical Medicine & Rehabilitation

## 2013-08-31 DIAGNOSIS — S82209A Unspecified fracture of shaft of unspecified tibia, initial encounter for closed fracture: Secondary | ICD-10-CM

## 2013-08-31 DIAGNOSIS — S0180XA Unspecified open wound of other part of head, initial encounter: Secondary | ICD-10-CM

## 2013-08-31 DIAGNOSIS — Z602 Problems related to living alone: Secondary | ICD-10-CM

## 2013-08-31 DIAGNOSIS — S82109A Unspecified fracture of upper end of unspecified tibia, initial encounter for closed fracture: Secondary | ICD-10-CM

## 2013-08-31 DIAGNOSIS — S82142P Displaced bicondylar fracture of left tibia, subsequent encounter for closed fracture with malunion: Secondary | ICD-10-CM

## 2013-08-31 DIAGNOSIS — F411 Generalized anxiety disorder: Secondary | ICD-10-CM

## 2013-08-31 DIAGNOSIS — G43909 Migraine, unspecified, not intractable, without status migrainosus: Secondary | ICD-10-CM

## 2013-08-31 DIAGNOSIS — S0990XS Unspecified injury of head, sequela: Secondary | ICD-10-CM

## 2013-08-31 DIAGNOSIS — IMO0002 Reserved for concepts with insufficient information to code with codable children: Secondary | ICD-10-CM

## 2013-08-31 DIAGNOSIS — S82409A Unspecified fracture of shaft of unspecified fibula, initial encounter for closed fracture: Secondary | ICD-10-CM

## 2013-08-31 DIAGNOSIS — S12400A Unspecified displaced fracture of fifth cervical vertebra, initial encounter for closed fracture: Secondary | ICD-10-CM

## 2013-08-31 DIAGNOSIS — F17200 Nicotine dependence, unspecified, uncomplicated: Secondary | ICD-10-CM

## 2013-08-31 DIAGNOSIS — D62 Acute posthemorrhagic anemia: Secondary | ICD-10-CM

## 2013-08-31 DIAGNOSIS — S82143A Displaced bicondylar fracture of unspecified tibia, initial encounter for closed fracture: Secondary | ICD-10-CM

## 2013-08-31 DIAGNOSIS — F329 Major depressive disorder, single episode, unspecified: Secondary | ICD-10-CM

## 2013-08-31 DIAGNOSIS — S82202S Unspecified fracture of shaft of left tibia, sequela: Secondary | ICD-10-CM

## 2013-08-31 DIAGNOSIS — E7800 Pure hypercholesterolemia, unspecified: Secondary | ICD-10-CM

## 2013-08-31 DIAGNOSIS — S025XXA Fracture of tooth (traumatic), initial encounter for closed fracture: Secondary | ICD-10-CM

## 2013-08-31 DIAGNOSIS — K5900 Constipation, unspecified: Secondary | ICD-10-CM

## 2013-08-31 DIAGNOSIS — F3289 Other specified depressive episodes: Secondary | ICD-10-CM

## 2013-08-31 DIAGNOSIS — F1210 Cannabis abuse, uncomplicated: Secondary | ICD-10-CM

## 2013-08-31 DIAGNOSIS — Z5189 Encounter for other specified aftercare: Principal | ICD-10-CM

## 2013-08-31 DIAGNOSIS — Z6841 Body Mass Index (BMI) 40.0 and over, adult: Secondary | ICD-10-CM

## 2013-08-31 LAB — TYPE AND SCREEN
ABO/RH(D): A POS
ANTIBODY SCREEN: NEGATIVE
UNIT DIVISION: 0
Unit division: 0

## 2013-08-31 LAB — URINALYSIS, ROUTINE W REFLEX MICROSCOPIC
Glucose, UA: NEGATIVE mg/dL
Hgb urine dipstick: NEGATIVE
Ketones, ur: 15 mg/dL — AB
Nitrite: NEGATIVE
PH: 6 (ref 5.0–8.0)
Protein, ur: 30 mg/dL — AB
Specific Gravity, Urine: >1.03 — ABNORMAL HIGH (ref 1.005–1.030)
Urobilinogen, UA: 1 mg/dL (ref 0.0–1.0)

## 2013-08-31 LAB — URINE MICROSCOPIC-ADD ON

## 2013-08-31 LAB — CBC
HCT: 24.7 % — ABNORMAL LOW (ref 36.0–46.0)
Hemoglobin: 7.9 g/dL — ABNORMAL LOW (ref 12.0–15.0)
MCH: 26.6 pg (ref 26.0–34.0)
MCHC: 32 g/dL (ref 30.0–36.0)
MCV: 83.2 fL (ref 78.0–100.0)
Platelets: 296 10*3/uL (ref 150–400)
RBC: 2.97 MIL/uL — AB (ref 3.87–5.11)
RDW: 15.5 % (ref 11.5–15.5)
WBC: 6.6 10*3/uL (ref 4.0–10.5)

## 2013-08-31 MED ORDER — METHOCARBAMOL 500 MG PO TABS
1000.0000 mg | ORAL_TABLET | Freq: Four times a day (QID) | ORAL | Status: DC | PRN
  Administered 2013-08-31: 1000 mg via ORAL
  Filled 2013-08-31: qty 2

## 2013-08-31 MED ORDER — PROCHLORPERAZINE 25 MG RE SUPP
12.5000 mg | Freq: Four times a day (QID) | RECTAL | Status: DC | PRN
  Filled 2013-08-31: qty 1

## 2013-08-31 MED ORDER — TRAMADOL HCL 50 MG PO TABS
50.0000 mg | ORAL_TABLET | Freq: Four times a day (QID) | ORAL | Status: DC | PRN
  Administered 2013-09-01 – 2013-09-04 (×8): 50 mg via ORAL
  Filled 2013-08-31 (×8): qty 1

## 2013-08-31 MED ORDER — BACITRACIN ZINC 500 UNIT/GM EX OINT
TOPICAL_OINTMENT | Freq: Every day | CUTANEOUS | Status: DC
  Administered 2013-08-31: 1 via TOPICAL
  Administered 2013-09-02 – 2013-09-04 (×2): via TOPICAL
  Filled 2013-08-31: qty 28.35

## 2013-08-31 MED ORDER — FLEET ENEMA 7-19 GM/118ML RE ENEM: 1.0000 | ENEMA | Freq: Once | RECTAL | Status: AC | PRN

## 2013-08-31 MED ORDER — GUAIFENESIN-DM 100-10 MG/5ML PO SYRP: 5.0000 mL | ORAL_SOLUTION | Freq: Four times a day (QID) | ORAL | Status: DC | PRN

## 2013-08-31 MED ORDER — NAPROXEN 500 MG PO TABS
500.0000 mg | ORAL_TABLET | Freq: Two times a day (BID) | ORAL | Status: DC
Start: 2013-08-31 — End: 2013-08-31
  Administered 2013-08-31: 500 mg via ORAL
  Filled 2013-08-31 (×2): qty 1

## 2013-08-31 MED ORDER — PROCHLORPERAZINE MALEATE 5 MG PO TABS
5.0000 mg | ORAL_TABLET | Freq: Four times a day (QID) | ORAL | Status: DC | PRN
  Filled 2013-08-31: qty 2

## 2013-08-31 MED ORDER — FERROUS SULFATE 325 (65 FE) MG PO TABS
325.0000 mg | ORAL_TABLET | Freq: Three times a day (TID) | ORAL | Status: DC
  Administered 2013-09-01 – 2013-09-04 (×9): 325 mg via ORAL
  Filled 2013-08-31 (×13): qty 1

## 2013-08-31 MED ORDER — METHOCARBAMOL 500 MG PO TABS: 1000.0000 mg | ORAL_TABLET | Freq: Four times a day (QID) | ORAL | Status: DC | PRN

## 2013-08-31 MED ORDER — ENOXAPARIN SODIUM 30 MG/0.3ML ~~LOC~~ SOLN
30.0000 mg | Freq: Two times a day (BID) | SUBCUTANEOUS | Status: DC
  Administered 2013-08-31 – 2013-09-04 (×8): 30 mg via SUBCUTANEOUS
  Filled 2013-08-31 (×9): qty 0.3

## 2013-08-31 MED ORDER — TRAZODONE HCL 50 MG PO TABS: 25.0000 mg | ORAL_TABLET | Freq: Every evening | ORAL | Status: DC | PRN

## 2013-08-31 MED ORDER — DIPHENHYDRAMINE HCL 25 MG PO CAPS: 25.0000 mg | ORAL_CAPSULE | Freq: Four times a day (QID) | ORAL | Status: DC | PRN

## 2013-08-31 MED ORDER — PROCHLORPERAZINE EDISYLATE 5 MG/ML IJ SOLN
5.0000 mg | Freq: Four times a day (QID) | INTRAMUSCULAR | Status: DC | PRN
  Filled 2013-08-31: qty 2

## 2013-08-31 MED ORDER — ALUM & MAG HYDROXIDE-SIMETH 200-200-20 MG/5ML PO SUSP: 30.0000 mL | ORAL | Status: DC | PRN

## 2013-08-31 MED ORDER — SENNOSIDES-DOCUSATE SODIUM 8.6-50 MG PO TABS
2.0000 | ORAL_TABLET | Freq: Every day | ORAL | Status: DC
  Administered 2013-08-31 – 2013-09-03 (×4): 2 via ORAL
  Filled 2013-08-31 (×4): qty 2

## 2013-08-31 MED ORDER — BISACODYL 10 MG RE SUPP: 10.0000 mg | Freq: Every day | RECTAL | Status: DC | PRN

## 2013-08-31 MED ORDER — OXYCODONE HCL 5 MG PO TABS
15.0000 mg | ORAL_TABLET | ORAL | Status: DC | PRN
Start: 2013-08-31 — End: 2013-09-04
  Administered 2013-08-31 – 2013-09-04 (×18): 15 mg via ORAL
  Filled 2013-08-31 (×18): qty 3

## 2013-08-31 MED ORDER — ACETAMINOPHEN 325 MG PO TABS: 325.0000 mg | ORAL_TABLET | ORAL | Status: DC | PRN

## 2013-08-31 MED ORDER — FERROUS SULFATE 325 (65 FE) MG PO TABS
325.0000 mg | ORAL_TABLET | Freq: Three times a day (TID) | ORAL | Status: DC
  Administered 2013-08-31: 325 mg via ORAL
  Filled 2013-08-31 (×3): qty 1

## 2013-08-31 MED ORDER — SERTRALINE HCL 50 MG PO TABS
50.0000 mg | ORAL_TABLET | Freq: Every day | ORAL | Status: DC
  Administered 2013-08-31 – 2013-09-03 (×4): 50 mg via ORAL
  Filled 2013-08-31 (×5): qty 1
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
## 2013-08-31 NOTE — Progress Notes (Signed)
    Subjective:  Patient reports pain as mild  Objective:   VITALS:   Filed Vitals:   08/30/13 1935 08/30/13 2035 08/30/13 2130 08/31/13 0548  BP: 130/83 133/68 135/70 132/70  Pulse: 102 102 97 94  Temp: 98.2 F (36.8 C) 98.4 F (36.9 C) 97.6 F (36.4 C) 98.5 F (36.9 C)  TempSrc: Oral Oral Oral Oral  Resp: 18 18 18 18   Height:      Weight:      SpO2: 100% 99% 100% 99%    Physical Exam LLE:   Dressing: C/D/I  Compartments soft  SILT DP/SP/S/S/T, 2+DP, +TA/GS/EHL Remaining exremities are atraumatic with Painless ROM and NVI  LABS  Results for orders placed during the hospital encounter of 08/26/13 (from the past 24 hour(s))  TYPE AND SCREEN     Status: None   Collection Time    08/30/13  3:35 PM      Result Value Range   ABO/RH(D) A POS     Antibody Screen NEG     Sample Expiration 09/02/2013     Unit Number [id number]     Blood Component Type RED CELLS,LR     Unit division 00     Status of [facility]     Transfusion Status OK TO TRANSFUSE     Crossmatch Result Compatible     Unit Number [id number]     Blood Component Type RED CELLS,LR     Unit division 00     Status of Unit ISSUED,FINAL     Transfusion Status OK TO TRANSFUSE     Crossmatch Result Compatible    PREPARE RBC (CROSSMATCH)     Status: None   Collection Time    08/30/13  3:35 PM      Result Value Range   Order Confirmation ORDER PROCESSED BY BLOOD BANK    CBC     Status: Abnormal   Collection Time    08/31/13  6:09 AM      Result Value Range   WBC 6.6  4.0 - 10.5 K/uL   RBC 2.97 (*) 3.87 - 5.11 MIL/uL   Hemoglobin 7.9 (*) 12.0 - 15.0 g/dL   HCT 34.724.7 (*) 42.536.0 - 95.646.0 %   MCV 83.2  78.0 - 100.0 fL   MCH 26.6  26.0 - 34.0 pg   MCHC 32.0  30.0 - 36.0 g/dL   RDW 38.715.5  56.411.5 - 33.215.5 %   Platelets 296  150 - 400 K/uL     Assessment/Plan: 5 Days Post-Op   Active Problems:   Closed left tibial fracture   Closed head injury   PLAN: Weight Bearing: TDWB LLE Dressings:  change per nursing every 3 days starting today VTE prophylaxis:  SCD's and lovenox per trauma Dispo: f/u with me in 1-2wks, please continue dvt px on discharge for 30 days post op.    Margarita RanaMURPHY, Markasia Carrol, D 08/31/2013, 12:43 PM   Margarita Ranaimothy Laela Deviney, MD Cell [phone]

## 2013-08-31 NOTE — Progress Notes (Signed)
SLP Cancellation Note  Patient Details Name: Lady DeutscherChristina E Sotto MRN: [id number] DOB: 01/12/1986   Cancelled treatment:        Order received for Speech Cognitive Evaluation.  Will defer evaluation to next venue of care, as pt. Is being discharged to CIR today and will receive Cognitive eval. By SLP on Rehab.   Maryjo RochesterWillis, Wana Mount T 08/31/2013, 1:36 PM

## 2013-08-31 NOTE — Progress Notes (Signed)
Hope to transfer to CIR today. I spoke with Delle ReiningPamela Love, PA from CIR regarding her medical issues.  Patient examined and I agree with the assessment and plan  Violeta GelinasBurke Horace Lukas, MD, MPH, FACS Pager: [phone]  08/31/2013 1:12 PM
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
## 2013-08-31 NOTE — Discharge Instructions (Signed)
Touch down weight on her Left leg  Take lovenox for 30 days post op
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
## 2013-08-31 NOTE — Progress Notes (Signed)
Rehab admissions - Evaluated for possible admission.  I met with patient and she would like to admit to acute inpatient rehab.  Bed available today and can admit to inpatient rehab today.  Call me for questions.  #[phone]

## 2013-08-31 NOTE — Progress Notes (Signed)
RN called report to CIR all questions answered. Patient transferred to room 6 on 4E
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
## 2013-08-31 NOTE — Discharge Summary (Signed)
   Emma GelinasBurke Kalayla Shadden, MD, MPH, FACS Pager: [phone]  08/31/2013 1:12 PM
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
## 2013-09-01 ENCOUNTER — Inpatient Hospital Stay (HOSPITAL_COMMUNITY): Payer: Medicaid Other | Admitting: *Deleted

## 2013-09-01 ENCOUNTER — Inpatient Hospital Stay (HOSPITAL_COMMUNITY): Payer: Medicaid Other | Admitting: Occupational Therapy

## 2013-09-01 ENCOUNTER — Encounter (HOSPITAL_COMMUNITY): Payer: Self-pay | Admitting: *Deleted

## 2013-09-01 DIAGNOSIS — S82209A Unspecified fracture of shaft of unspecified tibia, initial encounter for closed fracture: Secondary | ICD-10-CM

## 2013-09-01 DIAGNOSIS — S12400A Unspecified displaced fracture of fifth cervical vertebra, initial encounter for closed fracture: Secondary | ICD-10-CM

## 2013-09-01 DIAGNOSIS — S82109A Unspecified fracture of upper end of unspecified tibia, initial encounter for closed fracture: Secondary | ICD-10-CM

## 2013-09-01 DIAGNOSIS — IMO0002 Reserved for concepts with insufficient information to code with codable children: Secondary | ICD-10-CM

## 2013-09-01 LAB — CBC WITH DIFFERENTIAL/PLATELET
BASOS ABS: 0 10*3/uL (ref 0.0–0.1)
Basophils Relative: 0 % (ref 0–1)
EOS PCT: 3 % (ref 0–5)
Eosinophils Absolute: 0.2 10*3/uL (ref 0.0–0.7)
HEMATOCRIT: 24.7 % — AB (ref 36.0–46.0)
HEMOGLOBIN: 8.3 g/dL — AB (ref 12.0–15.0)
LYMPHS ABS: 1.5 10*3/uL (ref 0.7–4.0)
LYMPHS PCT: 17 % (ref 12–46)
MCH: 27.9 pg (ref 26.0–34.0)
MCHC: 33.6 g/dL (ref 30.0–36.0)
MCV: 82.9 fL (ref 78.0–100.0)
MONO ABS: 0.9 10*3/uL (ref 0.1–1.0)
MONOS PCT: 10 % (ref 3–12)
Neutro Abs: 5.9 10*3/uL (ref 1.7–7.7)
Neutrophils Relative %: 70 % (ref 43–77)
Platelets: 336 10*3/uL (ref 150–400)
RBC: 2.98 MIL/uL — AB (ref 3.87–5.11)
RDW: 15.9 % — ABNORMAL HIGH (ref 11.5–15.5)
WBC: 8.4 10*3/uL (ref 4.0–10.5)

## 2013-09-01 LAB — COMPREHENSIVE METABOLIC PANEL
ALK PHOS: 58 U/L (ref 39–117)
ALT: 18 U/L (ref 0–35)
AST: 32 U/L (ref 0–37)
Albumin: 2.9 g/dL — ABNORMAL LOW (ref 3.5–5.2)
BUN: 11 mg/dL (ref 6–23)
CHLORIDE: 99 meq/L (ref 96–112)
CO2: 27 mEq/L (ref 19–32)
Calcium: 8.6 mg/dL (ref 8.4–10.5)
Creatinine, Ser: 0.69 mg/dL (ref 0.50–1.10)
GFR calc Af Amer: >90 mL/min (ref >90)
GFR calc non Af Amer: >90 mL/min (ref >90)
Glucose, Bld: 94 mg/dL (ref 70–99)
POTASSIUM: 4.1 meq/L (ref 3.7–5.3)
SODIUM: 139 meq/L (ref 137–147)
TOTAL PROTEIN: 6.7 g/dL (ref 6.0–8.3)
Total Bilirubin: 0.6 mg/dL (ref 0.3–1.2)

## 2013-09-01 MED ORDER — ALPRAZOLAM 0.25 MG PO TABS
0.2500 mg | ORAL_TABLET | Freq: Three times a day (TID) | ORAL | Status: DC | PRN
  Administered 2013-09-01 – 2013-09-03 (×3): 0.25 mg via ORAL
  Filled 2013-09-01 (×3): qty 1

## 2013-09-01 MED ORDER — SORBITOL 70 % SOLN: 30.0000 mL | Freq: Every day | Status: DC | PRN

## 2013-09-01 MED ORDER — METHOCARBAMOL 500 MG PO TABS
1000.0000 mg | ORAL_TABLET | Freq: Four times a day (QID) | ORAL | Status: DC
  Administered 2013-09-01 – 2013-09-04 (×12): 1000 mg via ORAL
  Filled 2013-09-01 (×17): qty 2

## 2013-09-01 MED ORDER — INFLUENZA VAC SPLIT QUAD 0.5 ML IM SUSP
0.5000 mL | INTRAMUSCULAR | Status: AC
  Administered 2013-09-02: 0.5 mL via INTRAMUSCULAR
  Filled 2013-09-01: qty 0.5

## 2013-09-01 MED ORDER — PNEUMOCOCCAL VAC POLYVALENT 25 MCG/0.5ML IJ INJ
0.5000 mL | INJECTION | INTRAMUSCULAR | Status: AC
  Administered 2013-09-02: 0.5 mL via INTRAMUSCULAR
  Filled 2013-09-01: qty 0.5

## 2013-09-01 NOTE — Progress Notes (Signed)
Patient information reviewed and entered into eRehab system by Keonta Monceaux, RN, CRRN, PPS Coordinator.  Information including medical coding and functional independence measure will be reviewed and updated through discharge.    
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
## 2013-09-01 NOTE — Progress Notes (Signed)
Physical Therapy Session Note  Patient Details  Name: Emma DeutscherChristina E Cloninger MRN: [id number] Date of Birth: 11/09/1985  Today's Date: 09/01/2013 Time: 1300-1400 Time Calculation (min): 60 min  Short Term Goals: Week 1:  PT Short Term Goal 1 (Week 1): STGs=LTGs secondary to ELOS  Skilled Therapeutic Interventions/Progress Updates:  Patient received sitting on toilet. Patient had ambulated to bathroom without assistance. Re-educated patient about falls risk, safety within room, calling for assistance. Patient verbalized understanding, but stated that "sometimes it takes too long for someone to come." Re-emphasized importance of calling for assistance.  Session focused on functional transfers, wheelchair parts management, and LE therex. Patient able to return demonstrate w/c parts management with supervision/cuing. Supine LE therex: ankle pumps x25, emphasis on ROM of L ankle, heel slides 2x10 (emphasis on L knee ROM), hip abd 2x10, SAQs 2x10 w/ bolster, LAQs 2x10 seated EOM. Gait training 100' x1 with RW and supervision. Patient seated in wheelchair and handed off to OT at end of session.  Therapy Documentation Precautions:  Precautions Precautions: Cervical;Fall Required Braces or Orthoses: Cervical Brace Cervical Brace: Hard collar;At all times Restrictions Weight Bearing Restrictions: Yes LLE Weight Bearing: Touchdown weight bearing Pain: Pain Assessment Pain Assessment: 0-10 Pain Score: 6  Pain Type: Surgical pain Pain Location: Leg Pain Orientation: Left Pain Descriptors / Indicators: Aching;Sore Pain Onset: With Activity Patients Stated Pain Goal: 4 Pain Intervention(s): Ambulation/increased activity;Repositioned Multiple Pain Sites: No  See FIM for current functional status  Therapy/Group: Individual Therapy  Chipper HerbBridget S Molley Houser S. Alia Parsley, PT, DPT 09/01/2013, 3:48 PM

## 2013-09-01 NOTE — Progress Notes (Signed)
Occupational Therapy Session Note  Patient Details  Name: Emma Green MRN: [id number] Date of Birth: 06/22/1986  Today's Date: 09/01/2013 Time: 1405-1430 Time Calculation (min): 25 min  Short Term Goals: Week 1:  OT Short Term Goal 1 (Week 1): STG=LTG  Skilled Therapeutic Interventions/Progress Updates:    1:1 Introduced tub shower bench in ADL apartment as an option for accessing shower in home environment. Able to perform short distance functional ambulation with RW with close supervision. Continued practice on w/c management including removing and donning leg rest and propelling w/c for UB strengthening. Discussed decision and safety with being mod I in  Her room w/c <>chair/ bed/toileting. Pt demonstrated safe transfers while maintaining weight bearing status.   Therapy Documentation Precautions:  Precautions Precautions: Cervical;Fall Required Braces or Orthoses: Cervical Brace Cervical Brace: Hard collar;At all times Restrictions Weight Bearing Restrictions: Yes LLE Weight Bearing: Touchdown weight bearing Pain: Ongoing pain in left LE RN aware. Applied ice to LE after session See FIM for current functional status  Therapy/Group: Individual Therapy  Roney MansSmith, Denver Harder [facility] 09/01/2013, 3:12 PM

## 2013-09-01 NOTE — Progress Notes (Signed)
Patient admitted to rehab at 1900 via wheelchair from [address]. Patient alert and oriented x 4. Vital signs stable with no distress. adm

## 2013-09-02 ENCOUNTER — Inpatient Hospital Stay (HOSPITAL_COMMUNITY): Payer: Medicaid Other | Admitting: *Deleted

## 2013-09-02 ENCOUNTER — Encounter (HOSPITAL_COMMUNITY): Payer: Medicaid Other

## 2013-09-02 ENCOUNTER — Encounter (HOSPITAL_COMMUNITY): Payer: Medicaid Other | Admitting: Occupational Therapy

## 2013-09-02 ENCOUNTER — Inpatient Hospital Stay (HOSPITAL_COMMUNITY): Payer: Medicaid Other | Admitting: Occupational Therapy

## 2013-09-02 DIAGNOSIS — IMO0001 Reserved for inherently not codable concepts without codable children: Secondary | ICD-10-CM

## 2013-09-02 LAB — URINE CULTURE
CULTURE: NO GROWTH
Colony Count: NO GROWTH

## 2013-09-02 NOTE — Progress Notes (Signed)
Physical Therapy Session Note  Patient Details  Name: Emma Green MRN: [id number] Date of Birth: 10/11/1985  Today's Date: 09/02/2013 Time: [phone] and [id number] Time Calculation (min): 55 min and 15 min  Short Term Goals: Week 1:  PT Short Term Goal 1 (Week 1): STGs=LTGs secondary to ELOS  Skilled Therapeutic Interventions/Progress Updates:    AM Session: Patient received supine in bed. Patient with c/o of frequent bowel movements, stating she has been "going to the bathroom all morning." Patient with requests to stay in room for therapy in case she needs to use the bathroom quickly. Initiated HEP, provided handout for visual demo. In supine, patient performed ankle pumps and ankle inv/ev x25; AAROM heel slides with sheet 1x10. Patient requires 2 trips to the bathroom, but requests to stay in w/c instead of ambulating secondary to high pain levels in L LE. Before returning to bed to continue review of HEP, patient requiring to go to bathroom again. Patient states she needs to stay on toilet. Reviewed remainder of HEP with patient first using handout. Patient left sitting on toilet, encouraged to call for assistance if needed and patient verbalized understanding.  PM Session: Patient received sitting in wheelchair, returning from bathroom. Patient reports she is exhausted from going to/from bathroom all day and requests to get in bed to ice her leg. Patient mod I for w/c level transfers. Patient positioned in bed, assisted with elevating L LE and applying ice. Discussion about d/c planning, patient reporting ramp will be installed tomorrow at the latest. Patient left supine in bed with all needs within reach.  Therapy Documentation Precautions:  Precautions Precautions: Cervical;Fall Required Braces or Orthoses: Cervical Brace Cervical Brace: Hard collar;At all times Restrictions Weight Bearing Restrictions: Yes LLE Weight Bearing: Non weight bearing General: Amount of Missed PT  Time (min): 20 Minutes in AM; 15 minutes in PM Missed Time Reason: Other (comment) (toileting); fatigue Pain: Pain Assessment Pain Assessment: 0-10 Pain Score: 10-Worst pain ever Pain Type: Surgical pain Pain Location: Leg Pain Orientation: Right Pain Descriptors / Indicators: Aching;Sore Pain Onset: Gradual Pain Intervention(s): RN made aware;Repositioned;Ambulation/increased activity Multiple Pain Sites: No Locomotion : Ambulation Ambulation/Gait Assistance: Not tested (comment)   See FIM for current functional status  Therapy/Group: Individual Therapy  Chipper HerbBridget S Cyntia Green Emma Green, PT, DPT 09/02/2013, 9:28 AM

## 2013-09-02 NOTE — Progress Notes (Signed)
[facility] Individual Statement of Services  Patient Name:  Emma Green  Date:  09/02/2013  Welcome to the [facility].  Our goal is to provide you with an individualized program based on your diagnosis and situation, designed to meet your specific needs.  With this comprehensive rehabilitation program, you will be expected to participate in at least 3 hours of rehabilitation therapies Monday-Friday, with modified therapy programming on the weekends.  Your rehabilitation program will include the following services:  Physical Therapy (PT), Occupational Therapy (OT), Speech Therapy (ST), 24 hour per day rehabilitation nursing, Therapeutic Recreaction (TR), Neuropsychology, Case Management (Social Worker), Rehabilitation Medicine, Nutrition Services and Pharmacy Services  Weekly team conferences will be held on Tuesdays to discuss your progress.  Your Social Worker will talk with you frequently to get your input and to update you on team discussions.  Team conferences with you and your family in attendance may also be held.  Expected length of stay: 5-7 days  Overall anticipated outcome: modified independent  Depending on your progress and recovery, your program may change. Your Social Worker will coordinate services and will keep you informed of any changes. Your Social Worker's name and contact numbers are listed  below.  The following services may also be recommended but are not provided by the [facility]:   Driving Evaluations  Home Health Rehabiltiation Services  Outpatient Rehabilitation Services  Vocational Rehabilitation   Arrangements will be made to provide these services after discharge if needed.  Arrangements include referral to agencies that provide these services.  Your insurance has been verified to be:  Medicaid [employer] [facility]  Pertinent information  will be shared with your doctor and your insurance company.  Social Worker:  [place], [place] [phone] or ([phone]) [phone]   Information discussed with and copy given to patient by: Amada JupiterHOYLE, Dorisann Schwanke, 09/02/2013, 10:47 AM

## 2013-09-02 NOTE — Progress Notes (Signed)
Occupational Therapy Session Note  Patient Details  Name: Lady DeutscherChristina E Hardaway MRN: [id number] Date of Birth: 08/05/1986  Today's Date: 09/02/2013 Time: [id number] Time Calculation (min): 28 min  Short Term Goals: Week 1:  OT Short Term Goal 1 (Week 1): STG=LTG  Pain: 5/10 in BLE; RN aware and repositioned  Skilled Therapeutic Interventions/Progress Updates:    Treatment focus on bed<>w/c transfers and discharge planning.  Pt stated that she has had diarrhea all day and didn't want to leave room.  Pt stated that her RLE was still painful from the shower in the morning. Pt did not exhibit any unsafe behavior during session.  Pt transferred to toilet/BSC in bathroom at end of session. Therapy Documentation Precautions:  Precautions Precautions: Cervical;Fall Required Braces or Orthoses: Cervical Brace Cervical Brace: Hard collar;At all times Restrictions Weight Bearing Restrictions: Yes LLE Weight Bearing: Non weight bearing     See FIM for current functional status  Therapy/Group: Individual Therapy  Rich BraveLanier, Jizel Cheeks Chappell 09/02/2013, 2:44 PM

## 2013-09-02 NOTE — Plan of Care (Signed)
Problem: RH SAFETY Goal: RH STG DECREASED RISK OF FALL WITH ASSISTANCE STG Decreased Risk of Fall With Assistance. Mod I  W/c level

## 2013-09-02 NOTE — Progress Notes (Signed)
Occupational Therapy Session Note  Patient Details  Name: Lady DeutscherChristina E Trovato MRN: [id number] Date of Birth: 02/06/1986  Today's Date: 09/02/2013 Time: [id number] Time Calculation (min): 28 min  Short Term Goals: Week 1:  OT Short Term Goal 1 (Week 1): STG=LTG  Skilled Therapeutic Interventions/Progress Updates:    1:1 self care retraining at shower level in room with focus on short distance functional ambulation, sit to stands, standing balance, transfers, bathing, dressing etc. Educated pt and pt's mother on how to change pads in the collar with pt supine in bed and how to properly clean pads. Pt left in bed for nursing to change bandages and assess pain.  Therapy Documentation Precautions:  Precautions Precautions: Cervical;Fall Required Braces or Orthoses: Cervical Brace Cervical Brace: Hard collar;At all times Restrictions Weight Bearing Restrictions: Yes LLE Weight Bearing: Non weight bearing Pain:  c/o pain in bilateral LEs - RN notified   See FIM for current functional status  Therapy/Group: Individual Therapy  Roney MansSmith, [facility] 09/02/2013, 2:45 PM
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
## 2013-09-02 NOTE — Plan of Care (Signed)
Problem: RH PAIN MANAGEMENT Goal: RH STG PAIN MANAGED AT OR BELOW PT'S PAIN GOAL Pain less than or equal to 9  Outcome: Progressing Poor pain controlled with scheduled Oxy IR 10mg  q 4hrs prn, Robaxin 1000 mg, Tramadol 50mg  q 6hrs, prn

## 2013-09-02 NOTE — Progress Notes (Signed)
Late entry: Pt stated that she had watched video regarding self administration of Lovenox injection. Pt verbalized steps and return demonstrated administration of injection to RUQ.
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
## 2013-09-03 ENCOUNTER — Ambulatory Visit (HOSPITAL_COMMUNITY): Payer: Medicaid Other

## 2013-09-03 ENCOUNTER — Inpatient Hospital Stay (HOSPITAL_COMMUNITY): Payer: Medicaid Other | Admitting: *Deleted

## 2013-09-03 ENCOUNTER — Encounter (HOSPITAL_COMMUNITY): Payer: Medicaid Other

## 2013-09-03 MED ORDER — SENNOSIDES-DOCUSATE SODIUM 8.6-50 MG PO TABS: 2.0000 | ORAL_TABLET | Freq: Every day | ORAL | Status: DC

## 2013-09-03 MED ORDER — FERROUS SULFATE 325 (65 FE) MG PO TABS
325.0000 mg | ORAL_TABLET | Freq: Three times a day (TID) | ORAL | Status: DC
Start: 2013-09-03 — End: 2015-02-16

## 2013-09-03 MED ORDER — TRAMADOL HCL 50 MG PO TABS: 50.0000 mg | ORAL_TABLET | Freq: Four times a day (QID) | ORAL | Status: DC | PRN

## 2013-09-03 MED ORDER — ENOXAPARIN SODIUM 30 MG/0.3ML ~~LOC~~ SOLN: 30.0000 mg | Freq: Two times a day (BID) | SUBCUTANEOUS | Status: DC

## 2013-09-03 MED ORDER — ALPRAZOLAM 0.25 MG PO TABS: 0.2500 mg | ORAL_TABLET | Freq: Three times a day (TID) | ORAL | Status: DC | PRN

## 2013-09-03 MED ORDER — OXYCODONE HCL 15 MG PO TABS: 15.0000 mg | ORAL_TABLET | Freq: Four times a day (QID) | ORAL | Status: DC | PRN

## 2013-09-03 MED ORDER — METHOCARBAMOL 500 MG PO TABS: 500.0000 mg | ORAL_TABLET | Freq: Four times a day (QID) | ORAL | Status: DC | PRN

## 2013-09-03 NOTE — IPOC Note (Signed)
Overall Plan of Care [facility](IPOC) Patient Details Name: Emma DeutscherChristina E Green MRN: [id number] DOB: 11/09/1985  Admitting Diagnosis: lt tib fx  Hospital Problems: Active Problems:   Tibial plateau fracture     Functional Problem List: Nursing Edema;Pain;Skin Integrity  PT Balance;Endurance;Motor;Pain  OT Balance;Behavior;Edema;Endurance;Motor;Pain;Safety;Skin Integrity  SLP    TR         Basic ADL's: OT Grooming;Bathing;Dressing;Toileting     Advanced  ADL's: OT Simple Meal Preparation     Transfers: PT Bed Mobility;Bed to Chair;Car;Furniture  OT Toilet;Tub/Shower     Locomotion: PT Ambulation;Wheelchair Mobility;Stairs     Additional Impairments: OT None  SLP        TR      Anticipated Outcomes Item Anticipated Outcome  Self Feeding no goal  Swallowing      Basic self-care  mod I   Toileting  mod I    Bathroom Transfers mod I   Bowel/Bladder  min assist  Transfers  mod I  Locomotion  mod I, supervision for stairs  Communication     Cognition     Pain  pain less than or equal to 3  Safety/Judgment  Mod I   Therapy Plan: PT Intensity: Minimum of 1-2 x/day ,45 to 90 minutes PT Frequency: 5 out of 7 days PT Duration Estimated Length of Stay: 5 days OT Intensity: Minimum of 1-2 x/day, 45 to 90 minutes OT Frequency: 5 out of 7 days OT Duration/Estimated Length of Stay: 5 days         Team Interventions: Nursing Interventions Patient/Family Education;Pain Management;Skin Care/Wound Management;Medication Management  PT interventions Ambulation/gait training;Balance/vestibular training;Community reintegration;Discharge planning;Neuromuscular re-education;Functional mobility training;DME/adaptive equipment instruction;Pain management;Patient/family education;Psychosocial support;Skin care/wound management;Splinting/orthotics;UE/LE Coordination activities;UE/LE Strength taining/ROM;Therapeutic Exercise;Therapeutic Activities;Wheelchair  propulsion/positioning;Stair training  OT Interventions Balance/vestibular training;Community reintegration;Discharge planning;Disease mangement/prevention;DME/adaptive equipment instruction;Neuromuscular re-education;Patient/family education;Pain management;Psychosocial support;Self Care/advanced ADL retraining;Splinting/orthotics;Therapeutic Exercise;UE/LE Coordination activities;UE/LE Strength taining/ROM;Therapeutic Activities  SLP Interventions    TR Interventions    SW/CM Interventions Discharge Planning;Psychosocial Support;Patient/Family Education    Team Discharge Planning: Destination: PT-Home ,OT- Home , SLP-Home Projected Follow-up: PT-Outpatient PT, OT-  None, SLP-None Projected Equipment Needs: PT-Wheelchair (measurements);Wheelchair cushion (measurements);Rolling walker with 5" wheels, OT- Wheelchair (measurements);Rolling walker with 5" wheels, SLP-None recommended by SLP Equipment Details: PT- , OT-  Patient/family involved in discharge planning: PT- Patient,  OT-Patient, SLP-Patient  MD ELOS: 09/04/13 Medical Rehab Prognosis:  Excellent Assessment: The patient has been admitted for CIR therapies. The team will be addressing, functional mobility, strength, stamina, balance, safety, adaptive techniques/equipment, self-care, bowel and bladder mgt, patient and caregiver education, pain mgt, ortho precautions. Goals have been set at mod I with mobility and self-care.    Ranelle OysterZachary T. Erroll Wilbourne, MD, FAAPMR      See Team Conference Notes for weekly updates to the plan of care
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
## 2013-09-03 NOTE — Progress Notes (Signed)
Occupational Therapy Session Note  Patient Details  Name: Lady DeutscherChristina E Valenza MRN: [id number] Date of Birth: 04/11/1986  Today's Date: 09/03/2013  Session 1 Time: [id number] Time Calculation (min): 58 min  Short Term Goals: Week 1:  OT Short Term Goal 1 (Week 1): STG=LTG  Skilled Therapeutic Interventions/Progress Updates:    Pt engaged in bathing at shower level and dressing with sit<>stand from w/c.  Pt bathed in walk in shower this morning secondary to both tub rooms in use.  Pt placed Rt foot on small trash can to elevate it and stated this alleviated the pain in RLE. Pt completed all tasks with mod I at w/c level.  Focus on activity tolerance, transfers, standing balance, safety awareness, and discharge planning.   Therapy Documentation Precautions:  Precautions Precautions: Cervical;Fall Required Braces or Orthoses: Cervical Brace Cervical Brace: Hard collar;At all times Restrictions Weight Bearing Restrictions: Yes LLE Weight Bearing: Touchdown weight bearing   Pain: Pain Assessment Pain Assessment: 0-10 Pain Score: 5  Pain Type: Acute pain Pain Location: Leg Pain Orientation: Left;Right Pain Descriptors / Indicators: Aching Pain Onset: On-going Patients Stated Pain Goal: 6 Pain Intervention(s): RN made aware;Repositioned  See FIM for current functional status  Therapy/Group: Individual Therapy  Session 2 Time: 1300-1315 Pt denies pain Individual Therapy  Pt practiced functional amb with RW to access bathroom and practiced toilet transfers using RW.  Pt stated she needed to use toilet and needed to remain on toilet for awhile.  Pt was mod I for toilet transfers using RW.  Session ended early secondary patient's request to remain on toilet. Pt missed 15 mins OT services.  Lavone NeriLanier, Hodge Stachnik [facility] 09/03/2013, 10:47 AM
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
## 2013-09-03 NOTE — Progress Notes (Signed)
Physical Therapy Discharge Summary  Patient Details  Name: LUVERNE FARONE MRN: [id number] Date of Birth: 30-Aug-1985  Today's Date: 09/03/2013 Time: 1100-1200 Time Calculation (min): 60 min, missed 60 min  Patient has met 9 of 9 long term goals due to improved activity tolerance, improved balance, increased strength, increased range of motion, decreased pain, ability to compensate for deficits, functional use of  left lower extremity and improved coordination.  Patient to discharge at a wheelchair level Modified Independent in the community, ambulatory level mod I for household ambulation.  Patient's care partner not necessary secondary to patient discharging at mod I level. to provide the necessary assistance at discharge.  Reasons goals not met: N/A, all LTGs met.  Recommendation:  Patient will benefit from ongoing skilled PT services in outpatient setting to continue to advance safe functional mobility, address ongoing impairments in strength, balance, coordination, gait, and minimize fall risk.  Equipment: wheelchair w/ elevating leg rests, wheelchair cushion, RW  Reasons for discharge: treatment goals met and discharge from hospital  Patient/family agrees with progress made and goals achieved: Yes  Skilled Interventions: Sessions today focused on all functional mobility in preparation for discharge; see details below. Reviewed HEP handout provided to patient yesterday. Patient without further questions at this time. Patient performed car transfer with supervision/set up assistance for RW and wheelchair management.  Patient declined to participate in PM therapy session secondary to c/o having to go to/from bathroom multiple times. Unable to assess patient ability to negotiate ramp in wheelchair secondary to patient's refusal.  PT Discharge Precautions/Restrictions Precautions Precautions: Cervical;Fall Required Braces or Orthoses: Cervical Brace Cervical Brace: Hard collar;At  all times Restrictions Weight Bearing Restrictions: Yes LLE Weight Bearing: Touchdown weight bearing Pain Pain Assessment Pain Assessment: 0-10 Pain Score: 9  Pain Type: Surgical pain Pain Location: Leg Pain Orientation: Left Pain Descriptors / Indicators: Sore;Aching Pain Onset: On-going Patients Stated Pain Goal: 6 Pain Intervention(s): RN made aware;Repositioned;Ambulation/increased activity Multiple Pain Sites: No Vision/Perception  Vision - History Baseline Vision: No visual deficits Patient Visual Report: No change from baseline Vision - Assessment Eye Alignment: Within Functional Limits Perception Perception: Within Functional Limits Praxis Praxis: Intact  Cognition Overall Cognitive Status: Within Functional Limits for tasks assessed Arousal/Alertness: Awake/alert Orientation Level: Oriented X4 Attention: Divided Selective Attention: Appears intact Alternating Attention: Appears intact Divided Attention: Appears intact Memory: Appears intact Awareness: Appears intact Problem Solving: Appears intact Safety/Judgment: Appears intact Sensation Sensation Light Touch: Appears Intact Proprioception: Appears Intact Coordination Gross Motor Movements are Fluid and Coordinated: Yes Fine Motor Movements are Fluid and Coordinated: Yes Motor  Motor Motor: Within Functional Limits Motor - Discharge Observations: limited by pain  Mobility Bed Mobility Bed Mobility: Supine to Sit;Sit to Supine Supine to Sit: HOB flat;6: Modified independent (Device/Increase time) Sit to Supine: HOB flat;6: Modified independent (Device/Increase time) Transfers Transfers: Yes Sit to Stand: From chair/3-in-1;From bed;From toilet;With armrests;With upper extremity assist;6: Modified independent (Device/Increase time) Stand to Sit: To chair/3-in-1;To toilet;To bed;With upper extremity assist;With armrests;6: Modified independent (Device/Increase time) Stand Pivot Transfers: 6: Modified  independent (Device/Increase time);With armrests Locomotion  Ambulation Ambulation: Yes Ambulation/Gait Assistance: 6: Modified independent (Device/Increase time) Ambulation Distance (Feet): 150 Feet Assistive device: Rolling walker Ambulation/Gait Assistance Details: 18' x1 with RW and mod I in controlled environment; 30'x1 with RW and mod I in ADL apartment to simulate home environment. Patient able to maintain TDWB without cues. Gait Gait: Yes Gait Pattern: Impaired Gait Pattern: Step-to pattern;Decreased stride length Stairs / Additional Locomotion Curb: 5: Supervision (w/  RW) Architect: Yes Wheelchair Assistance: 6: Modified independent (Device/Increase time) Environmental health practitioner: Both upper extremities Wheelchair Parts Management: Independent Distance: 150  Trunk/Postural Assessment  Cervical Assessment Cervical Assessment: Within Functional Limits Thoracic Assessment Thoracic Assessment: Within Functional Limits Lumbar Assessment Lumbar Assessment: Within Functional Limits Postural Control Postural Control: Within Functional Limits  Balance Balance Balance Assessed: Yes Static Sitting Balance Static Sitting - Balance Support: Feet supported;No upper extremity supported;Feet unsupported Static Sitting - Level of Assistance: 6: Modified independent (Device/Increase time) Static Standing Balance Static Standing - Balance Support: Bilateral upper extremity supported;During functional activity;No upper extremity supported Static Standing - Level of Assistance: 6: Modified independent (Device/Increase time) Dynamic Standing Balance Dynamic Standing - Balance Support: Bilateral upper extremity supported;No upper extremity supported;During functional activity Dynamic Standing - Level of Assistance: 6: Modified independent (Device/Increase time) Extremity Assessment  RLE Assessment RLE Assessment: Within Functional Limits (5/5) LLE  Assessment LLE Assessment: Exceptions to [facility] LLE Strength LLE Overall Strength: Deficits;Due to pain LLE Overall Strength Comments: Grossly 2+/5; limited by pain  See FIM for current functional status  Vincent. Chayanne Filippi, PT, DPT 09/03/2013, 2:19 PM

## 2013-09-04 DIAGNOSIS — IMO0002 Reserved for concepts with insufficient information to code with codable children: Secondary | ICD-10-CM

## 2013-09-04 DIAGNOSIS — S8290XS Unspecified fracture of unspecified lower leg, sequela: Secondary | ICD-10-CM
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
## 2013-09-04 NOTE — Progress Notes (Signed)
Occupational Therapy Discharge Summary  Patient Details  Name: Emma Green MRN: [id number] Date of Birth: 24-Oct-1985  Today's Date: 09/04/2013    Patient has met 9 of 9 long term goals due to improved activity tolerance, improved balance, postural control, ability to compensate for deficits and improved coordination.  Patient to discharge at overall Modified Independent level.  Patient's care partner is independent to provide the necessary physical and with managaement of c- collar assistance at discharge.    Reasons goals not met: n/a  Recommendation:  No f/u at this time  Equipment: tub bench, RW, w/c with cushion  Reasons for discharge: treatment goals met and discharge from hospital  Patient/family agrees with progress made and goals achieved: Yes  OT Discharge Precautions/Restrictions  Precautions Precautions: Cervical;Fall Required Braces or Orthoses: Cervical Brace Cervical Brace: Hard collar;At all times Restrictions Weight Bearing Restrictions: Yes LLE Weight Bearing: Touchdown weight bearing General   Vital Signs   Pain   ADL   Vision/Perception  Vision - History Baseline Vision: No visual deficits Patient Visual Report: No change from baseline Vision - Assessment Additional Comments: functionally WFL however in formal testing with neuropysch pt with difficulty with vision (unware to pt)  Cognition Overall Cognitive Status: Within Functional Limits for tasks assessed Arousal/Alertness: Awake/alert Orientation Level: Oriented X4 Safety/Judgment: Appears intact [employer] Scales of Cognitive Functioning: Purposeful/appropriate Sensation Sensation Light Touch: Appears Intact Stereognosis: Appears Intact Proprioception: Appears Intact Coordination Gross Motor Movements are Fluid and Coordinated: Yes Fine Motor Movements are Fluid and Coordinated: Yes Motor  Motor Motor: Within Functional Limits Motor - Discharge Observations: limited  by pain Mobility  Bed Mobility Bed Mobility: Supine to Sit;Sit to Supine Sit to Supine: HOB flat;6: Modified independent (Device/Increase time) Transfers Sit to Stand: From chair/3-in-1;From bed;From toilet;With armrests;With upper extremity assist;6: Modified independent (Device/Increase time) Stand to Sit: To chair/3-in-1;To toilet;To bed;With upper extremity assist;With armrests;6: Modified independent (Device/Increase time)  Trunk/Postural Assessment  Cervical Assessment Cervical Assessment: Within Functional Limits Thoracic Assessment Thoracic Assessment: Within Functional Limits Lumbar Assessment Lumbar Assessment: Within Functional Limits Postural Control Postural Control: Within Functional Limits  Balance Static Sitting Balance Static Sitting - Level of Assistance: 7: Independent Static Standing Balance Static Standing - Level of Assistance: 6: Modified independent (Device/Increase time) Dynamic Standing Balance Dynamic Standing - Level of Assistance: 6: Modified independent (Device/Increase time) Extremity/Trunk Assessment RUE Assessment RUE Assessment: Within Functional Limits LUE Assessment LUE Assessment: Within Functional Limits  See FIM for current functional status  Willeen Cass [facility] 09/04/2013, 3:03 PM

## 2013-09-04 NOTE — Progress Notes (Signed)
Contacted by Brett CanalesSteve (Facilities managernurse manager) regarding patient having problems getting Rx filled. [facility] contacted and reports that medicaid does not have me listed as authorized prescriber and additionally lovenox will not be covered by them.  Changed lovenox to Xarelto 10 mg daily X 21 days. Increased ultram to 50-100 mg qid prn. Contacted patient regarding changes above--she's agreeable to this. Will try to get another provider to Rx oxycodone.

## 2013-09-04 NOTE — Progress Notes (Signed)
Pt discharged at 0935 accompanied by father. Observed pt administer lovenox shot appropriately. VSS pain controlled 3/10 after oxy IR at 0806. All belongings and equipment taken with pt. Mick SellShannon Jasleen Riepe RN

## 2013-09-05 ENCOUNTER — Other Ambulatory Visit: Payer: Self-pay | Admitting: Internal Medicine

## 2013-09-05 MED ORDER — OXYCODONE HCL 15 MG PO TABS: 15.0000 mg | ORAL_TABLET | Freq: Four times a day (QID) | ORAL | Status: DC | PRN

## 2013-09-06 ENCOUNTER — Encounter (HOSPITAL_COMMUNITY): Payer: Self-pay | Admitting: Orthopedic Surgery

## 2013-09-06 NOTE — Progress Notes (Signed)
Social Work  Discharge Note  The overall goal for the admission was met for:   Discharge location: Yes - home with mother and step-father able to provide 24/7 assist  Length of Stay: Yes - 4 days  Discharge activity level: Yes - modified independent  Home/community participation: Yes  Services provided included: MD, RD, PT, OT, SLP, RN, TR, Pharmacy, Neuropsych and SW  Financial Services: Medicaid  Follow-up services arranged: Outpatient: PT via [facility] ([place] DME: 20x18 lightweight w/c with ELRs, cushion, youth rolling walker, tub bench via [facility] and Patient/Family has no preference for HH/DME agencies  Comments (or additional information):  Patient/Family verbalized understanding of follow-up arrangements: Yes  Individual responsible for coordination of the follow-up plan: patient  Confirmed correct DME delivered: Emma Green 09/03/2013    Emma Green
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
## 2013-09-07 ENCOUNTER — Ambulatory Visit: Payer: No Typology Code available for payment source

## 2013-09-07 ENCOUNTER — Ambulatory Visit: Payer: No Typology Code available for payment source | Admitting: Physical Therapy

## 2013-09-07 ENCOUNTER — Telehealth: Payer: Self-pay

## 2013-09-07 NOTE — Telephone Encounter (Signed)
Patient has a question about PT and traveling.

## 2013-09-08 NOTE — Telephone Encounter (Signed)
Patient is having trouble getting out the house to therapy.  They have a truck and she has almost fallen getting in to it trying to go to PT.  She would like home health services.  Advised patient she would need to be seen to be reevaluated at some point.  Phone call was then disconnected by patient.  Please order home health PT if appropriate.

## 2013-09-08 NOTE — Telephone Encounter (Signed)
Home health PT would be fine. [facility]?, dx: left TIBIAL PLATEAU FX

## 2013-09-09 NOTE — Progress Notes (Signed)
Social Work Patient ID: Emma Green, female   DOB: 02/04/1986, 28 y.o.   MRN: [id number]  Have made referral for HHPT and HHRN (check incision/ staples site) today with [facility]. Have left VM for pt's mother regarding this referral and reminder that they need to set up follow up appointment with Dr. Eulah PontMurphy and Dr. Conchita ParisNundkumar if they have not already done so.  Mother has my cell # if she has any questions/ concerns.  Rebecca Cairns, LCSW

## 2013-09-09 NOTE — Telephone Encounter (Signed)
FYI----Rehab SW Amada JupiterLucy Hoyle is ordering HH PT. (I discussed with her this am).

## 2013-09-13 ENCOUNTER — Ambulatory Visit: Payer: No Typology Code available for payment source | Admitting: Physical Therapy

## 2013-09-14 ENCOUNTER — Telehealth: Payer: Self-pay

## 2013-09-14 NOTE — Telephone Encounter (Signed)
Patient states Oxycodone is not working for her anymore and would like something different.

## 2013-09-15 NOTE — Telephone Encounter (Signed)
Will need to see orthopedic surgeon for an evaluation to assess surgical state. This doctor can provide new medication or plan at that time based on findings.

## 2013-09-16 NOTE — Telephone Encounter (Signed)
Left message for patient to call office regarding her request for pain medication.  Per Dr Riley KillSwartz patient needs to see an orthopedic surgeon for eval.

## 2013-09-21 ENCOUNTER — Other Ambulatory Visit: Payer: Self-pay | Admitting: Physical Medicine & Rehabilitation

## 2013-09-24 DIAGNOSIS — R935 Abnormal findings on diagnostic imaging of other abdominal regions, including retroperitoneum: Secondary | ICD-10-CM

## 2013-09-24 DIAGNOSIS — F329 Major depressive disorder, single episode, unspecified: Secondary | ICD-10-CM

## 2013-09-24 DIAGNOSIS — S70929A Unspecified superficial injury of unspecified thigh, initial encounter: Secondary | ICD-10-CM

## 2013-09-24 DIAGNOSIS — S70919A Unspecified superficial injury of unspecified hip, initial encounter: Secondary | ICD-10-CM

## 2013-09-24 DIAGNOSIS — S80929A Unspecified superficial injury of unspecified lower leg, initial encounter: Secondary | ICD-10-CM

## 2013-09-24 DIAGNOSIS — Z7901 Long term (current) use of anticoagulants: Secondary | ICD-10-CM

## 2013-09-24 DIAGNOSIS — F3289 Other specified depressive episodes: Secondary | ICD-10-CM

## 2013-09-24 DIAGNOSIS — S90919A Unspecified superficial injury of unspecified ankle, initial encounter: Secondary | ICD-10-CM

## 2013-09-24 DIAGNOSIS — Z4889 Encounter for other specified surgical aftercare: Secondary | ICD-10-CM

## 2014-04-18 ENCOUNTER — Encounter (HOSPITAL_COMMUNITY): Payer: Self-pay | Admitting: Emergency Medicine

## 2014-04-18 ENCOUNTER — Emergency Department (HOSPITAL_COMMUNITY)
Admission: EM | Admit: 2014-04-18 | Discharge: 2014-04-18 | Disposition: A | Discharge status: Home / Self Care | Payer: Medicaid Other | Attending: Emergency Medicine | Admitting: Emergency Medicine

## 2014-04-18 DIAGNOSIS — L02419 Cutaneous abscess of limb, unspecified: Secondary | ICD-10-CM | POA: Insufficient documentation

## 2014-04-18 DIAGNOSIS — L03119 Cellulitis of unspecified part of limb: Principal | ICD-10-CM

## 2014-04-18 DIAGNOSIS — L03116 Cellulitis of left lower limb: Secondary | ICD-10-CM

## 2014-04-18 MED ORDER — HYDROCODONE-ACETAMINOPHEN 5-325 MG PO TABS: ORAL_TABLET | ORAL | Status: DC

## 2014-04-18 MED ORDER — CLINDAMYCIN HCL 300 MG PO CAPS: 300.0000 mg | ORAL_CAPSULE | Freq: Four times a day (QID) | ORAL | Status: DC

## 2014-04-18 MED ORDER — HYDROCODONE-ACETAMINOPHEN 5-325 MG PO TABS
2.0000 | ORAL_TABLET | Freq: Once | ORAL | Status: AC
  Administered 2014-04-18: 2 via ORAL
  Filled 2014-04-18: qty 2

## 2014-04-18 MED ORDER — MUPIROCIN 2 % EX OINT: TOPICAL_OINTMENT | CUTANEOUS | Status: DC

## 2014-04-18 NOTE — ED Notes (Signed)
I gave the patient a pair of large tan socks. 

## 2014-04-18 NOTE — Discharge Instructions (Signed)
Bacterial infection is a leading cause of boils, abscesses and skin infections.  While this can be the cause of serious infections, it is most often easily treated, prevented, and cured. ° °This bacteria,like other bacterial infections, is something you catch from somebody or something.  It can be transmitted from family, friends, casual acquaintances or even pets by close person-to-person contact or contact with an infected object.  Bacteria live on the skin and in the nasal passages.  It can invade into the skin through a break in the skin or sometimes it just invades by itself into a skin pore causing an area of redness, swelling and pain.  When this happens, it can cause a sticking sensation, so many people mistake it for an insect bite.   ° °If it causes a collection of pus (a boil or abscess) it should be drained.  Often packing or a drain will be left in place to allow the pus to drain for a few days.  This packing will need to be removed after 2 to 3 days.  If the wound is deep, it may need to be repacked.  If there is no collection of pus (cellulitis) incision and drainage is not immediately necessary, but antibiotics will be prescribed.  Sometimes cellulitis will turn into an abscess, so if symptoms persist, it's best to return for a recheck. ° °With bacterial infection, recurrence can be a problem.  This is because the bacteria can continue to live on the skin and in the nasal passages, presenting a risk for re-infection.  There are some steps that you can take to completely eradicate the infection and thus prevent future infections: ° °· Finish up your antibiotics completely.   °· Bacteria often take up residence in the nasal passages.  If they are not eliminated from this reservoir, the infection will return again and again.  Apply an antibiotic ointment, mupirocin, to both nostrils 3 times daily for a month.  °· Decontamination of the skin is also necessary. The current recommendation is Clorox baths  twice weekly for 3 months.  This can be done by diluting 4 oz of Clorox bleach in a tub of bathwater.  Then soak in this Clorox solution up to your neck for 15 minutes. When you get out of the tub, do not towel dry, allow the Clorox water to dry off your skin on its own.  °· Take infectious precautions:  Wash or sanitize hands frequently, spray tub or shower with Lysol after use, us towels or wash cloths only once, then launder, do not share towels or wash cloths with anyone else, do not share clothing with anyone else, launder clothing and bed clothes frequently. ° ° ° ° ° °

## 2014-04-18 NOTE — ED Notes (Signed)
Patient states she had an abscess come up on inner L thigh last week.  Patient states "It never came to a head, but now its like a cluster of boils there".  Patient complains of pain to the area.

## 2014-04-18 NOTE — ED Provider Notes (Signed)
  Chief Complaint    Chief Complaint  Patient presents with  . Abscess    History of Present Illness      Emma Green is a 28 year old female who has had a several year long history of recurring abscesses on her inner thighs. She has had a one-week history of a painful area on her left medial thigh with chills and sweats. There has been no purulent drainage. This is tender to touch. She's had no nausea or vomiting.  Review of Systems   Other than as noted above, the patient denies any of the following symptoms: Systemic:  No fever or chills. ENT:  No nasal congestion, rhinorrhea, sore throat, swelling of lips, tongue or throat. Resp:  No cough, wheezing, or shortness of breath.  PMFSH    Past medical history, family history, social history, meds, and allergies were reviewed.   Physical Exam     Vital signs:  BP 125/76  Pulse 93  Temp(Src) 98.6 F (37 C) (Oral)  Resp 20  SpO2 99%  LMP 04/18/2014 Gen:  Alert, oriented, in no distress. ENT:  Pharynx clear, no intraoral lesions, moist mucous membranes. Lungs:  Clear to auscultation. Skin:  There is a 5 x 5 cm area of induration and tenderness to palpation over the left medial thigh. There is no flatulence or purulent drainage.  Course in Urgent Care Center     The following meds were given:  Medications  HYDROcodone-acetaminophen (NORCO/VICODIN) 5-325 MG per tablet 2 tablet (2 tablets Oral Given 04/18/14 1004)   Assessment    The encounter diagnosis was Cellulitis of left lower extremity.  No evidence of abscess formation.  Plan     1.  Meds:  The following meds were prescribed:   Discharge Medication List as of 04/18/2014  9:59 AM     The patient was discharged on clindamycin 300 mg every 6 hours for 10 days and Norco 5/325 2 every 4 to 6 hours as needed for pain.  2.  Patient Education/Counseling:  The patient was given appropriate handouts, self care instructions, and instructed in symptomatic relief.   Suggested moist, warm compresses.  3.  Follow up:  The patient was told to follow up here if no better in 3 to 4 days, or sooner if becoming worse in any way, and given some red flag symptoms such as worsening rash, fever, or difficulty breathing which would prompt immediate return.  Follow up here if necessary.      Reuben Likes, MD 04/18/14 [phone]

## 2014-05-23 ENCOUNTER — Emergency Department: Payer: Self-pay | Admitting: Emergency Medicine

## 2014-06-27 ENCOUNTER — Encounter (HOSPITAL_COMMUNITY): Payer: Self-pay | Admitting: Emergency Medicine

## 2014-08-31 ENCOUNTER — Emergency Department (HOSPITAL_COMMUNITY): Payer: Medicaid Other

## 2014-08-31 ENCOUNTER — Emergency Department (HOSPITAL_COMMUNITY)
Admission: EM | Admit: 2014-08-31 | Discharge: 2014-08-31 | Disposition: A | Discharge status: Home / Self Care | Payer: Medicaid Other | Attending: Emergency Medicine | Admitting: Emergency Medicine

## 2014-08-31 ENCOUNTER — Encounter (HOSPITAL_COMMUNITY): Payer: Self-pay | Admitting: *Deleted

## 2014-08-31 DIAGNOSIS — J069 Acute upper respiratory infection, unspecified: Secondary | ICD-10-CM

## 2014-08-31 DIAGNOSIS — N830 Follicular cyst of ovary: Secondary | ICD-10-CM | POA: Diagnosis not present

## 2014-08-31 DIAGNOSIS — Z8701 Personal history of pneumonia (recurrent): Secondary | ICD-10-CM | POA: Insufficient documentation

## 2014-08-31 DIAGNOSIS — Z8619 Personal history of other infectious and parasitic diseases: Secondary | ICD-10-CM | POA: Insufficient documentation

## 2014-08-31 DIAGNOSIS — Z8639 Personal history of other endocrine, nutritional and metabolic disease: Secondary | ICD-10-CM | POA: Diagnosis not present

## 2014-08-31 DIAGNOSIS — Z720 Tobacco use: Secondary | ICD-10-CM | POA: Insufficient documentation

## 2014-08-31 DIAGNOSIS — F329 Major depressive disorder, single episode, unspecified: Secondary | ICD-10-CM | POA: Diagnosis not present

## 2014-08-31 DIAGNOSIS — Z8679 Personal history of other diseases of the circulatory system: Secondary | ICD-10-CM | POA: Diagnosis not present

## 2014-08-31 DIAGNOSIS — F419 Anxiety disorder, unspecified: Secondary | ICD-10-CM | POA: Insufficient documentation

## 2014-08-31 DIAGNOSIS — R1030 Lower abdominal pain, unspecified: Secondary | ICD-10-CM | POA: Insufficient documentation

## 2014-08-31 DIAGNOSIS — N83201 Unspecified ovarian cyst, right side: Secondary | ICD-10-CM

## 2014-08-31 DIAGNOSIS — R109 Unspecified abdominal pain: Secondary | ICD-10-CM

## 2014-08-31 DIAGNOSIS — Z3202 Encounter for pregnancy test, result negative: Secondary | ICD-10-CM | POA: Insufficient documentation

## 2014-08-31 DIAGNOSIS — R079 Chest pain, unspecified: Secondary | ICD-10-CM | POA: Diagnosis not present

## 2014-08-31 DIAGNOSIS — Z7901 Long term (current) use of anticoagulants: Secondary | ICD-10-CM | POA: Insufficient documentation

## 2014-08-31 LAB — CBC WITH DIFFERENTIAL/PLATELET
BASOS ABS: 0 10*3/uL (ref 0.0–0.1)
Basophils Relative: 0 % (ref 0–1)
EOS ABS: 0.2 10*3/uL (ref 0.0–0.7)
Eosinophils Relative: 2 % (ref 0–5)
HEMATOCRIT: 38.1 % (ref 36.0–46.0)
Hemoglobin: 12.4 g/dL (ref 12.0–15.0)
LYMPHS ABS: 2.5 10*3/uL (ref 0.7–4.0)
LYMPHS PCT: 32 % (ref 12–46)
MCH: 27.3 pg (ref 26.0–34.0)
MCHC: 32.5 g/dL (ref 30.0–36.0)
MCV: 83.9 fL (ref 78.0–100.0)
MONO ABS: 0.6 10*3/uL (ref 0.1–1.0)
Monocytes Relative: 8 % (ref 3–12)
Neutro Abs: 4.6 10*3/uL (ref 1.7–7.7)
Neutrophils Relative %: 58 % (ref 43–77)
PLATELETS: 300 10*3/uL (ref 150–400)
RBC: 4.54 MIL/uL (ref 3.87–5.11)
RDW: 14.8 % (ref 11.5–15.5)
WBC: 7.9 10*3/uL (ref 4.0–10.5)

## 2014-08-31 LAB — URINALYSIS, ROUTINE W REFLEX MICROSCOPIC
BILIRUBIN URINE: NEGATIVE
Glucose, UA: NEGATIVE mg/dL
Hgb urine dipstick: NEGATIVE
KETONES UR: NEGATIVE mg/dL
Nitrite: NEGATIVE
PH: 6 (ref 5.0–8.0)
Protein, ur: NEGATIVE mg/dL
Specific Gravity, Urine: 1.025 (ref 1.005–1.030)
UROBILINOGEN UA: 0.2 mg/dL (ref 0.0–1.0)

## 2014-08-31 LAB — COMPREHENSIVE METABOLIC PANEL
ALBUMIN: 3.9 g/dL (ref 3.5–5.2)
ALK PHOS: 80 U/L (ref 39–117)
ALT: 19 U/L (ref 0–35)
ANION GAP: 7 (ref 5–15)
AST: 24 U/L (ref 0–37)
BUN: 7 mg/dL (ref 6–23)
CALCIUM: 9 mg/dL (ref 8.4–10.5)
CO2: 25 mmol/L (ref 19–32)
Chloride: 104 mEq/L (ref 96–112)
Creatinine, Ser: 0.82 mg/dL (ref 0.50–1.10)
GLUCOSE: 96 mg/dL (ref 70–99)
Potassium: 4.1 mmol/L (ref 3.5–5.1)
Sodium: 136 mmol/L (ref 135–145)
TOTAL PROTEIN: 7.9 g/dL (ref 6.0–8.3)
Total Bilirubin: 0.8 mg/dL (ref 0.3–1.2)

## 2014-08-31 LAB — URINE MICROSCOPIC-ADD ON

## 2014-08-31 LAB — PREGNANCY, URINE: Preg Test, Ur: NEGATIVE

## 2014-08-31 LAB — LIPASE, BLOOD: LIPASE: 24 U/L (ref 11–59)

## 2014-08-31 MED ORDER — IOHEXOL 300 MG/ML  SOLN: 25.0000 mL | Freq: Once | INTRAMUSCULAR | Status: DC | PRN

## 2014-08-31 MED ORDER — SODIUM CHLORIDE 0.9 % IV BOLUS (SEPSIS)
250.0000 mL | Freq: Once | INTRAVENOUS | Status: AC
  Administered 2014-08-31: 250 mL via INTRAVENOUS

## 2014-08-31 MED ORDER — NAPROXEN 500 MG PO TABS: 500.0000 mg | ORAL_TABLET | Freq: Two times a day (BID) | ORAL | Status: DC

## 2014-08-31 MED ORDER — DM-GUAIFENESIN ER 30-600 MG PO TB12: 1.0000 | ORAL_TABLET | Freq: Two times a day (BID) | ORAL | Status: DC

## 2014-08-31 MED ORDER — IOHEXOL 300 MG/ML  SOLN
100.0000 mL | Freq: Once | INTRAMUSCULAR | Status: AC | PRN
  Administered 2014-08-31: 100 mL via INTRAVENOUS

## 2014-08-31 MED ORDER — ONDANSETRON HCL 4 MG/2ML IJ SOLN
4.0000 mg | Freq: Once | INTRAMUSCULAR | Status: AC
  Administered 2014-08-31: 4 mg via INTRAVENOUS
  Filled 2014-08-31: qty 2

## 2014-08-31 MED ORDER — SODIUM CHLORIDE 0.9 % IV SOLN: INTRAVENOUS | Status: DC

## 2014-08-31 MED ORDER — SODIUM CHLORIDE 0.9 % IV SOLN
INTRAVENOUS | Status: DC
  Administered 2014-08-31: 09:00:00 via INTRAVENOUS

## 2014-08-31 NOTE — ED Provider Notes (Signed)
CSN: [id number]     Arrival date & time 08/31/14  0804 History   First MD Initiated Contact with Patient 08/31/14 [phone]     Chief Complaint  Patient presents with  . Abdominal Pain     (Consider location/radiation/quality/duration/timing/severity/associated sxs/prior Treatment) Patient is a 29 y.o. female presenting with abdominal pain. The history is provided by the patient.  Abdominal Pain Associated symptoms: nausea   Associated symptoms: no chest pain, no dysuria, no fever, no shortness of breath, no vaginal bleeding and no vaginal discharge    patient with 2 week history of lower abdominal pain. Described as an ache. 5 out of 10. Associated with upper respiratory infection for the past 3 weeks still having a lot of mucus. Abdominal pain radiates to the back. Not associated with fever however has had night sweats no dysuria some nausea no vomiting no diarrhea. Last menstrual period is unknown. Patient was some chest tightness but no real chest pain. This seems to be mostly associated with the cough.  Past Medical History  Diagnosis Date  . Pyelonephritis   . Chlamydia   . Cervical cyst   . High cholesterol   . Pneumonia 2011  . Exertional shortness of breath   . JXBJYNWG(956.2)     "weekly" (07/08/2013)  . Migraine     "monthly" (07/08/2013)  . Depression   . Anxiety    Past Surgical History  Procedure Laterality Date  . Cesarean section  2008  . Tibia im nail insertion Left 08/26/2013    Procedure: INTRAMEDULLARY (IM) NAIL TIBIAL;  Surgeon: Sheral Apley, MD;  Location: [facility] OR;  Service: Orthopedics;  Laterality: Left;   History reviewed. No pertinent family history. History  Substance Use Topics  . Smoking status: Current Every Day Smoker -- 0.25 packs/day for 9 years    Types: Cigarettes  . Smokeless tobacco: Never Used  . Alcohol Use: 2.4 oz/week    4 Cans of beer per week   OB History    Gravida Para Term Preterm AB TAB SAB Ectopic Multiple Living   Review of Systems  Constitutional: Positive for diaphoresis. Negative for fever.  HENT: Positive for congestion.   Respiratory: Positive for chest tightness. Negative for shortness of breath.   Cardiovascular: Negative for chest pain.  Gastrointestinal: Positive for nausea and abdominal pain.  Genitourinary: Negative for dysuria, vaginal bleeding and vaginal discharge.  Musculoskeletal: Positive for back pain.  Neurological: Negative for headaches.  Hematological: Does not bruise/bleed easily.  Psychiatric/Behavioral: Negative for decreased concentration.      Allergies  Review of patient's allergies indicates no known allergies.  Home Medications   Prior to Admission medications   Medication Sig Start Date End Date Taking? Authorizing Provider  Aspirin-Salicylamide-Caffeine (BC HEADACHE POWDER PO) Take 1 packet by mouth daily as needed (for headache).   Yes Historical Provider, MD  ibuprofen (ADVIL,MOTRIN) 400 MG tablet Take 400 mg by mouth every 6 (six) hours as needed for mild pain.   Yes Historical Provider, MD  ALPRAZolam (XANAX) 0.25 MG tablet Take 1 tablet (0.25 mg total) by mouth 3 (three) times daily as needed for anxiety. Patient not taking: Reported on 08/31/2014 09/03/13   Evlyn Kanner Love, PA-C  clindamycin (CLEOCIN) 300 MG capsule Take 1 capsule (300 mg total) by mouth 4 (four) times daily. Patient not taking: Reported on 08/31/2014 04/18/14   Reuben Likes, MD  dextromethorphan-guaiFENesin [facility] DM) 30-600  MG per 12 hr tablet Take 1 tablet by mouth 2 (two) times daily. 08/31/14   Vanetta MuldersScott Genesi Stefanko, MD  enoxaparin (LOVENOX) 30 MG/0.3ML injection Inject 0.3 mLs (30 mg total) into the skin every 12 (twelve) hours. Patient not taking: Reported on 08/31/2014 09/03/13   Evlyn KannerPamela S Love, PA-C  ferrous sulfate 325 (65 FE) MG tablet Take 1 tablet (325 mg total) by mouth 3 (three) times daily with meals. For anemia Patient not taking: Reported on 08/31/2014 09/03/13   Evlyn KannerPamela S Love, PA-C   HYDROcodone-acetaminophen (NORCO/VICODIN) 5-325 MG per tablet 1 to 2 tabs every 4 to 6 hours as needed for pain. Patient not taking: Reported on 08/31/2014 04/18/14   Reuben Likesavid C Keller, MD  methocarbamol (ROBAXIN) 500 MG tablet Take 1-2 tablets (500-1,000 mg total) by mouth every 6 (six) hours as needed for muscle spasms. Patient not taking: Reported on 08/31/2014 09/03/13   Evlyn KannerPamela S Love, PA-C  mupirocin ointment (BACTROBAN) 2 % Apply to both nostrils TID fp 1 month Patient not taking: Reported on 08/31/2014 04/18/14   Reuben Likesavid C Keller, MD  naproxen (NAPROSYN) 500 MG tablet Take 1 tablet (500 mg total) by mouth 2 (two) times daily. 08/31/14   Vanetta MuldersScott Toma Erichsen, MD  oxyCODONE (ROXICODONE) 15 MG immediate release tablet Take 1 tablet (15 mg total) by mouth every 6 (six) hours as needed. Patient not taking: Reported on 08/31/2014 09/05/13   Gordy SaversPeter F Kwiatkowski, MD  oxyCODONE (ROXICODONE) 15 MG immediate release tablet Take 1 tablet (15 mg total) by mouth every 6 (six) hours as needed for pain. Patient not taking: Reported on 08/31/2014 09/05/13   Aleksei Plotnikov V, MD  senna-docusate (SENOKOT-S) 8.6-50 MG per tablet Take 2 tablets by mouth at bedtime. laxative Patient not taking: Reported on 08/31/2014 09/03/13   Evlyn KannerPamela S Love, PA-C  traMADol (ULTRAM) 50 MG tablet Take 1 tablet (50 mg total) by mouth every 6 (six) hours as needed for moderate pain. Patient not taking: Reported on 08/31/2014 09/03/13   Evlyn KannerPamela S Love, PA-C   BP 116/65 mmHg  Pulse 72  Temp(Src) 98.9 F (37.2 C) (Oral)  Resp 18  SpO2 97%  LMP  (LMP Unknown) Physical Exam  Constitutional: She is oriented to person, place, and time. She appears well-developed and well-nourished. No distress.  HENT:  Head: Normocephalic and atraumatic.  Mouth/Throat: Oropharynx is clear and moist.  Eyes: Conjunctivae are normal. Pupils are equal, round, and reactive to light.  Neck: Normal range of motion. Neck supple.  Cardiovascular: Normal rate and normal heart sounds.    Pulmonary/Chest: Effort normal and breath sounds normal. No respiratory distress.  Abdominal: Soft. Bowel sounds are normal. There is no tenderness.  Musculoskeletal: Normal range of motion.  Neurological: She is alert and oriented to person, place, and time. No cranial nerve deficit. She exhibits normal muscle tone. Coordination normal.  Skin: Skin is warm. No rash noted.  Nursing note and vitals reviewed.   ED Course  Procedures (including critical care time) Labs Review Labs Reviewed  URINALYSIS, ROUTINE W REFLEX MICROSCOPIC - Abnormal; Notable for the following:    APPearance CLOUDY (*)    Leukocytes, UA SMALL (*)    All other components within normal limits  URINE MICROSCOPIC-ADD ON - Abnormal; Notable for the following:    Squamous Epithelial / LPF MANY (*)    Bacteria, UA MANY (*)    All other components within normal limits  URINE CULTURE  COMPREHENSIVE METABOLIC PANEL  CBC WITH DIFFERENTIAL  PREGNANCY, URINE  LIPASE,  BLOOD    Imaging Review Dg Chest 2 View  08/31/2014   CLINICAL DATA:  Chest and abdominal pain for several weeks.  EXAM: CHEST  2 VIEW  COMPARISON:  08/29/2013  FINDINGS: Previously seen atelectasis in the right lung base is no longer visualized. Low lung volumes are noted, however both lungs appear clear. No evidence of pleural effusion. Heart size and mediastinal contours are within normal limits.  IMPRESSION: Resolution of right basilar atelectasis since prior exam. No acute findings.   Electronically Signed   By: Myles Rosenthal M.D.   On: 08/31/2014 10:27   Ct Abdomen Pelvis W Contrast  08/31/2014   CLINICAL DATA:  Lower abdomen pain, nausea and vomiting for 3 weeks  EXAM: CT ABDOMEN AND PELVIS WITH CONTRAST  TECHNIQUE: Multidetector CT imaging of the abdomen and pelvis was performed using the standard protocol following bolus administration of intravenous contrast.  CONTRAST:  OMNIPAQUE IOHEXOL 300 MG/ML  SOLN  COMPARISON:  CT abdomen pelvis of 08/26/2013   FINDINGS: The lung bases are clear. The liver enhances with no focal abnormality and no ductal dilatation is seen. No calcified gallstones are noted. The pancreas is normal in size and the pancreatic duct is not dilated. A small low-attenuation structure in the tail of the pancreas is unchanged and probably is fatty and of doubtful significance. The adrenal glands and spleen are unremarkable. The stomach is moderately distended with oral contrast media and is unremarkable. The kidneys enhance with no calculus or mass and no hydronephrosis is seen. The abdominal aorta is normal in caliber. Only a few small retroperitoneal lymph nodes are present which are stable.  The urinary bladder is not well distended but no abnormality is seen. The uterus is normal in size. There is a slightly complex anteriorly positioned left ovarian structure present of 2.5 cm with attenuation 36 HU, probably a slightly complex cyst. If further assessment is warranted ultrasound of the pelvis would be recommended. Only a tiny amount of free fluid is present in the pelvis. The colon is largely decompressed. No diverticulitis is seen. The appendix and terminal ileum are unremarkable. No bony abnormality is seen. The lumbar vertebrae are in normal alignment with normal disc spaces.  IMPRESSION: 1. No explanation for the patient's pain is seen. The appendix and terminal ileum are unremarkable. 2. Slightly complex ovarian structure in the anterior left pelvis of 2.5 cm probably represents a slightly complex ovarian cyst. Consider pelvic ultrasound if further assessment is warranted. Minimal amount of free fluid in the pelvis.   Electronically Signed   By: Dwyane Dee M.D.   On: 08/31/2014 10:03     EKG Interpretation None      MDM   Final diagnoses:  Abdominal pain  Chest pain  Cyst of right ovary  URI (upper respiratory infection)    Patient's abdominal pain lower abdomen perhaps may be secondary to the ovarian cyst. Brings he  test is negative. Patient denies any vaginal discharge denies any pelvic pain. Patient also upper respiratory infection. Still having significant amounts of mucus. Some cough. Chest x-ray negative for pneumonia. Urinalysis appears to be contaminated do not think it's consistent with urinary tract infection. However culture sent if positive patient will be contacted. Will treat patient with the Naprosyn for the ovarian cyst and Mucinex DM for the upper respiratory symptoms. Chest x-rays negative for pneumonia.    Vanetta Mulders, MD 08/31/14 1157

## 2014-08-31 NOTE — ED Notes (Addendum)
Pt in c/o lower abd pain and lower back pain for the last two weeks, denies vaginal discharge or urinary symptoms, reports intermittent nausea, no distress noted, unknown LMP

## 2014-08-31 NOTE — Discharge Instructions (Signed)
Workup for abdominal pain without any significant findings. CT scan was negative except for the finding of an ovarian cyst. Take the Naprosyn as directed for that. For the upper respiratory infection symptoms and the mucus take Mucinex. Chest x-ray was negative for pneumonia. Pregnancy test was negative. Urinalysis is seemed to be contaminated urine culture sent if consistent with a urinary tract infection you will be contacted started on antibiotics.

## 2014-08-31 NOTE — ED Notes (Signed)
Pt transported to xray 

## 2014-09-01 ENCOUNTER — Encounter: Payer: Self-pay | Admitting: Physical Therapy

## 2014-09-01 LAB — URINE CULTURE: Colony Count: >=100000

## 2014-10-03 ENCOUNTER — Emergency Department: Payer: Self-pay | Admitting: Emergency Medicine

## 2014-10-05 ENCOUNTER — Emergency Department: Payer: Self-pay | Admitting: Emergency Medicine

## 2014-10-20 ENCOUNTER — Emergency Department: Payer: Self-pay | Admitting: Emergency Medicine

## 2014-12-06 ENCOUNTER — Emergency Department: Admit: 2014-12-06 | Disposition: A | Discharge status: Home / Self Care | Payer: Self-pay | Admitting: Student

## 2014-12-06 LAB — CBC WITH DIFFERENTIAL/PLATELET
BASOS ABS: 0.1 10*3/uL (ref 0.0–0.1)
Basophil %: 1.1 %
Eosinophil #: 0.2 10*3/uL (ref 0.0–0.7)
Eosinophil %: 2.4 %
HCT: 36.3 % (ref 35.0–47.0)
HGB: 11.6 g/dL — ABNORMAL LOW (ref 12.0–16.0)
LYMPHS ABS: 2.2 10*3/uL (ref 1.0–3.6)
Lymphocyte %: 34.7 %
MCH: 27.1 pg (ref 26.0–34.0)
MCHC: 31.9 g/dL — ABNORMAL LOW (ref 32.0–36.0)
MCV: 85 fL (ref 80–100)
MONO ABS: 0.5 x10 3/mm (ref 0.2–0.9)
Monocyte %: 7.6 %
NEUTROS ABS: 3.4 10*3/uL (ref 1.4–6.5)
Neutrophil %: 54.2 %
PLATELETS: 292 10*3/uL (ref 150–440)
RBC: 4.27 10*6/uL (ref 3.80–5.20)
RDW: 15.2 % — AB (ref 11.5–14.5)
WBC: 6.3 10*3/uL (ref 3.6–11.0)

## 2014-12-06 LAB — COMPREHENSIVE METABOLIC PANEL
ALT: 22 U/L
Albumin: 4 g/dL
Alkaline Phosphatase: 72 U/L
Anion Gap: 5 — ABNORMAL LOW (ref 7–16)
BILIRUBIN TOTAL: 0.6 mg/dL
BUN: 13 mg/dL
CHLORIDE: 106 mmol/L
CREATININE: 0.79 mg/dL
Calcium, Total: 8.9 mg/dL
Co2: 27 mmol/L
EGFR (African American): >60
Glucose: 98 mg/dL
POTASSIUM: 3.8 mmol/L
SGOT(AST): 23 U/L
Sodium: 138 mmol/L
Total Protein: 7.7 g/dL

## 2014-12-06 LAB — URINALYSIS, COMPLETE
BLOOD: NEGATIVE
Bilirubin,UR: NEGATIVE
GLUCOSE, UR: NEGATIVE mg/dL (ref 0–75)
Ketone: NEGATIVE
Nitrite: NEGATIVE
Ph: 6 (ref 4.5–8.0)
Protein: NEGATIVE
SPECIFIC GRAVITY: 1.026 (ref 1.003–1.030)

## 2014-12-06 LAB — LIPASE, BLOOD: Lipase: 26 U/L

## 2014-12-20 ENCOUNTER — Emergency Department (HOSPITAL_COMMUNITY): Payer: Medicaid Other

## 2014-12-20 ENCOUNTER — Emergency Department (HOSPITAL_COMMUNITY)
Admission: EM | Admit: 2014-12-20 | Discharge: 2014-12-20 | Disposition: A | Discharge status: Home / Self Care | Payer: Medicaid Other | Attending: Emergency Medicine | Admitting: Emergency Medicine

## 2014-12-20 ENCOUNTER — Encounter (HOSPITAL_COMMUNITY): Payer: Self-pay | Admitting: Emergency Medicine

## 2014-12-20 DIAGNOSIS — Z720 Tobacco use: Secondary | ICD-10-CM | POA: Diagnosis not present

## 2014-12-20 DIAGNOSIS — Z8619 Personal history of other infectious and parasitic diseases: Secondary | ICD-10-CM | POA: Insufficient documentation

## 2014-12-20 DIAGNOSIS — Z8639 Personal history of other endocrine, nutritional and metabolic disease: Secondary | ICD-10-CM | POA: Diagnosis not present

## 2014-12-20 DIAGNOSIS — S46011A Strain of muscle(s) and tendon(s) of the rotator cuff of right shoulder, initial encounter: Secondary | ICD-10-CM | POA: Insufficient documentation

## 2014-12-20 DIAGNOSIS — Z8659 Personal history of other mental and behavioral disorders: Secondary | ICD-10-CM | POA: Diagnosis not present

## 2014-12-20 DIAGNOSIS — Z87448 Personal history of other diseases of urinary system: Secondary | ICD-10-CM | POA: Insufficient documentation

## 2014-12-20 DIAGNOSIS — X58XXXA Exposure to other specified factors, initial encounter: Secondary | ICD-10-CM | POA: Insufficient documentation

## 2014-12-20 DIAGNOSIS — Y939 Activity, unspecified: Secondary | ICD-10-CM | POA: Insufficient documentation

## 2014-12-20 DIAGNOSIS — S46811A Strain of other muscles, fascia and tendons at shoulder and upper arm level, right arm, initial encounter: Secondary | ICD-10-CM

## 2014-12-20 DIAGNOSIS — Z8742 Personal history of other diseases of the female genital tract: Secondary | ICD-10-CM | POA: Diagnosis not present

## 2014-12-20 DIAGNOSIS — Y999 Unspecified external cause status: Secondary | ICD-10-CM | POA: Diagnosis not present

## 2014-12-20 DIAGNOSIS — Y929 Unspecified place or not applicable: Secondary | ICD-10-CM | POA: Insufficient documentation

## 2014-12-20 DIAGNOSIS — Z8701 Personal history of pneumonia (recurrent): Secondary | ICD-10-CM | POA: Diagnosis not present

## 2014-12-20 DIAGNOSIS — M542 Cervicalgia: Secondary | ICD-10-CM | POA: Diagnosis present

## 2014-12-20 DIAGNOSIS — Z8679 Personal history of other diseases of the circulatory system: Secondary | ICD-10-CM | POA: Insufficient documentation

## 2014-12-20 DIAGNOSIS — Z791 Long term (current) use of non-steroidal anti-inflammatories (NSAID): Secondary | ICD-10-CM | POA: Insufficient documentation

## 2014-12-20 MED ORDER — TRAMADOL HCL 50 MG PO TABS
50.0000 mg | ORAL_TABLET | Freq: Once | ORAL | Status: AC
  Administered 2014-12-20: 50 mg via ORAL
  Filled 2014-12-20: qty 1

## 2014-12-20 MED ORDER — LIDOCAINE HCL (PF) 1 % IJ SOLN
10.0000 mL | Freq: Once | INTRAMUSCULAR | Status: AC
  Administered 2014-12-20: 10 mL via INTRADERMAL
  Filled 2014-12-20: qty 10

## 2014-12-20 MED ORDER — MELOXICAM 15 MG PO TABS
15.0000 mg | ORAL_TABLET | Freq: Every day | ORAL | Status: DC
Start: 2014-12-20 — End: 2015-02-16

## 2014-12-20 MED ORDER — CYCLOBENZAPRINE HCL 10 MG PO TABS: 10.0000 mg | ORAL_TABLET | Freq: Two times a day (BID) | ORAL | Status: DC | PRN

## 2014-12-20 MED ORDER — CYCLOBENZAPRINE HCL 10 MG PO TABS
10.0000 mg | ORAL_TABLET | Freq: Once | ORAL | Status: AC
  Administered 2014-12-20: 10 mg via ORAL
  Filled 2014-12-20: qty 1
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
## 2014-12-20 NOTE — Discharge Instructions (Signed)
Take mobic as needed for pain. Take Flexeril as needed for muscle spasm. Refer to attached documents for more information. Follow up with your doctor for further evaluation.

## 2015-02-16 ENCOUNTER — Encounter: Payer: Self-pay | Admitting: Emergency Medicine

## 2015-02-16 ENCOUNTER — Emergency Department
Admission: EM | Admit: 2015-02-16 | Discharge: 2015-02-16 | Disposition: A | Discharge status: Home / Self Care | Payer: Medicaid Other | Attending: Emergency Medicine | Admitting: Emergency Medicine

## 2015-02-16 DIAGNOSIS — Z79899 Other long term (current) drug therapy: Secondary | ICD-10-CM | POA: Diagnosis not present

## 2015-02-16 DIAGNOSIS — Z720 Tobacco use: Secondary | ICD-10-CM | POA: Diagnosis not present

## 2015-02-16 DIAGNOSIS — Z3202 Encounter for pregnancy test, result negative: Secondary | ICD-10-CM | POA: Insufficient documentation

## 2015-02-16 DIAGNOSIS — R1030 Lower abdominal pain, unspecified: Secondary | ICD-10-CM | POA: Diagnosis present

## 2015-02-16 DIAGNOSIS — N390 Urinary tract infection, site not specified: Secondary | ICD-10-CM | POA: Diagnosis not present

## 2015-02-16 LAB — URINALYSIS COMPLETE WITH MICROSCOPIC (ARMC ONLY)
Bilirubin Urine: NEGATIVE
Glucose, UA: NEGATIVE mg/dL
HGB URINE DIPSTICK: NEGATIVE
Ketones, ur: NEGATIVE mg/dL
NITRITE: NEGATIVE
PH: 5 (ref 5.0–8.0)
Protein, ur: 30 mg/dL — AB
Specific Gravity, Urine: 1.032 — ABNORMAL HIGH (ref 1.005–1.030)

## 2015-02-16 LAB — COMPREHENSIVE METABOLIC PANEL
ALBUMIN: 4.4 g/dL (ref 3.5–5.0)
ALT: 21 U/L (ref 14–54)
AST: 25 U/L (ref 15–41)
Alkaline Phosphatase: 75 U/L (ref 38–126)
Anion gap: 6 (ref 5–15)
BUN: 10 mg/dL (ref 6–20)
CALCIUM: 9.2 mg/dL (ref 8.9–10.3)
CO2: 26 mmol/L (ref 22–32)
Chloride: 105 mmol/L (ref 101–111)
Creatinine, Ser: 0.88 mg/dL (ref 0.44–1.00)
GFR calc Af Amer: >60 mL/min (ref >60)
GLUCOSE: 140 mg/dL — AB (ref 65–99)
POTASSIUM: 3.9 mmol/L (ref 3.5–5.1)
Sodium: 137 mmol/L (ref 135–145)
TOTAL PROTEIN: 8.8 g/dL — AB (ref 6.5–8.1)
Total Bilirubin: 0.4 mg/dL (ref 0.3–1.2)

## 2015-02-16 LAB — POCT PREGNANCY, URINE: Preg Test, Ur: NEGATIVE

## 2015-02-16 LAB — CBC WITH DIFFERENTIAL/PLATELET
BASOS ABS: 0.1 10*3/uL (ref 0–0.1)
BASOS PCT: 1 %
EOS ABS: 0.2 10*3/uL (ref 0–0.7)
EOS PCT: 2 %
HCT: 41.7 % (ref 35.0–47.0)
HEMOGLOBIN: 13.6 g/dL (ref 12.0–16.0)
Lymphocytes Relative: 37 %
Lymphs Abs: 3.5 10*3/uL (ref 1.0–3.6)
MCH: 28.2 pg (ref 26.0–34.0)
MCHC: 32.6 g/dL (ref 32.0–36.0)
MCV: 86.3 fL (ref 80.0–100.0)
MONO ABS: 0.6 10*3/uL (ref 0.2–0.9)
MONOS PCT: 6 %
NEUTROS ABS: 5.2 10*3/uL (ref 1.4–6.5)
Neutrophils Relative %: 54 %
Platelets: 334 10*3/uL (ref 150–440)
RBC: 4.83 MIL/uL (ref 3.80–5.20)
RDW: 14.9 % — AB (ref 11.5–14.5)
WBC: 9.6 10*3/uL (ref 3.6–11.0)

## 2015-02-16 MED ORDER — NITROFURANTOIN MONOHYD MACRO 100 MG PO CAPS: 100.0000 mg | ORAL_CAPSULE | Freq: Two times a day (BID) | ORAL | Status: AC

## 2015-02-16 NOTE — Discharge Instructions (Signed)
Please seek medical attention for any high fevers, chest pain, shortness of breath, change in behavior, persistent vomiting, bloody stool or any other new or concerning symptoms. ° °Urinary Tract Infection °Urinary tract infections (UTIs) can develop anywhere along your urinary tract. Your urinary tract is your body's drainage system for removing wastes and extra water. Your urinary tract includes two kidneys, two ureters, a bladder, and a urethra. Your kidneys are a pair of bean-shaped organs. Each kidney is about the size of your fist. They are located below your ribs, one on each side of your spine. °CAUSES °Infections are caused by microbes, which are microscopic organisms, including fungi, viruses, and bacteria. These organisms are so small that they can only be seen through a microscope. Bacteria are the microbes that most commonly cause UTIs. °SYMPTOMS  °Symptoms of UTIs may vary by age and gender of the patient and by the location of the infection. Symptoms in young women typically include a frequent and intense urge to urinate and a painful, burning feeling in the bladder or urethra during urination. Older women and men are more likely to be tired, shaky, and weak and have muscle aches and abdominal pain. A fever may mean the infection is in your kidneys. Other symptoms of a kidney infection include pain in your back or sides below the ribs, nausea, and vomiting. °DIAGNOSIS °To diagnose a UTI, your caregiver will ask you about your symptoms. Your caregiver also will ask to provide a urine sample. The urine sample will be tested for bacteria and white blood cells. White blood cells are made by your body to help fight infection. °TREATMENT  °Typically, UTIs can be treated with medication. Because most UTIs are caused by a bacterial infection, they usually can be treated with the use of antibiotics. The choice of antibiotic and length of treatment depend on your symptoms and the type of bacteria causing your  infection. °HOME CARE INSTRUCTIONS °· If you were prescribed antibiotics, take them exactly as your caregiver instructs you. Finish the medication even if you feel better after you have only taken some of the medication. °· Drink enough water and fluids to keep your urine clear or pale yellow. °· Avoid caffeine, tea, and carbonated beverages. They tend to irritate your bladder. °· Empty your bladder often. Avoid holding urine for long periods of time. °· Empty your bladder before and after sexual intercourse. °· After a bowel movement, women should cleanse from front to back. Use each tissue only once. °SEEK MEDICAL CARE IF:  °· You have back pain. °· You develop a fever. °· Your symptoms do not begin to resolve within 3 days. °SEEK IMMEDIATE MEDICAL CARE IF:  °· You have severe back pain or lower abdominal pain. °· You develop chills. °· You have nausea or vomiting. °· You have continued burning or discomfort with urination. °MAKE SURE YOU:  °· Understand these instructions. °· Will watch your condition. °· Will get help right away if you are not doing well or get worse. °Document Released: 05/22/2005 Document Revised: 02/11/2012 Document Reviewed: 09/20/2011 °[employer]® Patient Information ©2015 [employer]. This information is not intended to replace advice given to you by your health care provider. Make sure you discuss any questions you have with your health care provider. ° °
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
## 2015-02-16 NOTE — ED Notes (Signed)
C/o lower abd. Cramping x 1 week, states she has not had a MP in 4 months, states she has taken a pregnancy test but it was negative

## 2015-04-09 ENCOUNTER — Emergency Department (HOSPITAL_COMMUNITY)
Admission: EM | Admit: 2015-04-09 | Discharge: 2015-04-09 | Disposition: A | Discharge status: Home / Self Care | Payer: Medicaid Other | Attending: Emergency Medicine | Admitting: Emergency Medicine

## 2015-04-09 ENCOUNTER — Encounter (HOSPITAL_COMMUNITY): Payer: Self-pay | Admitting: *Deleted

## 2015-04-09 ENCOUNTER — Emergency Department (HOSPITAL_COMMUNITY): Payer: Medicaid Other

## 2015-04-09 DIAGNOSIS — Z8619 Personal history of other infectious and parasitic diseases: Secondary | ICD-10-CM | POA: Insufficient documentation

## 2015-04-09 DIAGNOSIS — E669 Obesity, unspecified: Secondary | ICD-10-CM | POA: Insufficient documentation

## 2015-04-09 DIAGNOSIS — Z87448 Personal history of other diseases of urinary system: Secondary | ICD-10-CM | POA: Insufficient documentation

## 2015-04-09 DIAGNOSIS — S0083XA Contusion of other part of head, initial encounter: Secondary | ICD-10-CM

## 2015-04-09 DIAGNOSIS — S0990XA Unspecified injury of head, initial encounter: Secondary | ICD-10-CM | POA: Diagnosis present

## 2015-04-09 DIAGNOSIS — Y92009 Unspecified place in unspecified non-institutional (private) residence as the place of occurrence of the external cause: Secondary | ICD-10-CM | POA: Insufficient documentation

## 2015-04-09 DIAGNOSIS — F10120 Alcohol abuse with intoxication, uncomplicated: Secondary | ICD-10-CM | POA: Diagnosis not present

## 2015-04-09 DIAGNOSIS — Y939 Activity, unspecified: Secondary | ICD-10-CM | POA: Diagnosis not present

## 2015-04-09 DIAGNOSIS — S20212A Contusion of left front wall of thorax, initial encounter: Secondary | ICD-10-CM

## 2015-04-09 DIAGNOSIS — S01512A Laceration without foreign body of oral cavity, initial encounter: Secondary | ICD-10-CM

## 2015-04-09 DIAGNOSIS — Z720 Tobacco use: Secondary | ICD-10-CM | POA: Insufficient documentation

## 2015-04-09 DIAGNOSIS — Z8701 Personal history of pneumonia (recurrent): Secondary | ICD-10-CM | POA: Diagnosis not present

## 2015-04-09 DIAGNOSIS — Z8742 Personal history of other diseases of the female genital tract: Secondary | ICD-10-CM | POA: Diagnosis not present

## 2015-04-09 DIAGNOSIS — Y999 Unspecified external cause status: Secondary | ICD-10-CM | POA: Insufficient documentation

## 2015-04-09 DIAGNOSIS — S0992XA Unspecified injury of nose, initial encounter: Secondary | ICD-10-CM | POA: Insufficient documentation

## 2015-04-09 DIAGNOSIS — F10920 Alcohol use, unspecified with intoxication, uncomplicated: Secondary | ICD-10-CM

## 2015-04-09 DIAGNOSIS — W2209XA Striking against other stationary object, initial encounter: Secondary | ICD-10-CM | POA: Diagnosis not present

## 2015-04-09 DIAGNOSIS — W19XXXA Unspecified fall, initial encounter: Secondary | ICD-10-CM

## 2015-04-09 MED ORDER — CYCLOBENZAPRINE HCL 10 MG PO TABS: 10.0000 mg | ORAL_TABLET | Freq: Two times a day (BID) | ORAL | Status: DC | PRN

## 2015-04-09 MED ORDER — IBUPROFEN 800 MG PO TABS: 800.0000 mg | ORAL_TABLET | Freq: Three times a day (TID) | ORAL | Status: DC

## 2015-04-09 MED ORDER — KETOROLAC TROMETHAMINE 60 MG/2ML IM SOLN
60.0000 mg | Freq: Once | INTRAMUSCULAR | Status: AC
  Administered 2015-04-09: 60 mg via INTRAMUSCULAR
  Filled 2015-04-09: qty 2

## 2015-04-09 NOTE — ED Notes (Signed)
Patient transported to X-ray 

## 2015-04-09 NOTE — Discharge Instructions (Signed)
Blunt Chest Trauma Blunt chest trauma is an injury caused by a blow to the chest. These chest injuries can be very painful. Blunt chest trauma often results in bruised or broken (fractured) ribs. Most cases of bruised and fractured ribs from blunt chest traumas get better after 1 to 3 weeks of rest and pain medicine. Often, the soft tissue in the chest wall is also injured, causing pain and bruising. Internal organs, such as the heart and lungs, may also be injured. Blunt chest trauma can lead to serious medical problems. This injury requires immediate medical care. CAUSES   Motor vehicle collisions.  Falls.  Physical violence.  Sports injuries. SYMPTOMS   Chest pain. The pain may be worse when you move or breathe deeply.  Shortness of breath.  Lightheadedness.  Bruising.  Tenderness.  Swelling. DIAGNOSIS  Your caregiver will do a physical exam. X-rays may be taken to look for fractures. However, minor rib fractures may not show up on X-rays until a few days after the injury. If a more serious injury is suspected, further imaging tests may be done. This may include ultrasounds, computed tomography (CT) scans, or magnetic resonance imaging (MRI). TREATMENT  Treatment depends on the severity of your injury. Your caregiver may prescribe pain medicines and deep breathing exercises. HOME CARE INSTRUCTIONS  Limit your activities until you can move around without much pain.  Do not do any strenuous work until your injury is healed.  Put ice on the injured area.  Put ice in a plastic bag.  Place a towel between your skin and the bag.  Leave the ice on for 15-20 minutes, 03-04 times a day.  You may wear a rib belt as directed by your caregiver to reduce pain.  Practice deep breathing as directed by your caregiver to keep your lungs clear.  Only take over-the-counter or prescription medicines for pain, fever, or discomfort as directed by your caregiver. SEEK IMMEDIATE MEDICAL  CARE IF:   You have increasing pain or shortness of breath.  You cough up blood.  You have nausea, vomiting, or abdominal pain.  You have a fever.  You feel dizzy, weak, or you faint. MAKE SURE YOU:  Understand these instructions.  Will watch your condition.  Will get help right away if you are not doing well or get worse. Document Released: 09/19/2004 Document Revised: 11/04/2011 Document Reviewed: 05/29/2011 [facility] Patient Information 2015 [place], [place]. This information is not intended to replace advice given to you by your health care provider. Make sure you discuss any questions you have with your health care provider. Mouth Laceration A mouth laceration is a cut inside the mouth. TREATMENT  Because of all the bacteria in the mouth, lacerations are usually not stitched (sutured) unless the wound is gaping open. Sometimes, a couple sutures may be placed just to hold the edges of the wound together and to speed healing. Over the next 1 to 2 days, you will see that the wound edges appear gray in color. The edges may appear ragged and slightly spread apart. Because of all the normal bacteria in the mouth, these wounds are contaminated, but this is not an infection that needs antibiotics. Most wounds heal with no problems despite their appearance. HOME CARE INSTRUCTIONS   Rinse your mouth with a warm, saltwater wash 4 to 6 times per day, or as your caregiver instructs.  Continue oral hygiene and gentle tooth brushing as normal, if possible.  Do not eat or drink hot food or beverages  while your mouth is still numb.  Eat a bland diet to avoid irritation from acidic foods.  Only take over-the-counter or prescription medicines for pain, discomfort, or fever as directed by your caregiver.  Follow up with your caregiver as instructed. You may need to see your caregiver for a wound check in 48 to 72 hours to make sure your wound is healing.  If your laceration was sutured, do not  play with the sutures or knots with your tongue. If you do this, they will gradually loosen and may become untied. You may need a tetanus shot if:  You cannot remember when you had your last tetanus shot.  You have never had a tetanus shot. If you get a tetanus shot, your arm may swell, get red, and feel warm to the touch. This is common and not a problem. If you need a tetanus shot and you choose not to have one, there is a rare chance of getting tetanus. Sickness from tetanus can be serious. SEEK MEDICAL CARE IF:   You develop swelling or increasing pain in the wound or in other parts of your face.  You have a fever.  You develop swollen, tender glands in the throat.  You notice the wound edges do not stay together after your sutures have been removed.  You see pus coming from the wound. Some drainage in the mouth is normal. MAKE SURE YOU:   Understand these instructions.  Will watch your condition.  Will get help right away if you are not doing well or get worse. Document Released: 08/12/2005 Document Revised: 11/04/2011 Document Reviewed: 02/14/2011 [facility] Patient Information 2015 [place], [place]. This information is not intended to replace advice given to you by your health care provider. Make sure you discuss any questions you have with your health care provider. Facial or Scalp Contusion A facial or scalp contusion is a deep bruise on the face or head. Injuries to the face and head generally cause a lot of swelling, especially around the eyes. Contusions are the result of an injury that caused bleeding under the skin. The contusion may turn blue, purple, or yellow. Minor injuries will give you a painless contusion, but more severe contusions may stay painful and swollen for a few weeks.  CAUSES  A facial or scalp contusion is caused by a blunt injury or trauma to the face or head area.  SIGNS AND SYMPTOMS   Swelling of the injured area.   Discoloration of the injured  area.   Tenderness, soreness, or pain in the injured area.  DIAGNOSIS  The diagnosis can be made by taking a medical history and doing a physical exam. An X-ray exam, CT scan, or MRI may be needed to determine if there are any associated injuries, such as broken bones (fractures). TREATMENT  Often, the best treatment for a facial or scalp contusion is applying cold compresses to the injured area. Over-the-counter medicines may also be recommended for pain control.  HOME CARE INSTRUCTIONS   Only take over-the-counter or prescription medicines as directed by your health care provider.   Apply ice to the injured area.   Put ice in a plastic bag.   Place a towel between your skin and the bag.   Leave the ice on for 20 minutes, 2-3 times a day.  SEEK MEDICAL CARE IF:  You have bite problems.   You have pain with chewing.   You are concerned about facial defects. SEEK IMMEDIATE MEDICAL CARE IF:  You  have severe pain or a headache that is not relieved by medicine.   You have unusual sleepiness, confusion, or personality changes.   You throw up (vomit).   You have a persistent nosebleed.   You have double vision or blurred vision.   You have fluid drainage from your nose or ear.   You have difficulty walking or using your arms or legs.  MAKE SURE YOU:   Understand these instructions.  Will watch your condition.  Will get help right away if you are not doing well or get worse. Document Released: 09/19/2004 Document Revised: 06/02/2013 Document Reviewed: 03/25/2013 [facility] Patient Information 2015 [place], [place]. This information is not intended to replace advice given to you by your health care provider. Make sure you discuss any questions you have with your health care provider.  Alcohol Intoxication Alcohol intoxication occurs when the amount of alcohol that a person has consumed impairs his or her ability to mentally and physically function. Alcohol  directly impairs the normal chemical activity of the brain. Drinking large amounts of alcohol can lead to changes in mental function and behavior, and it can cause many physical effects that can be harmful.  Alcohol intoxication can range in severity from mild to very severe. Various factors can affect the level of intoxication that occurs, such as the person's age, gender, weight, frequency of alcohol consumption, and the presence of other medical conditions (such as diabetes, seizures, or heart conditions). Dangerous levels of alcohol intoxication may occur when people drink large amounts of alcohol in a short period (binge drinking). Alcohol can also be especially dangerous when combined with certain prescription medicines or "recreational" drugs. SIGNS AND SYMPTOMS Some common signs and symptoms of mild alcohol intoxication include:  Loss of coordination.  Changes in mood and behavior.  Impaired judgment.  Slurred speech. As alcohol intoxication progresses to more severe levels, other signs and symptoms will appear. These may include:  Vomiting.  Confusion and impaired memory.  Slowed breathing.  Seizures.  Loss of consciousness. DIAGNOSIS  Your health care provider will take a medical history and perform a physical exam. You will be asked about the amount and type of alcohol you have consumed. Blood tests will be done to measure the concentration of alcohol in your blood. In many places, your blood alcohol level must be lower than 80 mg/dL (1.61%) to legally drive. However, many dangerous effects of alcohol can occur at much lower levels.  TREATMENT  People with alcohol intoxication often do not require treatment. Most of the effects of alcohol intoxication are temporary, and they go away as the alcohol naturally leaves the body. Your health care provider will monitor your condition until you are stable enough to go home. Fluids are sometimes given through an IV access tube to help  prevent dehydration.  HOME CARE INSTRUCTIONS  Do not drive after drinking alcohol.  Stay hydrated. Drink enough water and fluids to keep your urine clear or pale yellow. Avoid caffeine.   Only take over-the-counter or prescription medicines as directed by your health care provider.  SEEK MEDICAL CARE IF:   You have persistent vomiting.   You do not feel better after a few days.  You have frequent alcohol intoxication. Your health care provider can help determine if you should see a substance use treatment counselor. SEEK IMMEDIATE MEDICAL CARE IF:   You become shaky or tremble when you try to stop drinking.   You shake uncontrollably (seizure).   You throw up (vomit) blood.  This may be bright red or may look like black coffee grounds.   You have blood in your stool. This may be bright red or may appear as a black, tarry, bad smelling stool.   You become lightheaded or faint.  MAKE SURE YOU:   Understand these instructions.  Will watch your condition.  Will get help right away if you are not doing well or get worse. Document Released: 05/22/2005 Document Revised: 04/14/2013 Document Reviewed: 01/15/2013 [facility] Patient Information 2015 [place], [place]. This information is not intended to replace advice given to you by your health care provider. Make sure you discuss any questions you have with your health care provider.

## 2015-04-09 NOTE — ED Provider Notes (Signed)
CSN: [id number]     Arrival date & time 04/09/15  1127 History   First MD Initiated Contact with Patient 04/09/15 1229     Chief Complaint  Patient presents with  . Fall  . Chest Pain  . Dental Pain  . Facial Pain     (Consider location/radiation/quality/duration/timing/severity/associated sxs/prior Treatment) HPI Patient was intoxicated last night. She was at her home and she fell at approximately 1 or 2 in the morning. She fell striking her left chest on the corner of an interior wall. Also she hit her chin mouth. She was not knocked out by the blow. She reports she has had a lot of pain in her left-sided chest since then. Pain is worse with movements and deep breaths. Patient reports however she does not feel short of breath but it has increased pain with inspiration. She reports of bridge of her nose hurts and her chin and mouth hurt. She has not had nasal bleeding. The patient had a pre-existing large chip to her right upper incisor. No other noted new dental injury. Past Medical History  Diagnosis Date  . Pyelonephritis   . Chlamydia   . Cervical cyst   . High cholesterol   . Pneumonia 2011  . Exertional shortness of breath   . VFIEPPIR(518.8)     "weekly" (07/08/2013)  . Migraine     "monthly" (07/08/2013)  . Depression   . Anxiety    Past Surgical History  Procedure Laterality Date  . Cesarean section  2008  . Tibia im nail insertion Left 08/26/2013    Procedure: INTRAMEDULLARY (IM) NAIL TIBIAL;  Surgeon: Sheral Apley, MD;  Location: [facility] OR;  Service: Orthopedics;  Laterality: Left;   No family history on file. Social History  Substance Use Topics  . Smoking status: Current Every Day Smoker -- 0.25 packs/day for 9 years    Types: Cigarettes  . Smokeless tobacco: Never Used  . Alcohol Use: 2.4 oz/week    4 Cans of beer per week   OB History    Gravida Para Term Preterm AB TAB SAB Ectopic Multiple Living   1 1 1       1      Review of Systems 10 Systems  reviewed and are negative for acute change except as noted in the HPI.    Allergies  Tramadol  Home Medications   Prior to Admission medications   Medication Sig Start Date End Date Taking? Authorizing Provider  cyclobenzaprine (FLEXERIL) 10 MG tablet Take 1 tablet (10 mg total) by mouth 2 (two) times daily as needed for muscle spasms. 04/09/15   Arby Barrette, MD  ibuprofen (ADVIL,MOTRIN) 800 MG tablet Take 1 tablet (800 mg total) by mouth 3 (three) times daily. 04/09/15   Arby Barrette, MD   BP 116/83 mmHg  Pulse 83  Temp(Src) 98 F (36.7 C) (Oral)  Resp 20  Wt 215 lb (97.523 kg)  SpO2 99% Physical Exam  Constitutional: She is oriented to person, place, and time. She appears well-developed and well-nourished.  Patient is obese. She is alert and nontoxic. She is slightly tearful and holding an ice pack to her chin. She has no respiratory distress. Her color is good.  HENT:  Patient's face is symmetric without area significant ecchymosis or swelling. She has mild swelling over the nasal bridge but no ecchymosis. She endorses tenderness to palpation of the nasal bridge. The patient has normal range of motion at the temporomandibular joint. She endorses significant pain to palpation  over the mentum. There is a small abrasion and contusion at the mentum and below the lower lip. The abrasion is very superficial in appearance. The anterior lower lip has a small interior laceration that is not through and through and limited to approximately 4 mm. There is mild-to-moderate swelling of the lower lip. The dentition is stable without tenderness palpation. Patient has a pre-existing first upper right incisor fracture that is obliquely oriented. The nares are patent without septal hematoma or any blood in the nares. The steer oropharynx is patent without any evidence of nasopharyngeal blood drainage. Soft tissues and neck are otherwise supple.  Eyes: Conjunctivae and EOM are normal. Pupils are equal,  round, and reactive to light.  Neck: Neck supple.  Cardiovascular: Normal rate, regular rhythm, normal heart sounds and intact distal pulses.   Pulmonary/Chest: Effort normal and breath sounds normal. She exhibits tenderness.  Patient endorses severe tenderness to palpation on the left chest wall at approximately ribs 5 through 7 starting at the posterior axillary line and moving anteriorly to below the breast. I cannot appreciate crepitus in this area. There is no obvious contusion or abrasion. The breast tissue is soft without contusion or pain to palpation.  Abdominal: Soft. Bowel sounds are normal. She exhibits no distension. There is no tenderness.  Musculoskeletal: Normal range of motion. She exhibits no edema or tenderness.  Neurological: She is alert and oriented to person, place, and time. She has normal strength. No cranial nerve deficit. She exhibits normal muscle tone. Coordination normal. GCS eye subscore is 4. GCS verbal subscore is 5. GCS motor subscore is 6.  Skin: Skin is warm, dry and intact.  Psychiatric: She has a normal mood and affect.     ED Course  Procedures (including critical care time) Labs Review Labs Reviewed - No data to display  Imaging Review Dg Orthopantogram  04/09/2015   CLINICAL DATA:  Recent fall onto chin  EXAM: ORTHOPANTOGRAM/PANORAMIC  COMPARISON:  None.  FINDINGS: No acute fracture or dislocation is noted. Chest right central incisor is noted in the maxilla but by history chronic in nature. Lucency is noted adjacent to the root of the left maxillary second bicuspid. Periapical abscess could not be totally excluded. No other focal abnormality is noted.  IMPRESSION: No acute fracture is noted.  Possible periapical abscess on the left as described.   Electronically Signed   By: Alcide Clever M.D.   On: 04/09/2015 15:25   Dg Ribs Unilateral W/chest Left  04/09/2015   CLINICAL DATA:  Acute left-sided rib pain after fall. Initial encounter.  EXAM: LEFT RIBS  AND CHEST - 3+ VIEW  COMPARISON:  August 31, 2014.  FINDINGS: No fracture or other bone lesions are seen involving the ribs. There is no evidence of pneumothorax or pleural effusion. Both lungs are clear. Heart size and mediastinal contours are within normal limits.  IMPRESSION: Normal left ribs.  No acute cardiopulmonary abnormality seen.   Electronically Signed   By: Lupita Raider, M.D.   On: 04/09/2015 12:47   I, Arby Barrette, personally reviewed and evaluated these images and lab results as part of my medical decision-making.   EKG Interpretation None      MDM   Final diagnoses:  Fall, initial encounter  Alcohol intoxication, uncomplicated  Chin contusion, initial encounter  Laceration of intraoral region without complication, initial encounter  Chest wall contusion, left, initial encounter   X-ray did not show any acute fracture. Clinically the patient is well in appearance.  She does have pain in focal contused areas. Instruction are provided for return symptoms. We treated with ibuprofen and Flexeril.    Arby Barrette, MD 04/09/15 [phone]

## 2015-04-09 NOTE — ED Notes (Signed)
The pt just returned from xray.  Alert familky at the bedside.  Pt holding an ice pack to her rt face

## 2015-04-09 NOTE — ED Notes (Addendum)
Patient admits to drinking etoh last night.  She fell and woke approx 30 min ago.  She has left sided rib pain and laceration to the inside of her mouth,   She is also complaining of nose pain.   She is tearful due to pain. States she has more pain with deep breathing   She was at home alone when this happened. Patient was brought to ED by her mother.  Patient denies any other injuries

## 2015-04-18 ENCOUNTER — Emergency Department (HOSPITAL_COMMUNITY): Payer: Medicaid Other

## 2015-04-18 ENCOUNTER — Encounter (HOSPITAL_COMMUNITY): Payer: Self-pay | Admitting: Emergency Medicine

## 2015-04-18 DIAGNOSIS — Z8659 Personal history of other mental and behavioral disorders: Secondary | ICD-10-CM | POA: Diagnosis not present

## 2015-04-18 DIAGNOSIS — R079 Chest pain, unspecified: Secondary | ICD-10-CM | POA: Diagnosis present

## 2015-04-18 DIAGNOSIS — Z8639 Personal history of other endocrine, nutritional and metabolic disease: Secondary | ICD-10-CM | POA: Diagnosis not present

## 2015-04-18 DIAGNOSIS — Z8619 Personal history of other infectious and parasitic diseases: Secondary | ICD-10-CM | POA: Diagnosis not present

## 2015-04-18 DIAGNOSIS — Z8614 Personal history of Methicillin resistant Staphylococcus aureus infection: Secondary | ICD-10-CM | POA: Insufficient documentation

## 2015-04-18 DIAGNOSIS — R200 Anesthesia of skin: Secondary | ICD-10-CM | POA: Insufficient documentation

## 2015-04-18 DIAGNOSIS — Z8701 Personal history of pneumonia (recurrent): Secondary | ICD-10-CM | POA: Insufficient documentation

## 2015-04-18 DIAGNOSIS — Z87448 Personal history of other diseases of urinary system: Secondary | ICD-10-CM | POA: Diagnosis not present

## 2015-04-18 DIAGNOSIS — M546 Pain in thoracic spine: Secondary | ICD-10-CM | POA: Diagnosis not present

## 2015-04-18 DIAGNOSIS — G43909 Migraine, unspecified, not intractable, without status migrainosus: Secondary | ICD-10-CM | POA: Insufficient documentation

## 2015-04-18 DIAGNOSIS — Z720 Tobacco use: Secondary | ICD-10-CM | POA: Diagnosis not present

## 2015-04-18 DIAGNOSIS — R0789 Other chest pain: Secondary | ICD-10-CM | POA: Insufficient documentation

## 2015-04-18 DIAGNOSIS — M25512 Pain in left shoulder: Secondary | ICD-10-CM | POA: Insufficient documentation

## 2015-04-18 LAB — CBC
HCT: 35.5 % — ABNORMAL LOW (ref 36.0–46.0)
Hemoglobin: 11.8 g/dL — ABNORMAL LOW (ref 12.0–15.0)
MCH: 28.4 pg (ref 26.0–34.0)
MCHC: 33.2 g/dL (ref 30.0–36.0)
MCV: 85.5 fL (ref 78.0–100.0)
PLATELETS: 310 10*3/uL (ref 150–400)
RBC: 4.15 MIL/uL (ref 3.87–5.11)
RDW: 13.8 % (ref 11.5–15.5)
WBC: 5.8 10*3/uL (ref 4.0–10.5)

## 2015-04-18 LAB — BASIC METABOLIC PANEL
Anion gap: 7 (ref 5–15)
BUN: 12 mg/dL (ref 6–20)
CALCIUM: 8.8 mg/dL — AB (ref 8.9–10.3)
CO2: 26 mmol/L (ref 22–32)
CREATININE: 0.8 mg/dL (ref 0.44–1.00)
Chloride: 103 mmol/L (ref 101–111)
GFR calc Af Amer: >60 mL/min (ref >60)
GFR calc non Af Amer: >60 mL/min (ref >60)
Glucose, Bld: 92 mg/dL (ref 65–99)
Potassium: 3.8 mmol/L (ref 3.5–5.1)
SODIUM: 136 mmol/L (ref 135–145)

## 2015-04-18 LAB — I-STAT TROPONIN, ED: TROPONIN I, POC: 0 ng/mL (ref 0.00–0.08)

## 2015-04-18 NOTE — ED Notes (Signed)
Pt sts she woke up this am with tingling all down L side then she began to have chest heaviness. Pt also reports pain to L rib cage when she takes a deep breath- sts she bruised rib 1 week ago. Stroke scale negative.

## 2015-04-19 ENCOUNTER — Encounter (HOSPITAL_COMMUNITY): Payer: Self-pay | Admitting: Emergency Medicine

## 2015-04-19 ENCOUNTER — Emergency Department (HOSPITAL_COMMUNITY): Payer: Medicaid Other

## 2015-04-19 ENCOUNTER — Emergency Department (HOSPITAL_COMMUNITY)
Admission: EM | Admit: 2015-04-19 | Discharge: 2015-04-19 | Disposition: A | Discharge status: Home / Self Care | Payer: Medicaid Other | Source: Home / Self Care | Attending: Emergency Medicine | Admitting: Emergency Medicine

## 2015-04-19 ENCOUNTER — Emergency Department (HOSPITAL_COMMUNITY)
Admission: EM | Admit: 2015-04-19 | Discharge: 2015-04-19 | Disposition: A | Discharge status: Home / Self Care | Payer: Medicaid Other | Attending: Emergency Medicine | Admitting: Emergency Medicine

## 2015-04-19 DIAGNOSIS — T148XXA Other injury of unspecified body region, initial encounter: Secondary | ICD-10-CM

## 2015-04-19 DIAGNOSIS — R0789 Other chest pain: Secondary | ICD-10-CM

## 2015-04-19 DIAGNOSIS — R200 Anesthesia of skin: Secondary | ICD-10-CM

## 2015-04-19 HISTORY — DX: Encounter for other specified aftercare: Z51.89

## 2015-04-19 HISTORY — DX: Carrier or suspected carrier of methicillin resistant Staphylococcus aureus: Z22.322

## 2015-04-19 MED ORDER — IBUPROFEN 600 MG PO TABS: 600.0000 mg | ORAL_TABLET | Freq: Four times a day (QID) | ORAL | Status: DC | PRN

## 2015-04-19 MED ORDER — METHOCARBAMOL 500 MG PO TABS: 1000.0000 mg | ORAL_TABLET | Freq: Three times a day (TID) | ORAL | Status: DC | PRN

## 2015-04-19 MED ORDER — IBUPROFEN 200 MG PO TABS
600.0000 mg | ORAL_TABLET | Freq: Once | ORAL | Status: AC
  Administered 2015-04-19: 600 mg via ORAL
  Filled 2015-04-19: qty 3

## 2015-04-19 MED ORDER — METHOCARBAMOL 500 MG PO TABS
1000.0000 mg | ORAL_TABLET | Freq: Once | ORAL | Status: AC
  Administered 2015-04-19: 1000 mg via ORAL
  Filled 2015-04-19: qty 2

## 2015-04-19 NOTE — ED Notes (Signed)
Patient is resting comfortably. 

## 2015-04-19 NOTE — ED Notes (Signed)
Pt stated that when she got here her wait time was 2 1/2 hrs I explained to pt that the current wait time was 5 hrs because we have had EMS traffic and critical pts in the back. She stated that the wait time was ridiculous and I apologized to Pt but she decided to leave

## 2015-04-19 NOTE — Discharge Instructions (Signed)
Call and make an appointment to follow-up with the neurologist. Return immediately for worsening numbness, changes in your vision, weakness, loss of bowel or bladder function or for any concerns.   Chest Wall Pain Chest wall pain is pain in or around the bones and muscles of your chest. It may take up to 6 weeks to get better. It may take longer if you must stay physically active in your work and activities.  CAUSES  Chest wall pain may happen on its own. However, it may be caused by:  A viral illness like the flu.  Injury.  Coughing.  Exercise.  Arthritis.  Fibromyalgia.  Shingles. HOME CARE INSTRUCTIONS   Avoid overtiring physical activity. Try not to strain or perform activities that cause pain. This includes any activities using your chest or your abdominal and side muscles, especially if heavy weights are used.  Put ice on the sore area.  Put ice in a plastic bag.  Place a towel between your skin and the bag.  Leave the ice on for 15-20 minutes per hour while awake for the first 2 days.  Only take over-the-counter or prescription medicines for pain, discomfort, or fever as directed by your caregiver. SEEK IMMEDIATE MEDICAL CARE IF:   Your pain increases, or you are very uncomfortable.  You have a fever.  Your chest pain becomes worse.  You have new, unexplained symptoms.  You have nausea or vomiting.  You feel sweaty or lightheaded.  You have a cough with phlegm (sputum), or you cough up blood. MAKE SURE YOU:   Understand these instructions.  Will watch your condition.  Will get help right away if you are not doing well or get worse. Document Released: 08/12/2005 Document Revised: 11/04/2011 Document Reviewed: 04/08/2011 [facility] Patient Information 2015 [place], [place]. This information is not intended to replace advice given to you by your health care provider. Make sure you discuss any questions you have with your health care provider.  Muscle  Strain A muscle strain is an injury that occurs when a muscle is stretched beyond its normal length. Usually a small number of muscle fibers are torn when this happens. Muscle strain is rated in degrees. First-degree strains have the least amount of muscle fiber tearing and pain. Second-degree and third-degree strains have increasingly more tearing and pain.  Usually, recovery from muscle strain takes 1-2 weeks. Complete healing takes 5-6 weeks.  CAUSES  Muscle strain happens when a sudden, violent force placed on a muscle stretches it too far. This may occur with lifting, sports, or a fall.  RISK FACTORS Muscle strain is especially common in athletes.  SIGNS AND SYMPTOMS At the site of the muscle strain, there may be:  Pain.  Bruising.  Swelling.  Difficulty using the muscle due to pain or lack of normal function. DIAGNOSIS  Your health care provider will perform a physical exam and ask about your medical history. TREATMENT  Often, the best treatment for a muscle strain is resting, icing, and applying cold compresses to the injured area.  HOME CARE INSTRUCTIONS   Use the PRICE method of treatment to promote muscle healing during the first 2-3 days after your injury. The PRICE method involves:  Protecting the muscle from being injured again.  Restricting your activity and resting the injured body part.  Icing your injury. To do this, put ice in a plastic bag. Place a towel between your skin and the bag. Then, apply the ice and leave it on from 15-20 minutes each  hour. After the third day, switch to moist heat packs.  Apply compression to the injured area with a splint or elastic bandage. Be careful not to wrap it too tightly. This may interfere with blood circulation or increase swelling.  Elevate the injured body part above the level of your heart as often as you can.  Only take over-the-counter or prescription medicines for pain, discomfort, or fever as directed by your health  care provider.  Warming up prior to exercise helps to prevent future muscle strains. SEEK MEDICAL CARE IF:   You have increasing pain or swelling in the injured area.  You have numbness, tingling, or a significant loss of strength in the injured area. MAKE SURE YOU:   Understand these instructions.  Will watch your condition.  Will get help right away if you are not doing well or get worse. Document Released: 08/12/2005 Document Revised: 06/02/2013 Document Reviewed: 03/11/2013 [facility] Patient Information 2015 [place], [place]. This information is not intended to replace advice given to you by your health care provider. Make sure you discuss any questions you have with your health care provider.

## 2015-04-19 NOTE — ED Provider Notes (Signed)
CSN: [id number]   Arrival date & time 04/19/15 0041  History  This chart was scribed for [name], MD by Bethel Born, ED Scribe. This patient was seen in room WA15/WA15 and the patient's care was started at 1:08 AM.  Chief Complaint  Patient presents with  . Numbness  . Chest Pain    HPI The history is provided by the patient. No language interpreter was used.   Emma Green is a 29 y.o. female with PMHx of anxiety and pneumonia who presents to the Emergency Department complaining of constant central and left chest pain with gradual onset around 11 AM yesterday. The pain is described as heavy, worse with movement and palpation and rated 8/10 in severity. Associated symptoms include neck pain, thoracic back pain diffuse and constant left-sided numbness ("like I sat in a tub of cold water") upon waking yesterday and intermittent sharp shooting left sided pain. Pt denies recent fall (other than a fall 1 week ago that she was worked up for in the ED), nausea, vomiting, LE pain, new LE swelling (notes chronic left foot swelling due to a past injury and repair) abdominal pain, and incontinence of bowel or bladder. No frequent NSAID use. She was in the waiting room at [facility] last night where she had labs, an EKG, and a CXR but left to come to [facility] after not being seen by a physician for several hours.   Past Medical History  Diagnosis Date  . Pyelonephritis   . Chlamydia   . Cervical cyst   . High cholesterol   . Pneumonia 2011  . Exertional shortness of breath   . WUJWJXBJ(478.2)     "weekly" (07/08/2013)  . Migraine     "monthly" (07/08/2013)  . Depression   . Anxiety   . Blood transfusion without reported diagnosis   . MRSA (methicillin resistant staph aureus) culture positive     Past Surgical History  Procedure Laterality Date  . Cesarean section  2008  . Tibia im nail insertion Left 08/26/2013    Procedure: INTRAMEDULLARY (IM) NAIL TIBIAL;  Surgeon:  Sheral Apley, MD;  Location: [facility] OR;  Service: Orthopedics;  Laterality: Left;    Family History  Problem Relation Age of Onset  . Diabetes Other   . Hypertension Other   . Cancer Other     Social History  Substance Use Topics  . Smoking status: Current Every Day Smoker -- 0.25 packs/day for 9 years    Types: Cigarettes  . Smokeless tobacco: Never Used  . Alcohol Use: 2.4 oz/week    4 Cans of beer per week     Review of Systems  Constitutional: Negative for fever and chills.  Eyes: Negative for visual disturbance.  Respiratory: Negative for shortness of breath.   Cardiovascular: Positive for chest pain.  Gastrointestinal: Negative for nausea, vomiting, abdominal pain and diarrhea.  Musculoskeletal: Positive for myalgias and back pain. Negative for neck pain and neck stiffness.  Skin: Negative for rash and wound.  Neurological: Positive for numbness. Negative for dizziness, weakness, light-headedness and headaches.  All other systems reviewed and are negative.   Home Medications   Prior to Admission medications   Medication Sig Start Date End Date Taking? Authorizing Provider  meloxicam (MOBIC) 15 MG tablet Take 15 mg by mouth daily.   Yes Historical Provider, MD  cyclobenzaprine (FLEXERIL) 10 MG tablet Take 1 tablet (10 mg total) by mouth 2 (two) times daily as needed for muscle spasms. Patient not  taking: Reported on 04/19/2015 04/09/15   Arby Barrette, MD  ibuprofen (ADVIL,MOTRIN) 600 MG tablet Take 1 tablet (600 mg total) by mouth every 6 (six) hours as needed. 04/19/15   [name], MD  ibuprofen (ADVIL,MOTRIN) 800 MG tablet Take 1 tablet (800 mg total) by mouth 3 (three) times daily. Patient not taking: Reported on 04/19/2015 04/09/15   Arby Barrette, MD  methocarbamol (ROBAXIN) 500 MG tablet Take 2 tablets (1,000 mg total) by mouth every 8 (eight) hours as needed for muscle spasms. 04/19/15   [name], MD    Allergies  Tramadol  Triage Vitals: BP 124/74  mmHg  Pulse 79  Temp(Src) 98.2 F (36.8 C) (Oral)  Resp 20  Ht  (1.575 m)  Wt 235 lb (106.595 kg)  BMI 42.97 kg/m2  SpO2 99%  LMP 03/10/2015  Physical Exam  Constitutional: She is oriented to person, place, and time. She appears well-developed and well-nourished. No distress.  HENT:  Head: Normocephalic and atraumatic.  Mouth/Throat: Oropharynx is clear and moist.  Eyes: EOM are normal. Pupils are equal, round, and reactive to light.  Neck: Normal range of motion. Neck supple.  Patient with no posterior midline cervical tenderness to palpation. There is no evidence of meningismus. Patient does have left trapezius tenderness and spasm appreciated with palpation  Cardiovascular: Normal rate and regular rhythm.   Pulmonary/Chest: Effort normal and breath sounds normal. No respiratory distress. She has no wheezes. She has no rales. She exhibits tenderness (chest tenderness is completely reproduced with palpation over the left pectoralis muscle. There is no crepitance or deformity.).  Abdominal: Soft. Bowel sounds are normal. She exhibits no distension and no mass. There is no tenderness. There is no rebound and no guarding.  Musculoskeletal: Normal range of motion. She exhibits tenderness. She exhibits no edema.  Patient with left sided thoracic back pain over the rhomboids. No midline or bony tenderness. No calf swelling or tenderness. Distal pulses intact.  Neurological: She is alert and oriented to person, place, and time.  Patient is alert and oriented x3 with clear, goal oriented speech. Patient has 5/5 motor in all extremities. Patient has decreased subjective sensation on the left side compared to the right to light touch. She states she is still able to feel this is different than the right side of her body.  Bilateral finger-to-nose is normal with no signs of dysmetria. Patient has a normal gait and walks without assistance.  Skin: Skin is warm and dry. No rash noted. No erythema.   Psychiatric: She has a normal mood and affect. Her behavior is normal.  Nursing note and vitals reviewed.   ED Course  Procedures   DIAGNOSTIC STUDIES: Oxygen Saturation is 99% on RA, normal by my interpretation.    COORDINATION OF CARE: 1:15 AM Discussed treatment plan which includes CT head without contrast with pt at bedside and pt agreed to plan.  Labs Reviewed - No data to display  I, [name], MD, personally reviewed and evaluated these images and lab results as part of my medical decision-making.  Imaging Review Dg Chest 2 View  04/18/2015   CLINICAL DATA:  Chest pain today.  History of pneumonia, obesity.  EXAM: CHEST  2 VIEW  COMPARISON:  Chest radiograph April 09, 2015  FINDINGS: Cardiomediastinal silhouette is normal. Mild peribronchial wall thickening. The lungs are clear without pleural effusions or focal consolidations. Trachea projects midline and there is no pneumothorax. Soft tissue planes and included osseous structures are non-suspicious.  IMPRESSION:  Mild bronchitic changes without focal consolidation.   Electronically Signed   By: Awilda Metro M.D.   On: 04/18/2015 22:20   Ct Head Wo Contrast  04/19/2015   CLINICAL DATA:  Left-sided numbness for 24 hr. Sharp pain on the left side.  EXAM: CT HEAD WITHOUT CONTRAST  TECHNIQUE: Contiguous axial images were obtained from the base of the skull through the vertex without intravenous contrast.  COMPARISON:  08/26/2013  FINDINGS: Examination technically limited due to motion artifact. Ventricles and sulci appear symmetrical. No mass effect or midline shift. No abnormal extra-axial fluid collections. Gray-white matter junctions are distinct. Basal cisterns are not effaced. No evidence of acute intracranial hemorrhage. No depressed skull fractures. Visualized paranasal sinuses and mastoid air cells are not opacified.  IMPRESSION: No acute intracranial abnormalities.   Electronically Signed   By: Burman Nieves M.D.    On: 04/19/2015 01:51    EKG Interpretation  Date/Time:  Wednesday April 19 2015 00:45:40 EDT Ventricular Rate:  80 PR Interval:  168 QRS Duration: 86 QT Interval:  375 QTC Calculation: 433 R Axis:   41 Text Interpretation:  Sinus rhythm Low voltage, precordial leads Borderline T abnormalities, diffuse leads Confirmed by Ranae Palms  MD, Eline Geng ([id number]) on 04/19/2015 1:06:12 AM       MDM   Final diagnoses:  Numbness  Chest wall pain  Muscle strain   I personally performed the services described in this documentation, which was scribed in my presence. The recorded information has been reviewed and is accurate.    EKG, troponin and chest x-ray are within normal limits. Patient's blood work including electrolytes are normal. CT head without any acute findings. I have little to no suspicion for coronary artery disease. Her chest pain is completely reproduced and appears to be muscle cells nature. We'll treat symptomatically with NSAID and muscle relaxants. Patient's numbness will need to be followed up by neurology as an outpatient. May need MRI at that point. Do not believe that emergent further imaging is necessary. Patient understands need follow-up and to return immediately for worsening symptoms.   [name], MD 04/19/15 [phone]

## 2015-04-19 NOTE — ED Notes (Signed)
MD at bedside. 

## 2015-04-19 NOTE — ED Notes (Signed)
Pt states ever since she woke up Tuesday morning her left side has been numb feeling  Pt states about 11 am Tuesday morning she started having chest pain   Pt states the pain is on the left side of her chest and it feels like her chest is going to cave in  Pt states when she takes a deep breath it hurts in her left rib area  Pt states she has been at [facility] since 9pm tonight and never made it to a room so she came here  Pt states they did a xray, EKG, and blood work while she was there

## 2015-05-11 ENCOUNTER — Ambulatory Visit: Payer: Self-pay | Admitting: Neurology

## 2015-05-11 ENCOUNTER — Telehealth: Payer: Self-pay | Admitting: *Deleted

## 2015-05-11 NOTE — Telephone Encounter (Signed)
No showed new patient appointment. 

## 2015-05-12 ENCOUNTER — Encounter: Payer: Self-pay | Admitting: Neurology

## 2015-07-03 ENCOUNTER — Emergency Department (HOSPITAL_COMMUNITY)
Admission: EM | Admit: 2015-07-03 | Discharge: 2015-07-03 | Disposition: A | Discharge status: Home / Self Care | Payer: Medicaid Other | Attending: Emergency Medicine | Admitting: Emergency Medicine

## 2015-07-03 ENCOUNTER — Encounter (HOSPITAL_COMMUNITY): Payer: Self-pay | Admitting: Family Medicine

## 2015-07-03 DIAGNOSIS — Z8701 Personal history of pneumonia (recurrent): Secondary | ICD-10-CM | POA: Insufficient documentation

## 2015-07-03 DIAGNOSIS — Z87448 Personal history of other diseases of urinary system: Secondary | ICD-10-CM | POA: Insufficient documentation

## 2015-07-03 DIAGNOSIS — R059 Cough, unspecified: Secondary | ICD-10-CM

## 2015-07-03 DIAGNOSIS — Z8619 Personal history of other infectious and parasitic diseases: Secondary | ICD-10-CM | POA: Diagnosis not present

## 2015-07-03 DIAGNOSIS — J069 Acute upper respiratory infection, unspecified: Secondary | ICD-10-CM | POA: Insufficient documentation

## 2015-07-03 DIAGNOSIS — Z720 Tobacco use: Secondary | ICD-10-CM | POA: Diagnosis not present

## 2015-07-03 DIAGNOSIS — Z8349 Family history of other endocrine, nutritional and metabolic diseases: Secondary | ICD-10-CM | POA: Diagnosis not present

## 2015-07-03 DIAGNOSIS — Z8614 Personal history of Methicillin resistant Staphylococcus aureus infection: Secondary | ICD-10-CM | POA: Insufficient documentation

## 2015-07-03 DIAGNOSIS — Z8659 Personal history of other mental and behavioral disorders: Secondary | ICD-10-CM | POA: Insufficient documentation

## 2015-07-03 DIAGNOSIS — R05 Cough: Secondary | ICD-10-CM

## 2015-07-03 DIAGNOSIS — Z8679 Personal history of other diseases of the circulatory system: Secondary | ICD-10-CM | POA: Insufficient documentation

## 2015-07-03 MED ORDER — BENZONATATE 100 MG PO CAPS: 100.0000 mg | ORAL_CAPSULE | Freq: Three times a day (TID) | ORAL | Status: DC

## 2015-07-03 NOTE — ED Provider Notes (Signed)
CSN: [id number]     Arrival date & time 07/03/15  1625 History  By signing my name below, I, [facility]arr[facility] Green, attest that this documentation has been prepared under the direction and in the presence of [employer] MillsBenjamin Lyrik Dockstader, PA-C. Electronically Signed: Randell PatientMarrissa Green, ED Scribe. 07/03/2015. 5:32 PM.   Chief Complaint  Green presents with  . Cough    The history is provided by the Green. No language interpreter was used.   HPI Comments: Lady DeutscherChristina E Green is a 29 y.o. female who presents to the Emergency Department complaining of cough onset 1 week ago. Green reports smoking a cigar and began coughing. Green reports associated vomiting bloody muscus, post-tussive emesis, dry tongue, chest pain only with cough, and trouble sleeping due to cough. Green has taken Nyquil without relief. Green reports not receiving flu vaccine this year. Green denies fever, SOB, sore throat, leg swelling, recent travel or surgeries.     Past Medical History  Diagnosis Date  . Pyelonephritis   . Chlamydia   . Cervical cyst   . High cholesterol   . Pneumonia 2011  . Exertional shortness of breath   . BJYNWGNF(621.3Headache(784.0)     "weekly" (07/08/2013)  . Migraine     "monthly" (07/08/2013)  . Depression   . Anxiety   . Blood transfusion without reported diagnosis   . MRSA (methicillin resistant staph aureus) culture positive    Past Surgical History  Procedure Laterality Date  . Cesarean section  2008  . Tibia im nail insertion Left 08/26/2013    Procedure: INTRAMEDULLARY (IM) NAIL TIBIAL;  Surgeon: Sheral Apleyimothy D Murphy, MD;  Location: [facility] OR;  Service: Orthopedics;  Laterality: Left;   Family History  Problem Relation Age of Onset  . Diabetes Other   . Hypertension Other   . Cancer Other    Social History  Substance Use Topics  . Smoking status: Current Every Day Smoker -- 0.25 packs/day for 9 years    Types: Cigarettes  . Smokeless tobacco: Never Used  . Alcohol Use: 2.4 oz/week    4 Cans  of beer per week   OB History    Gravida Para Term Preterm AB TAB SAB Ectopic Multiple Living   1 1 1       1      Review of Systems  Constitutional: Negative for fever.  Respiratory: Positive for cough.   All other systems reviewed and are negative.     Allergies  Tramadol  Home Medications   Prior to Admission medications   Medication Sig Start Date End Date Taking? Authorizing Provider  Pseudoeph-Doxylamine-DM-APAP (NIGHTTIME COLD PO) Take 5 mLs by mouth every 8 (eight) hours as needed. For cold symptoms   Yes Historical Provider, MD  benzonatate (TESSALON) 100 MG capsule Take 1 capsule (100 mg total) by mouth every 8 (eight) hours. 07/03/15   Joycie PeekBenjamin Brindley Madarang, PA-C  ibuprofen (ADVIL,MOTRIN) 600 MG tablet Take 1 tablet (600 mg total) by mouth every 6 (six) hours as needed. Green not taking: Reported on 07/03/2015 04/19/15   Loren Raceravid Yelverton, MD  methocarbamol (ROBAXIN) 500 MG tablet Take 2 tablets (1,000 mg total) by mouth every 8 (eight) hours as needed for muscle spasms. Green not taking: Reported on 07/03/2015 04/19/15   Loren Raceravid Yelverton, MD   BP 129/94 mmHg  Pulse 80  Temp(Src) 99.3 F (37.4 C) (Oral)  Resp 18  SpO2 100%  LMP 06/27/2015 Physical Exam  Constitutional: She is oriented to person, place, and time. She appears well-developed and well-nourished. No  distress.  HENT:  Head: Normocephalic and atraumatic.  Eyes: Conjunctivae and EOM are normal.  Neck: Neck supple. No tracheal deviation present.  Cardiovascular: Normal rate and normal heart sounds.   Pulmonary/Chest: Effort normal and breath sounds normal. No respiratory distress.  Lungs CTA bilaterally.  Abdominal: Soft. There is no tenderness.  Musculoskeletal: Normal range of motion. She exhibits no edema.  Neurological: She is alert and oriented to person, place, and time.  Skin: Skin is warm and dry.  Psychiatric: She has a normal mood and affect. Her behavior is normal.  Nursing note and vitals  reviewed.   ED Course  Procedures  DIAGNOSTIC STUDIES: Oxygen Saturation is 98% on RA, normal by my interpretation.    COORDINATION OF CARE: 5:25 PM Will prescribe benzonatate. Green acknowledges and agrees to plan.  Labs Review Labs Reviewed - No data to display  Imaging Review No results found. I have personally reviewed and evaluated these images and lab results as part of my medical decision-making.   EKG Interpretation None     Meds given in ED:  Medications - No data to display  Discharge Medication List as of 07/03/2015  6:08 PM    START taking these medications   Details  benzonatate (TESSALON) 100 MG capsule Take 1 capsule (100 mg total) by mouth every 8 (eight) hours., Starting 07/03/2015, Until Discontinued, Print       Filed Vitals:   07/03/15 1638 07/03/15 1800  BP: 133/91 129/94  Pulse: 78 80  Temp: 98.3 F (36.8 C) 99.3 F (37.4 C)  TempSrc: Oral Oral  Resp: 20 18  SpO2: 98% 100%    MDM  Patients symptoms are consistent with URI, likely viral etiology. Doubt PE or other acute cardiopulmonary pathology. Discussed that antibiotics are not indicated for viral infections. Pt will be discharged with symptomatic treatment.  Verbalizes understanding and is agreeable with plan. Pt is hemodynamically stable, normal vital signs, afebrile & in NAD prior to dc.  Final diagnoses:  Cough  URI (upper respiratory infection)    I personally performed the services described in this documentation, which was scribed in my presence. The recorded information has been reviewed and is accurate.    Joycie Peek, PA-C 07/03/15 1858  Pricilla Loveless, MD 07/05/15 [phone]

## 2015-07-03 NOTE — Discharge Instructions (Signed)
Your symptoms are likely due to a viral upper respiratory infection. Please take your medications as prescribed for the cough. Follow up with your doctor as needed in one week for reevaluation. Return to ED for worsening symptoms.  Cough, Adult A cough helps to clear your throat and lungs. A cough may last only 2-3 weeks (acute), or it may last longer than 8 weeks (chronic). Many different things can cause a cough. A cough may be a sign of an illness or another medical condition. HOME CARE  Pay attention to any changes in your cough.  Take medicines only as told by your doctor.  If you were prescribed an antibiotic medicine, take it as told by your doctor. Do not stop taking it even if you start to feel better.  Talk with your doctor before you try using a cough medicine.  Drink enough fluid to keep your pee (urine) clear or pale yellow.  If the air is dry, use a cold steam vaporizer or humidifier in your home.  Stay away from things that make you cough at work or at home.  If your cough is worse at night, try using extra pillows to raise your head up higher while you sleep.  Do not smoke, and try not to be around smoke. If you need help quitting, ask your doctor.  Do not have caffeine.  Do not drink alcohol.  Rest as needed. GET HELP IF:  You have new problems (symptoms).  You cough up yellow fluid (pus).  Your cough does not get better after 2-3 weeks, or your cough gets worse.  Medicine does not help your cough and you are not sleeping well.  You have pain that gets worse or pain that is not helped with medicine.  You have a fever.  You are losing weight and you do not know why.  You have night sweats. GET HELP RIGHT AWAY IF:  You cough up blood.  You have trouble breathing.  Your heartbeat is very fast.   This information is not intended to replace advice given to you by your health care provider. Make sure you discuss any questions you have with your health  care provider.   Document Released: 04/25/2011 Document Revised: 05/03/2015 Document Reviewed: 10/19/2014 Elsevier Interactive Patient Education 2016 Elsevier Inc.  Upper Respiratory Infection, Adult Most upper respiratory infections (URIs) are a viral infection of the air passages leading to the lungs. A URI affects the nose, throat, and upper air passages. The most common type of URI is nasopharyngitis and is typically referred to as "the common cold." URIs run their course and usually go away on their own. Most of the time, a URI does not require medical attention, but sometimes a bacterial infection in the upper airways can follow a viral infection. This is called a secondary infection. Sinus and middle ear infections are common types of secondary upper respiratory infections. Bacterial pneumonia can also complicate a URI. A URI can worsen asthma and chronic obstructive pulmonary disease (COPD). Sometimes, these complications can require emergency medical care and may be life threatening.  CAUSES Almost all URIs are caused by viruses. A virus is a type of germ and can spread from one person to another.  RISKS FACTORS You may be at risk for a URI if:   You smoke.   You have chronic heart or lung disease.  You have a weakened defense (immune) system.   You are very young or very old.   You have nasal  allergies or asthma.  You work in crowded or poorly ventilated areas.  You work in health care facilities or schools. SIGNS AND SYMPTOMS  Symptoms typically develop 2-3 days after you come in contact with a cold virus. Most viral URIs last 7-10 days. However, viral URIs from the influenza virus (flu virus) can last 14-18 days and are typically more severe. Symptoms may include:   Runny or stuffy (congested) nose.   Sneezing.   Cough.   Sore throat.   Headache.   Fatigue.   Fever.   Loss of appetite.   Pain in your forehead, behind your eyes, and over your  cheekbones (sinus pain).  Muscle aches.  DIAGNOSIS  Your health care provider may diagnose a URI by:  Physical exam.  Tests to check that your symptoms are not due to another condition such as:  Strep throat.  Sinusitis.  Pneumonia.  Asthma. TREATMENT  A URI goes away on its own with time. It cannot be cured with medicines, but medicines may be prescribed or recommended to relieve symptoms. Medicines may help:  Reduce your fever.  Reduce your cough.  Relieve nasal congestion. HOME CARE INSTRUCTIONS   Take medicines only as directed by your health care provider.   Gargle warm saltwater or take cough drops to comfort your throat as directed by your health care provider.  Use a warm mist humidifier or inhale steam from a shower to increase air moisture. This may make it easier to breathe.  Drink enough fluid to keep your urine clear or pale yellow.   Eat soups and other clear broths and maintain good nutrition.   Rest as needed.   Return to work when your temperature has returned to normal or as your health care provider advises. You may need to stay home longer to avoid infecting others. You can also use a face mask and careful hand washing to prevent spread of the virus.  Increase the usage of your inhaler if you have asthma.   Do not use any tobacco products, including cigarettes, chewing tobacco, or electronic cigarettes. If you need help quitting, ask your health care provider. PREVENTION  The best way to protect yourself from getting a cold is to practice good hygiene.   Avoid oral or hand contact with people with cold symptoms.   Wash your hands often if contact occurs.  There is no clear evidence that vitamin C, vitamin E, echinacea, or exercise reduces the chance of developing a cold. However, it is always recommended to get plenty of rest, exercise, and practice good nutrition.  SEEK MEDICAL CARE IF:   You are getting worse rather than better.    Your symptoms are not controlled by medicine.   You have chills.  You have worsening shortness of breath.  You have brown or red mucus.  You have yellow or brown nasal discharge.  You have pain in your face, especially when you bend forward.  You have a fever.  You have swollen neck glands.  You have pain while swallowing.  You have white areas in the back of your throat. SEEK IMMEDIATE MEDICAL CARE IF:   You have severe or persistent:  Headache.  Ear pain.  Sinus pain.  Chest pain.  You have chronic lung disease and any of the following:  Wheezing.  Prolonged cough.  Coughing up blood.  A change in your usual mucus.  You have a stiff neck.  You have changes in your:  Vision.  Hearing.  Thinking.  Mood. MAKE SURE YOU:   Understand these instructions.  Will watch your condition.  Will get help right away if you are not doing well or get worse.   This information is not intended to replace advice given to you by your health care provider. Make sure you discuss any questions you have with your health care provider.   Document Released: 02/05/2001 Document Revised: 12/27/2014 Document Reviewed: 11/17/2013 Elsevier Interactive Patient Education [employer]

## 2015-07-03 NOTE — ED Notes (Signed)
Pt here for cough and cold symptoms. sts coughing up blood tinged mucous. sts all started after she smoked a cigar.

## 2015-07-24 ENCOUNTER — Emergency Department
Admission: EM | Admit: 2015-07-24 | Discharge: 2015-07-24 | Disposition: A | Discharge status: Home / Self Care | Payer: Medicaid Other | Attending: Emergency Medicine | Admitting: Emergency Medicine

## 2015-07-24 ENCOUNTER — Encounter: Payer: Self-pay | Admitting: Medical Oncology

## 2015-07-24 DIAGNOSIS — F17210 Nicotine dependence, cigarettes, uncomplicated: Secondary | ICD-10-CM | POA: Diagnosis not present

## 2015-07-24 DIAGNOSIS — L02411 Cutaneous abscess of right axilla: Secondary | ICD-10-CM | POA: Diagnosis present

## 2015-07-24 DIAGNOSIS — L02412 Cutaneous abscess of left axilla: Secondary | ICD-10-CM | POA: Insufficient documentation

## 2015-07-24 MED ORDER — HYDROCODONE-ACETAMINOPHEN 5-325 MG PO TABS: 1.0000 | ORAL_TABLET | ORAL | Status: DC | PRN

## 2015-07-24 MED ORDER — IBUPROFEN 800 MG PO TABS: 800.0000 mg | ORAL_TABLET | Freq: Three times a day (TID) | ORAL | Status: DC | PRN

## 2015-07-24 MED ORDER — SULFAMETHOXAZOLE-TRIMETHOPRIM 800-160 MG PO TABS: 1.0000 | ORAL_TABLET | Freq: Two times a day (BID) | ORAL | Status: DC

## 2015-07-24 NOTE — Discharge Instructions (Signed)

## 2015-07-24 NOTE — ED Notes (Signed)
Pt reports abscesses under both arms.
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
## 2015-07-24 NOTE — ED Notes (Addendum)
Noticed possible abscess area under both arms a few days ago the area under right arm is red and draining slightly.. Left arm area is dry

## 2015-08-31 ENCOUNTER — Emergency Department (HOSPITAL_COMMUNITY)
Admission: EM | Admit: 2015-08-31 | Discharge: 2015-08-31 | Disposition: A | Discharge status: Home / Self Care | Payer: Medicaid Other | Attending: Emergency Medicine | Admitting: Emergency Medicine

## 2015-08-31 ENCOUNTER — Encounter (HOSPITAL_COMMUNITY): Payer: Self-pay | Admitting: Neurology

## 2015-08-31 ENCOUNTER — Emergency Department (HOSPITAL_COMMUNITY): Payer: Medicaid Other

## 2015-08-31 DIAGNOSIS — Z8659 Personal history of other mental and behavioral disorders: Secondary | ICD-10-CM | POA: Diagnosis not present

## 2015-08-31 DIAGNOSIS — Z8614 Personal history of Methicillin resistant Staphylococcus aureus infection: Secondary | ICD-10-CM | POA: Diagnosis not present

## 2015-08-31 DIAGNOSIS — Z8742 Personal history of other diseases of the female genital tract: Secondary | ICD-10-CM | POA: Insufficient documentation

## 2015-08-31 DIAGNOSIS — Z8679 Personal history of other diseases of the circulatory system: Secondary | ICD-10-CM | POA: Diagnosis not present

## 2015-08-31 DIAGNOSIS — J069 Acute upper respiratory infection, unspecified: Secondary | ICD-10-CM | POA: Diagnosis not present

## 2015-08-31 DIAGNOSIS — Z8619 Personal history of other infectious and parasitic diseases: Secondary | ICD-10-CM | POA: Insufficient documentation

## 2015-08-31 DIAGNOSIS — Z8639 Personal history of other endocrine, nutritional and metabolic disease: Secondary | ICD-10-CM | POA: Diagnosis not present

## 2015-08-31 DIAGNOSIS — F17210 Nicotine dependence, cigarettes, uncomplicated: Secondary | ICD-10-CM | POA: Diagnosis not present

## 2015-08-31 DIAGNOSIS — Z792 Long term (current) use of antibiotics: Secondary | ICD-10-CM | POA: Diagnosis not present

## 2015-08-31 DIAGNOSIS — Z8701 Personal history of pneumonia (recurrent): Secondary | ICD-10-CM | POA: Diagnosis not present

## 2015-08-31 DIAGNOSIS — R05 Cough: Secondary | ICD-10-CM | POA: Diagnosis present

## 2015-08-31 MED ORDER — GUAIFENESIN 100 MG/5ML PO LIQD: 100.0000 mg | ORAL | Status: DC | PRN

## 2015-08-31 NOTE — ED Notes (Signed)
Declined W/C at D/C and was escorted to lobby by RN. 

## 2015-08-31 NOTE — Discharge Instructions (Signed)

## 2015-08-31 NOTE — ED Notes (Signed)
Pt reports cough with productive green mucus for 4 days, prior has had URI for 1 month. Sometimes she coughs so hard that she makes herself vomit.

## 2015-08-31 NOTE — ED Provider Notes (Signed)
CSN: [id number]     Arrival date & time 08/31/15  4098 History  By signing my name below, I, Emma Green, attest that this documentation has been prepared under the direction and in the presence of Emma Horseman, PA-C Electronically Signed: Charline Green, ED Scribe 08/31/2015 at 9:27 AM.   Chief Complaint  Patient presents with  . Cough   The history is provided by the patient. No language interpreter was used.   HPI Comments: Emma Green is a 30 y.o. female who presents to the Emergency Department with a chief complaint of gradually worsening productive cough with green mucus for the past month. Pt reports associated sore throat that she describes as a burning sensation and is exacerbated with swallowing. She denies fever. Allergy to Tramadol.   Past Medical History  Diagnosis Date  . Pyelonephritis   . Chlamydia   . Cervical cyst   . High cholesterol   . Pneumonia 2011  . Exertional shortness of breath   . JXBJYNWG(956.2)     "weekly" (07/08/2013)  . Migraine     "monthly" (07/08/2013)  . Depression   . Anxiety   . Blood transfusion without reported diagnosis   . MRSA (methicillin resistant staph aureus) culture positive    Past Surgical History  Procedure Laterality Date  . Cesarean section  2008  . Tibia im nail insertion Left 08/26/2013    Procedure: INTRAMEDULLARY (IM) NAIL TIBIAL;  Surgeon: Emma Apley, MD;  Location: [facility] OR;  Service: Orthopedics;  Laterality: Left;   Family History  Problem Relation Age of Onset  . Diabetes Other   . Hypertension Other   . Cancer Other    Social History  Substance Use Topics  . Smoking status: Current Every Day Smoker -- 0.25 packs/day for 9 years    Types: Cigarettes  . Smokeless tobacco: Never Used  . Alcohol Use: 2.4 oz/week    4 Cans of beer per week   OB History    Gravida Para Term Preterm AB TAB SAB Ectopic Multiple Living   1 1 1       1      Review of Systems  Constitutional: Positive for chills.  Negative for fever.  HENT: Positive for postnasal drip, rhinorrhea, sinus pressure, sneezing and sore throat.   Respiratory: Positive for cough. Negative for shortness of breath.   Cardiovascular: Negative for chest pain.  Gastrointestinal: Negative for nausea, vomiting, abdominal pain, diarrhea and constipation.  Genitourinary: Negative for dysuria.   Allergies  Tramadol  Home Medications   Prior to Admission medications   Medication Sig Start Date End Date Taking? Authorizing Provider  HYDROcodone-acetaminophen (NORCO) 5-325 MG tablet Take 1-2 tablets by mouth every 4 (four) hours as needed for moderate pain. 07/24/15   Emma Sheer Beers, PA-C  ibuprofen (ADVIL,MOTRIN) 800 MG tablet Take 1 tablet (800 mg total) by mouth every 8 (eight) hours as needed. 07/24/15   Emma Sheer Beers, PA-C  Pseudoeph-Doxylamine-DM-APAP (NIGHTTIME COLD PO) Take 5 mLs by mouth every 8 (eight) hours as needed. For cold symptoms    Historical Provider, MD  sulfamethoxazole-trimethoprim (BACTRIM DS,SEPTRA DS) 800-160 MG tablet Take 1 tablet by mouth 2 (two) times daily. 07/24/15   Emma Sheer Beers, PA-C   BP 158/93 mmHg  Pulse 88  Temp(Src) 98.9 F (37.2 C) (Oral)  Resp 18  SpO2 96%  LMP 05/21/2015 Physical Exam Physical Exam  Constitutional: Pt  is oriented to person, place, and time. Appears well-developed and well-nourished. No distress.  HENT:  Head: Normocephalic and atraumatic.  Right Ear: Tympanic membrane, external ear and ear canal normal.  Left Ear: Tympanic membrane, external ear and ear canal normal.  Nose: Mucosal edema and mild rhinorrhea present. No epistaxis. Right sinus exhibits no maxillary sinus tenderness and no frontal sinus tenderness. Left sinus exhibits no maxillary sinus tenderness and no frontal sinus tenderness.  Mouth/Throat: Uvula is midline and mucous membranes are normal. Mucous membranes are not pale and not cyanotic. No oropharyngeal exudate, posterior oropharyngeal edema,  posterior oropharyngeal erythema or tonsillar abscesses.  Eyes: Conjunctivae are normal. Pupils are equal, round, and reactive to light.  Neck: Normal range of motion and full passive range of motion without pain.  Cardiovascular: Normal rate and intact distal pulses.   Pulmonary/Chest: Effort normal and breath sounds normal. No stridor.  Clear and equal breath sounds without focal wheezes, rhonchi, rales  Abdominal: Soft. Bowel sounds are normal. There is no tenderness.  Musculoskeletal: Normal range of motion.  Lymphadenopathy:    Pthas no cervical adenopathy.  Neurological: Pt is alert and oriented to person, place, and time.  Skin: Skin is warm and dry. No rash noted. Pt is not diaphoretic.  Psychiatric: Normal mood and affect.  Nursing note and vitals reviewed.  ED Course  Procedures (including critical care time) DIAGNOSTIC STUDIES: Oxygen Saturation is 96% on RA, adequate by my interpretation.    COORDINATION OF CARE: 9:03 AM-Discussed treatment plan which includes CXR with pt at bedside and pt agreed to plan.    Imaging Review Dg Chest 2 View  08/31/2015  CLINICAL DATA:  Productive cough 1 month EXAM: CHEST  2 VIEW COMPARISON:  04/18/2015 FINDINGS: Mild cardiac enlargement. Negative for heart failure. Lungs are clear without infiltrate effusion or mass. IMPRESSION: No active cardiopulmonary disease. Electronically Signed   By: Marlan Green  Clark M.D.   On: 08/31/2015 09:21   I have personally reviewed and evaluated these images and lab results as part of my medical decision-making.   MDM   Final diagnoses:  URI (upper respiratory infection)    Pt CXR negative for acute infiltrate. Patients symptoms are consistent with URI, likely viral etiology. Discussed that antibiotics are not indicated for viral infections. Pt will be discharged with symptomatic treatment.  Verbalizes understanding and is agreeable with plan. Pt is hemodynamically stable & in NAD prior to dc.   I  personally performed the services described in this documentation, which was scribed in my presence. The recorded information has been reviewed and is accurate.      Emma Horsemanobert Joelene Barriere, PA-C 08/31/15 1109  Emma Guiseana Duo Liu, MD 08/31/15 [phone]

## 2016-03-11 ENCOUNTER — Encounter (HOSPITAL_COMMUNITY): Payer: Self-pay | Admitting: Emergency Medicine

## 2016-03-11 ENCOUNTER — Emergency Department (HOSPITAL_COMMUNITY)
Admission: EM | Admit: 2016-03-11 | Discharge: 2016-03-11 | Disposition: A | Discharge status: Home / Self Care | Payer: Medicaid Other | Attending: Emergency Medicine | Admitting: Emergency Medicine

## 2016-03-11 DIAGNOSIS — L02411 Cutaneous abscess of right axilla: Secondary | ICD-10-CM | POA: Insufficient documentation

## 2016-03-11 DIAGNOSIS — L02412 Cutaneous abscess of left axilla: Secondary | ICD-10-CM | POA: Insufficient documentation

## 2016-03-11 DIAGNOSIS — F17210 Nicotine dependence, cigarettes, uncomplicated: Secondary | ICD-10-CM | POA: Insufficient documentation

## 2016-03-11 DIAGNOSIS — N390 Urinary tract infection, site not specified: Secondary | ICD-10-CM | POA: Diagnosis not present

## 2016-03-11 DIAGNOSIS — L02419 Cutaneous abscess of limb, unspecified: Secondary | ICD-10-CM

## 2016-03-11 DIAGNOSIS — R1030 Lower abdominal pain, unspecified: Secondary | ICD-10-CM | POA: Diagnosis present

## 2016-03-11 LAB — CBC
HCT: 38.6 % (ref 36.0–46.0)
HEMOGLOBIN: 12.5 g/dL (ref 12.0–15.0)
MCH: 28.1 pg (ref 26.0–34.0)
MCHC: 32.4 g/dL (ref 30.0–36.0)
MCV: 86.7 fL (ref 78.0–100.0)
Platelets: 294 10*3/uL (ref 150–400)
RBC: 4.45 MIL/uL (ref 3.87–5.11)
RDW: 14.2 % (ref 11.5–15.5)
WBC: 7 10*3/uL (ref 4.0–10.5)

## 2016-03-11 LAB — URINALYSIS, ROUTINE W REFLEX MICROSCOPIC
Bilirubin Urine: NEGATIVE
GLUCOSE, UA: NEGATIVE mg/dL
HGB URINE DIPSTICK: NEGATIVE
Ketones, ur: NEGATIVE mg/dL
Nitrite: NEGATIVE
Protein, ur: NEGATIVE mg/dL
SPECIFIC GRAVITY, URINE: 1.027 (ref 1.005–1.030)
pH: 6 (ref 5.0–8.0)

## 2016-03-11 LAB — COMPREHENSIVE METABOLIC PANEL
ALT: 25 U/L (ref 14–54)
ANION GAP: 5 (ref 5–15)
AST: 20 U/L (ref 15–41)
Albumin: 3.9 g/dL (ref 3.5–5.0)
Alkaline Phosphatase: 65 U/L (ref 38–126)
BUN: 9 mg/dL (ref 6–20)
CHLORIDE: 108 mmol/L (ref 101–111)
CO2: 27 mmol/L (ref 22–32)
Calcium: 8.9 mg/dL (ref 8.9–10.3)
Creatinine, Ser: 0.73 mg/dL (ref 0.44–1.00)
GFR calc non Af Amer: >60 mL/min (ref >60)
Glucose, Bld: 82 mg/dL (ref 65–99)
Potassium: 3.5 mmol/L (ref 3.5–5.1)
SODIUM: 140 mmol/L (ref 135–145)
Total Bilirubin: 0.5 mg/dL (ref 0.3–1.2)
Total Protein: 7.4 g/dL (ref 6.5–8.1)

## 2016-03-11 LAB — URINE MICROSCOPIC-ADD ON: RBC / HPF: NONE SEEN RBC/hpf (ref 0–5)

## 2016-03-11 LAB — I-STAT BETA HCG BLOOD, ED (MC, WL, AP ONLY)

## 2016-03-11 LAB — LIPASE, BLOOD: LIPASE: 17 U/L (ref 11–51)

## 2016-03-11 MED ORDER — SULFAMETHOXAZOLE-TRIMETHOPRIM 800-160 MG PO TABS
1.0000 | ORAL_TABLET | Freq: Two times a day (BID) | ORAL | Status: AC
Start: 2016-03-11 — End: 2016-03-18

## 2016-03-11 NOTE — ED Notes (Signed)
Pt states for the last 2 weeks she has had intermittent left sided abd pain that comes and goes. Pt reports urinary frequency also. Pt also has two sores in right and left axilla. appears to be an abscess of open sore. Pt reports history of MRSA.

## 2016-03-11 NOTE — ED Provider Notes (Signed)
CSN: [id number]     Arrival date & time 03/11/16  1441 History   First MD Initiated Contact with Patient 03/11/16 2052     Chief Complaint  Patient presents with  . Abdominal Pain  . Wound Check     (Consider location/radiation/quality/duration/timing/severity/associated sxs/prior Treatment) HPI Comments: Patient is a 30 year old female, she is obese, she presents to the hospital with lower abdominal pain which is suprapubic in nature, she does not have any other urinary symptoms. She also presents with bilateral axillary abscesses which have been recurrent and have been draining pus. The symptoms are intermittent, nothing seems to make them better or worse, she has had to go on antibiotic therapy intermittently in the past for her axillary abscesses. At this time she denies fevers or any other systemic symptoms  Patient is a 30 y.o. female presenting with abdominal pain and wound check. The history is provided by the patient.  Abdominal Pain Wound Check Associated symptoms include abdominal pain.    Past Medical History  Diagnosis Date  . Chlamydia   . Cervical cyst   . High cholesterol   . Pneumonia 2011  . Exertional shortness of breath   . WUJWJXBJ(478.2Headache(784.0)     "weekly" (07/08/2013)  . Migraine     "monthly" (07/08/2013)  . Depression   . Anxiety   . Blood transfusion without reported diagnosis   . MRSA (methicillin resistant staph aureus) culture positive   . Pyelonephritis    Past Surgical History  Procedure Laterality Date  . Cesarean section  2008  . Tibia im nail insertion Left 08/26/2013    Procedure: INTRAMEDULLARY (IM) NAIL TIBIAL;  Surgeon: Sheral Apleyimothy D Murphy, MD;  Location: [facility] OR;  Service: Orthopedics;  Laterality: Left;   Family History  Problem Relation Age of Onset  . Diabetes Other   . Hypertension Other   . Cancer Other    Social History  Substance Use Topics  . Smoking status: Current Every Day Smoker -- 0.25 packs/day for 9 years    Types: Cigarettes   . Smokeless tobacco: Never Used  . Alcohol Use: 2.4 oz/week    4 Cans of beer per week   OB History    Gravida Para Term Preterm AB TAB SAB Ectopic Multiple Living   1 1 1       1      Review of Systems  Gastrointestinal: Positive for abdominal pain.  All other systems reviewed and are negative.     Allergies  Tramadol  Home Medications   Prior to Admission medications   Medication Sig Start Date End Date Taking? Authorizing Provider  acetaminophen (TYLENOL) 500 MG tablet Take 1,000 mg by mouth every 6 (six) hours as needed for moderate pain.   Yes Historical Provider, MD  diphenhydrAMINE (BENADRYL) 25 MG tablet Take 25 mg by mouth every 6 (six) hours as needed for itching or allergies.   Yes Historical Provider, MD  ibuprofen (ADVIL,MOTRIN) 200 MG tablet Take 400 mg by mouth every 6 (six) hours as needed.   Yes Historical Provider, MD  guaiFENesin (ROBITUSSIN) 100 MG/5ML liquid Take 5-10 mLs (100-200 mg total) by mouth every 4 (four) hours as needed for cough. 08/31/15   Roxy Horsemanobert Browning, PA-C  HYDROcodone-acetaminophen (NORCO) 5-325 MG tablet Take 1-2 tablets by mouth every 4 (four) hours as needed for moderate pain. 07/24/15   Charmayne Sheerharles M Beers, PA-C  ibuprofen (ADVIL,MOTRIN) 800 MG tablet Take 1 tablet (800 mg total) by mouth every 8 (eight) hours as needed. 07/24/15  Charmayne Sheer Beers, PA-C  sulfamethoxazole-trimethoprim (BACTRIM DS,SEPTRA DS) 800-160 MG tablet Take 1 tablet by mouth 2 (two) times daily. 07/24/15   Charmayne Sheer Beers, PA-C   BP 135/97 mmHg  Pulse 67  Temp(Src) 97.4 F (36.3 C) (Oral)  Resp 18  Ht  (1.6 m)  Wt 215 lb (97.523 kg)  BMI 38.09 kg/m2  SpO2 96% Physical Exam  Constitutional: She appears well-developed and well-nourished. No distress.  HENT:  Head: Normocephalic and atraumatic.  Mouth/Throat: Oropharynx is clear and moist. No oropharyngeal exudate.  Eyes: Conjunctivae and EOM are normal. Pupils are equal, round, and reactive to light. Right eye  exhibits no discharge. Left eye exhibits no discharge. No scleral icterus.  Neck: Normal range of motion. Neck supple. No JVD present. No thyromegaly present.  Cardiovascular: Normal rate, regular rhythm, normal heart sounds and intact distal pulses.  Exam reveals no gallop and no friction rub.   No murmur heard. Pulmonary/Chest: Effort normal and breath sounds normal. No respiratory distress. She has no wheezes. She has no rales.  Abdominal: Soft. Bowel sounds are normal. She exhibits no distension and no mass. There is tenderness (inimal suprapubic tenderness, no guarding, no other abdominal tenderness ).  Musculoskeletal: Normal range of motion. She exhibits no edema or tenderness.  Lymphadenopathy:    She has no cervical adenopathy.  Neurological: She is alert. Coordination normal.  Skin: Skin is warm and dry. Rash ( Bilateral subcentimeter axillary abscesses which are spontaneously draining, no surrounding erythema or induration, normal range of motion of the bilateral arms) noted. No erythema.  Psychiatric: She has a normal mood and affect. Her behavior is normal.  Nursing note and vitals reviewed.   ED Course  Procedures (including critical care time) Labs Review Labs Reviewed  URINALYSIS, ROUTINE W REFLEX MICROSCOPIC (NOT AT [facility]) - Abnormal; Notable for the following:    APPearance CLOUDY (*)    Leukocytes, UA MODERATE (*)    All other components within normal limits  URINE MICROSCOPIC-ADD ON - Abnormal; Notable for the following:    Squamous Epithelial / LPF 6-30 (*)    Bacteria, UA MANY (*)    All other components within normal limits  URINE CULTURE  LIPASE, BLOOD  COMPREHENSIVE METABOLIC PANEL  CBC  I-STAT BETA HCG BLOOD, ED ([facility], [facility], [facility] ONLY)    Imaging Review No results found. I have personally reviewed and evaluated these images and lab results as part of my medical decision-making.    MDM   Final diagnoses:  UTI (lower urinary tract infection)  Abscess,  axilla    Well-appearing, likely UTI, culture sent, vitals normal, no fever, Bactrim, stable for discharge, patient in agreement.  Meds given in ED:  Medications - No data to display  New Prescriptions   No medications on file      Eber Hong, MD 03/11/16 2124

## 2016-03-11 NOTE — Discharge Instructions (Signed)

## 2016-03-11 NOTE — ED Notes (Signed)
Patient presents with c/o pain to bilateral axilla (opened areas noted to both axilla), also c/o pain to the left flank area.  Denies urinary problems or vaginal discharge or odor.  Vomited earlier today but not since.

## 2016-03-13 LAB — URINE CULTURE

## 2016-06-06 ENCOUNTER — Ambulatory Visit (HOSPITAL_COMMUNITY)
Admission: EM | Admit: 2016-06-06 | Discharge: 2016-06-06 | Disposition: A | Discharge status: Home / Self Care | Payer: Medicaid Other | Attending: Family Medicine | Admitting: Family Medicine

## 2016-06-06 ENCOUNTER — Encounter (HOSPITAL_COMMUNITY): Payer: Self-pay | Admitting: Emergency Medicine

## 2016-06-06 DIAGNOSIS — G44209 Tension-type headache, unspecified, not intractable: Secondary | ICD-10-CM | POA: Diagnosis not present

## 2016-06-06 MED ORDER — KETOROLAC TROMETHAMINE 60 MG/2ML IM SOLN
INTRAMUSCULAR | Status: AC
  Filled 2016-06-06: qty 2

## 2016-06-06 MED ORDER — KETOROLAC TROMETHAMINE 60 MG/2ML IM SOLN
60.0000 mg | Freq: Once | INTRAMUSCULAR | Status: AC
  Administered 2016-06-06: 60 mg via INTRAMUSCULAR

## 2016-06-06 MED ORDER — NAPROXEN 500 MG PO TABS: 500.0000 mg | ORAL_TABLET | Freq: Two times a day (BID) | ORAL | 0 refills | Status: DC

## 2016-06-06 MED ORDER — CYCLOBENZAPRINE HCL 10 MG PO TABS: 10.0000 mg | ORAL_TABLET | Freq: Every day | ORAL | 0 refills | Status: DC

## 2016-06-06 NOTE — ED Provider Notes (Signed)
CSN: [id number]     Arrival date & time 06/06/16  1932 History   None    No chief complaint on file.  (Consider location/radiation/quality/duration/timing/severity/associated sxs/prior Treatment) Patient is c/o headache bilateral temporal region    Headache  Pain location:  L temporal and R temporal Quality:  Dull Radiates to:  Does not radiate Severity currently:  5/10 Onset quality:  Unable to specify Duration:  1 day Timing:  Constant Progression:  Worsening Chronicity:  New Similar to prior headaches: yes   Context: activity   Context: not exposure to bright light, not caffeine, not coughing, not defecating, not eating, not stress, not exposure to cold air, not intercourse, not loud noise and not straining   Relieved by:  Nothing Worsened by:  Nothing   Past Medical History:  Diagnosis Date  . Anxiety   . Blood transfusion without reported diagnosis   . Cervical cyst   . Chlamydia   . Depression   . Exertional shortness of breath   . WUJWJXBJ(478.2)    "weekly" (07/08/2013)  . High cholesterol   . Migraine    "monthly" (07/08/2013)  . MRSA (methicillin resistant staph aureus) culture positive   . Pneumonia 2011  . Pyelonephritis    Past Surgical History:  Procedure Laterality Date  . CESAREAN SECTION  2008  . TIBIA IM NAIL INSERTION Left 08/26/2013   Procedure: INTRAMEDULLARY (IM) NAIL TIBIAL;  Surgeon: Sheral Apley, MD;  Location: [facility] OR;  Service: Orthopedics;  Laterality: Left;   Family History  Problem Relation Age of Onset  . Diabetes Other   . Hypertension Other   . Cancer Other    Social History  Substance Use Topics  . Smoking status: Current Every Day Smoker    Packs/day: 0.25    Years: 9.00    Types: Cigarettes  . Smokeless tobacco: Never Used  . Alcohol use 2.4 oz/week    4 Cans of beer per week   OB History    Gravida Para Term Preterm AB Living   1 1 1     1    SAB TAB Ectopic Multiple Live Births                 Review of  Systems  Constitutional: Negative.   Eyes: Negative.   Respiratory: Negative.   Cardiovascular: Negative.   Gastrointestinal: Negative.   Endocrine: Negative.   Genitourinary: Negative.   Musculoskeletal: Negative.   Allergic/Immunologic: Negative.   Neurological: Positive for headaches.  Hematological: Negative.   Psychiatric/Behavioral: Negative.     Allergies  Tramadol  Home Medications   Prior to Admission medications   Medication Sig Start Date End Date Taking? Authorizing Provider  acetaminophen (TYLENOL) 500 MG tablet Take 1,000 mg by mouth every 6 (six) hours as needed for moderate pain.    Historical Provider, MD  diphenhydrAMINE (BENADRYL) 25 MG tablet Take 25 mg by mouth every 6 (six) hours as needed for itching or allergies.    Historical Provider, MD  guaiFENesin (ROBITUSSIN) 100 MG/5ML liquid Take 5-10 mLs (100-200 mg total) by mouth every 4 (four) hours as needed for cough. 08/31/15   Roxy Horseman, PA-C  HYDROcodone-acetaminophen (NORCO) 5-325 MG tablet Take 1-2 tablets by mouth every 4 (four) hours as needed for moderate pain. 07/24/15   Charmayne Sheer Beers, PA-C  ibuprofen (ADVIL,MOTRIN) 200 MG tablet Take 400 mg by mouth every 6 (six) hours as needed.    Historical Provider, MD  ibuprofen (ADVIL,MOTRIN) 800 MG tablet Take 1 tablet (  800 mg total) by mouth every 8 (eight) hours as needed. 07/24/15   Evangeline Dakinharles M Beers, PA-C   Meds Ordered and Administered this Visit  Medications - No data to display  There were no vitals taken for this visit. No data found.   Physical Exam  Constitutional: She appears well-developed and well-nourished.  HENT:  Head: Normocephalic and atraumatic.  Right Ear: External ear normal.  Left Ear: External ear normal.  Mouth/Throat: Oropharynx is clear and moist.  Eyes: EOM are normal. Pupils are equal, round, and reactive to light.  Neck: Normal range of motion. Neck supple.  Cardiovascular: Normal rate and regular rhythm.    Pulmonary/Chest: Effort normal and breath sounds normal.  Musculoskeletal: She exhibits tenderness.  TTP cervical paraspinous muscles and myofascial discomfort bilateral.  Decreased ROM cervical spine.  Nursing note and vitals reviewed.   Urgent Care Course   Clinical Course    Procedures (including critical care time)  Labs Review Labs Reviewed - No data to display  Imaging Review No results found.   Visual Acuity Review  Right Eye Distance:   Left Eye Distance:   Bilateral Distance:    Right Eye Near:   Left Eye Near:    Bilateral Near:         MDM  Tension Headache  Toradol 60mg  IM Naprosyn 500mg  one po bid x 7 days #14 Cyclobenzaprine 10mg  one po qhs prn #5    Deatra CanterWilliam J Oxford, FNP 06/06/16 2101

## 2016-06-06 NOTE — ED Triage Notes (Signed)
Patient has had headache for 2 weeks.  Denies cough, cold or runny nose.

## 2016-07-01 ENCOUNTER — Emergency Department (HOSPITAL_COMMUNITY)
Admission: EM | Admit: 2016-07-01 | Discharge: 2016-07-01 | Disposition: A | Discharge status: Home / Self Care | Payer: Medicaid Other | Attending: Emergency Medicine | Admitting: Emergency Medicine

## 2016-07-01 ENCOUNTER — Emergency Department (HOSPITAL_COMMUNITY): Payer: Medicaid Other

## 2016-07-01 ENCOUNTER — Encounter (HOSPITAL_COMMUNITY): Payer: Self-pay | Admitting: *Deleted

## 2016-07-01 DIAGNOSIS — F17210 Nicotine dependence, cigarettes, uncomplicated: Secondary | ICD-10-CM | POA: Diagnosis not present

## 2016-07-01 DIAGNOSIS — M542 Cervicalgia: Secondary | ICD-10-CM | POA: Diagnosis present

## 2016-07-01 DIAGNOSIS — M47892 Other spondylosis, cervical region: Secondary | ICD-10-CM | POA: Insufficient documentation

## 2016-07-01 MED ORDER — METHOCARBAMOL 500 MG PO TABS
1000.0000 mg | ORAL_TABLET | Freq: Once | ORAL | Status: AC
  Administered 2016-07-01: 1000 mg via ORAL
  Filled 2016-07-01: qty 2

## 2016-07-01 MED ORDER — LIDOCAINE 5 % EX PTCH: 1.0000 | MEDICATED_PATCH | CUTANEOUS | 0 refills | Status: DC

## 2016-07-01 MED ORDER — METHOCARBAMOL 500 MG PO TABS: 500.0000 mg | ORAL_TABLET | Freq: Two times a day (BID) | ORAL | 0 refills | Status: DC

## 2016-07-01 MED ORDER — NAPROXEN 500 MG PO TABS: 500.0000 mg | ORAL_TABLET | Freq: Two times a day (BID) | ORAL | 0 refills | Status: DC

## 2016-07-01 MED ORDER — KETOROLAC TROMETHAMINE 60 MG/2ML IM SOLN
60.0000 mg | Freq: Once | INTRAMUSCULAR | Status: AC
  Administered 2016-07-01: 60 mg via INTRAMUSCULAR
  Filled 2016-07-01: qty 2

## 2016-07-01 MED ORDER — OXYCODONE-ACETAMINOPHEN 5-325 MG PO TABS
1.0000 | ORAL_TABLET | Freq: Once | ORAL | Status: AC
  Administered 2016-07-01: 1 via ORAL
  Filled 2016-07-01: qty 1
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
## 2016-07-01 NOTE — Discharge Instructions (Signed)
There were no acute abnormalities on your imaging. Take it easy, but do not lay around too much as this may make the stiffness worse. Take 500 mg of naproxen every 12 hours or 800 mg of ibuprofen every 8 hours for the next 3 days. Take these medications with food to avoid upset stomach. Robaxin is a muscle relaxer and may help loosen stiff muscles. Do not take the Robaxin while driving or performing other dangerous activities. You see the attached exercises to strengthen the muscles around your spine. Follow up with a primary care provider for chronic management of this issue.

## 2016-07-01 NOTE — ED Notes (Signed)
Declined W/C at D/C and was escorted to lobby by RN. 

## 2016-07-01 NOTE — ED Triage Notes (Signed)
PT reports a HA over 1 week ,with neck and upper back pain. Pt reports meds have not relived pain.

## 2016-08-31 ENCOUNTER — Emergency Department
Admission: EM | Admit: 2016-08-31 | Discharge: 2016-09-01 | Disposition: A | Discharge status: Home / Self Care | Payer: Medicaid Other | Attending: Emergency Medicine | Admitting: Emergency Medicine

## 2016-08-31 ENCOUNTER — Emergency Department: Payer: Medicaid Other

## 2016-08-31 ENCOUNTER — Encounter: Payer: Self-pay | Admitting: Radiology

## 2016-08-31 DIAGNOSIS — F17210 Nicotine dependence, cigarettes, uncomplicated: Secondary | ICD-10-CM | POA: Diagnosis not present

## 2016-08-31 DIAGNOSIS — Z79899 Other long term (current) drug therapy: Secondary | ICD-10-CM | POA: Insufficient documentation

## 2016-08-31 DIAGNOSIS — J029 Acute pharyngitis, unspecified: Secondary | ICD-10-CM | POA: Diagnosis present

## 2016-08-31 DIAGNOSIS — J020 Streptococcal pharyngitis: Secondary | ICD-10-CM | POA: Insufficient documentation

## 2016-08-31 LAB — BASIC METABOLIC PANEL
Anion gap: 8 (ref 5–15)
BUN: 10 mg/dL (ref 6–20)
CO2: 25 mmol/L (ref 22–32)
Calcium: 9 mg/dL (ref 8.9–10.3)
Chloride: 102 mmol/L (ref 101–111)
Creatinine, Ser: 0.9 mg/dL (ref 0.44–1.00)
GFR calc Af Amer: >60 mL/min (ref >60)
GFR calc non Af Amer: >60 mL/min (ref >60)
Glucose, Bld: 95 mg/dL (ref 65–99)
Potassium: 3.3 mmol/L — ABNORMAL LOW (ref 3.5–5.1)
Sodium: 135 mmol/L (ref 135–145)

## 2016-08-31 LAB — CBC WITH DIFFERENTIAL/PLATELET
Basophils Absolute: 0.1 10*3/uL (ref 0–0.1)
Basophils Relative: 1 %
Eosinophils Absolute: 0.2 10*3/uL (ref 0–0.7)
Eosinophils Relative: 2 %
HCT: 39 % (ref 35.0–47.0)
Hemoglobin: 13.1 g/dL (ref 12.0–16.0)
Lymphocytes Relative: 19 %
Lymphs Abs: 2.3 10*3/uL (ref 1.0–3.6)
MCH: 28.6 pg (ref 26.0–34.0)
MCHC: 33.6 g/dL (ref 32.0–36.0)
MCV: 85.2 fL (ref 80.0–100.0)
Monocytes Absolute: 1.1 10*3/uL — ABNORMAL HIGH (ref 0.2–0.9)
Monocytes Relative: 10 %
Neutro Abs: 8.2 10*3/uL — ABNORMAL HIGH (ref 1.4–6.5)
Neutrophils Relative %: 68 %
Platelets: 292 10*3/uL (ref 150–440)
RBC: 4.58 MIL/uL (ref 3.80–5.20)
RDW: 14.1 % (ref 11.5–14.5)
WBC: 11.9 10*3/uL — ABNORMAL HIGH (ref 3.6–11.0)

## 2016-08-31 MED ORDER — DEXAMETHASONE SODIUM PHOSPHATE 10 MG/ML IJ SOLN
10.0000 mg | Freq: Once | INTRAMUSCULAR | Status: AC
  Administered 2016-08-31: 10 mg via INTRAVENOUS
  Filled 2016-08-31: qty 1

## 2016-08-31 MED ORDER — SODIUM CHLORIDE 0.9 % IV SOLN
3.0000 g | Freq: Once | INTRAVENOUS | Status: AC
  Administered 2016-08-31: 3 g via INTRAVENOUS
  Filled 2016-08-31 (×2): qty 3

## 2016-08-31 MED ORDER — IOPAMIDOL (ISOVUE-300) INJECTION 61%
75.0000 mL | Freq: Once | INTRAVENOUS | Status: AC | PRN
  Administered 2016-08-31: 75 mL via INTRAVENOUS
  Filled 2016-08-31: qty 75

## 2016-08-31 MED ORDER — AMOXICILLIN 500 MG PO CAPS: 500.0000 mg | ORAL_CAPSULE | Freq: Two times a day (BID) | ORAL | 0 refills | Status: DC

## 2016-08-31 MED ORDER — SODIUM CHLORIDE 0.9 % IV BOLUS (SEPSIS)
1000.0000 mL | Freq: Once | INTRAVENOUS | Status: AC
  Administered 2016-08-31: 1000 mL via INTRAVENOUS
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
## 2016-08-31 NOTE — ED Notes (Signed)
Pt reports sore throat since Thursday, bilateral earaches started yesterday; pt reports fever 102 yesterday but has not checked today; vomiting yesterday but none today; pt with hoarse voice; swallowing without difficulty, just increased pain;

## 2016-08-31 NOTE — ED Triage Notes (Signed)
Patient reports having hard time swallowing and when she does her ears hurt.  Patient with no acute respiratory distress noted, able to control own secretions.

## 2016-08-31 NOTE — ED Triage Notes (Signed)
Pt ambulatory to triage. Pt c/o bilateral throat pain, states difficulty swallowing secondary to pain. Pt c/o bilateral ear pain and popping sensation w/ swallowing. Pt has low, hoarse voice w/ difficulty speaking and swallowing saliva.

## 2016-10-03 ENCOUNTER — Emergency Department
Admission: EM | Admit: 2016-10-03 | Discharge: 2016-10-03 | Disposition: A | Discharge status: Home / Self Care | Payer: Medicaid Other | Attending: Emergency Medicine | Admitting: Emergency Medicine

## 2016-10-03 DIAGNOSIS — Z7982 Long term (current) use of aspirin: Secondary | ICD-10-CM | POA: Diagnosis not present

## 2016-10-03 DIAGNOSIS — J020 Streptococcal pharyngitis: Secondary | ICD-10-CM | POA: Diagnosis not present

## 2016-10-03 DIAGNOSIS — J029 Acute pharyngitis, unspecified: Secondary | ICD-10-CM | POA: Diagnosis present

## 2016-10-03 DIAGNOSIS — F17210 Nicotine dependence, cigarettes, uncomplicated: Secondary | ICD-10-CM | POA: Diagnosis not present

## 2016-10-03 LAB — POCT RAPID STREP A: Streptococcus, Group A Screen (Direct): POSITIVE — AB

## 2016-10-03 MED ORDER — AZITHROMYCIN 250 MG PO TABS: ORAL_TABLET | ORAL | 0 refills | Status: DC

## 2016-10-03 NOTE — ED Triage Notes (Signed)
Pt arrives to ER via POV c/o sore throat X 3 days. Pt alert and oriented X4, active, cooperative, pt in NAD. RR even and unlabored, color WNL.
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
## 2016-10-03 NOTE — Discharge Instructions (Signed)
Make an appointment with your doctor.  Start taking the Zpack today and take for the next 5 days. Tylenol or ibuprofen for throat pain or fever.

## 2016-10-29 ENCOUNTER — Emergency Department
Admission: EM | Admit: 2016-10-29 | Discharge: 2016-10-29 | Disposition: A | Discharge status: Home / Self Care | Payer: Medicaid Other | Attending: Emergency Medicine | Admitting: Emergency Medicine

## 2016-10-29 ENCOUNTER — Encounter: Payer: Self-pay | Admitting: Emergency Medicine

## 2016-10-29 DIAGNOSIS — Z79899 Other long term (current) drug therapy: Secondary | ICD-10-CM | POA: Insufficient documentation

## 2016-10-29 DIAGNOSIS — F17210 Nicotine dependence, cigarettes, uncomplicated: Secondary | ICD-10-CM | POA: Diagnosis not present

## 2016-10-29 DIAGNOSIS — K625 Hemorrhage of anus and rectum: Secondary | ICD-10-CM | POA: Insufficient documentation

## 2016-10-29 LAB — CBC
HEMATOCRIT: 38.8 % (ref 35.0–47.0)
HEMOGLOBIN: 12.7 g/dL (ref 12.0–16.0)
MCH: 28.1 pg (ref 26.0–34.0)
MCHC: 32.7 g/dL (ref 32.0–36.0)
MCV: 85.9 fL (ref 80.0–100.0)
Platelets: 287 10*3/uL (ref 150–440)
RBC: 4.52 MIL/uL (ref 3.80–5.20)
RDW: 14.8 % — AB (ref 11.5–14.5)
WBC: 7.6 10*3/uL (ref 3.6–11.0)

## 2016-10-29 LAB — COMPREHENSIVE METABOLIC PANEL
ALT: 31 U/L (ref 14–54)
ANION GAP: 8 (ref 5–15)
AST: 38 U/L (ref 15–41)
Albumin: 4.3 g/dL (ref 3.5–5.0)
Alkaline Phosphatase: 65 U/L (ref 38–126)
BUN: 13 mg/dL (ref 6–20)
CHLORIDE: 100 mmol/L — AB (ref 101–111)
CO2: 28 mmol/L (ref 22–32)
Calcium: 8.9 mg/dL (ref 8.9–10.3)
Creatinine, Ser: 0.83 mg/dL (ref 0.44–1.00)
GFR calc Af Amer: >60 mL/min (ref >60)
GLUCOSE: 97 mg/dL (ref 65–99)
POTASSIUM: 3.5 mmol/L (ref 3.5–5.1)
SODIUM: 136 mmol/L (ref 135–145)
Total Bilirubin: 0.9 mg/dL (ref 0.3–1.2)
Total Protein: 8.7 g/dL — ABNORMAL HIGH (ref 6.5–8.1)

## 2016-10-29 LAB — TYPE AND SCREEN
ABO/RH(D): A POS
ANTIBODY SCREEN: NEGATIVE
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
## 2016-10-29 NOTE — Discharge Instructions (Signed)
As we discussed please monitor her bleeding very carefully, if he continued to have any bleeding that your concerned could be coming from the rectum please call the number provided for a GI medicine appointment. Return to the emergency department for any significant increase in bleeding, abdominal pain or fever.

## 2016-10-29 NOTE — ED Triage Notes (Signed)
Pt reports rectal bleeding since Sunday, reports she started her period but reports bright red blood leaking out of her rectum. Pt denies any injury, denies inserting something into rectum.
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
## 2017-07-02 ENCOUNTER — Emergency Department (HOSPITAL_COMMUNITY)
Admission: EM | Admit: 2017-07-02 | Discharge: 2017-07-02 | Disposition: A | Discharge status: Home / Self Care | Payer: Medicaid Other | Attending: Emergency Medicine | Admitting: Emergency Medicine

## 2017-07-02 ENCOUNTER — Emergency Department (HOSPITAL_COMMUNITY): Payer: Medicaid Other

## 2017-07-02 ENCOUNTER — Encounter (HOSPITAL_COMMUNITY): Payer: Self-pay | Admitting: *Deleted

## 2017-07-02 DIAGNOSIS — R51 Headache: Secondary | ICD-10-CM | POA: Diagnosis not present

## 2017-07-02 DIAGNOSIS — R519 Headache, unspecified: Secondary | ICD-10-CM

## 2017-07-02 DIAGNOSIS — F17210 Nicotine dependence, cigarettes, uncomplicated: Secondary | ICD-10-CM | POA: Insufficient documentation

## 2017-07-02 DIAGNOSIS — M549 Dorsalgia, unspecified: Secondary | ICD-10-CM | POA: Diagnosis present

## 2017-07-02 DIAGNOSIS — M542 Cervicalgia: Secondary | ICD-10-CM

## 2017-07-02 DIAGNOSIS — Z3202 Encounter for pregnancy test, result negative: Secondary | ICD-10-CM | POA: Insufficient documentation

## 2017-07-02 LAB — POC URINE PREG, ED: Preg Test, Ur: NEGATIVE

## 2017-07-02 MED ORDER — METHOCARBAMOL 500 MG PO TABS
1000.0000 mg | ORAL_TABLET | Freq: Once | ORAL | Status: AC
  Administered 2017-07-02: 1000 mg via ORAL
  Filled 2017-07-02: qty 2

## 2017-07-02 MED ORDER — OXYCODONE-ACETAMINOPHEN 5-325 MG PO TABS
1.0000 | ORAL_TABLET | Freq: Once | ORAL | Status: AC
  Administered 2017-07-02: 1 via ORAL
  Filled 2017-07-02: qty 1

## 2017-07-02 MED ORDER — KETOROLAC TROMETHAMINE 60 MG/2ML IM SOLN
60.0000 mg | Freq: Once | INTRAMUSCULAR | Status: AC
  Administered 2017-07-02: 60 mg via INTRAMUSCULAR
  Filled 2017-07-02: qty 2

## 2017-07-02 NOTE — ED Notes (Signed)
pts neg ua preg called to xray so she can be transported to xray
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
## 2017-07-02 NOTE — Discharge Instructions (Signed)
You can take Tylenol or Ibuprofen as directed for pain. You can alternate Tylenol and Ibuprofen every 4 hours. If you take Tylenol at 1pm, then you can take Ibuprofen at 5pm. Then you can take Tylenol again at 9pm.   Do not take any more ibuprofen until later tonight as a medication that we gave you have ibuprofen in it.  As we discussed, follow-up with the referred neurologist for further evaluation.  Return the emergency Department for any worsening pain despite medications, difficult walking, numbness or weakness in her arms or legs, urinary or bowel incontinence, numbness in her groin or any other worsening or concerning symptoms.

## 2017-07-02 NOTE — ED Triage Notes (Signed)
Pt reports neck pain that radiates down into her back, increases with movement. Hx of same. Ambulatory at triage.

## 2017-09-02 ENCOUNTER — Encounter: Payer: Self-pay | Admitting: Emergency Medicine

## 2017-09-02 ENCOUNTER — Emergency Department
Admission: EM | Admit: 2017-09-02 | Discharge: 2017-09-02 | Disposition: A | Discharge status: Home / Self Care | Payer: Medicaid Other | Attending: Emergency Medicine | Admitting: Emergency Medicine

## 2017-09-02 DIAGNOSIS — F17210 Nicotine dependence, cigarettes, uncomplicated: Secondary | ICD-10-CM | POA: Insufficient documentation

## 2017-09-02 DIAGNOSIS — Z79899 Other long term (current) drug therapy: Secondary | ICD-10-CM | POA: Diagnosis not present

## 2017-09-02 DIAGNOSIS — R102 Pelvic and perineal pain: Secondary | ICD-10-CM | POA: Diagnosis present

## 2017-09-02 DIAGNOSIS — N760 Acute vaginitis: Secondary | ICD-10-CM | POA: Diagnosis not present

## 2017-09-02 DIAGNOSIS — N750 Cyst of Bartholin's gland: Secondary | ICD-10-CM | POA: Diagnosis not present

## 2017-09-02 DIAGNOSIS — B9689 Other specified bacterial agents as the cause of diseases classified elsewhere: Secondary | ICD-10-CM

## 2017-09-02 LAB — WET PREP, GENITAL
Sperm: NONE SEEN
TRICH WET PREP: NONE SEEN
Yeast Wet Prep HPF POC: NONE SEEN

## 2017-09-02 LAB — CHLAMYDIA/NGC RT PCR (ARMC ONLY)
CHLAMYDIA TR: NOT DETECTED
N gonorrhoeae: NOT DETECTED

## 2017-09-02 MED ORDER — METRONIDAZOLE 500 MG PO TABS: 500.0000 mg | ORAL_TABLET | Freq: Two times a day (BID) | ORAL | 0 refills | Status: AC

## 2017-09-02 NOTE — ED Triage Notes (Signed)
Pt in via POV with complaints of a vaginal "cyst" x approximately one week.  Pt reports pain to area, abnormal vaginal discharge.  Pt denies any hx of same.  Vitals WDL, NAD noted at this time.
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
## 2018-08-13 ENCOUNTER — Other Ambulatory Visit: Payer: Self-pay

## 2018-08-13 ENCOUNTER — Emergency Department (HOSPITAL_COMMUNITY)
Admission: EM | Admit: 2018-08-13 | Discharge: 2018-08-13 | Disposition: A | Discharge status: Home / Self Care | Payer: Medicaid Other | Attending: Emergency Medicine | Admitting: Emergency Medicine

## 2018-08-13 ENCOUNTER — Encounter (HOSPITAL_COMMUNITY): Payer: Self-pay

## 2018-08-13 DIAGNOSIS — F17210 Nicotine dependence, cigarettes, uncomplicated: Secondary | ICD-10-CM | POA: Insufficient documentation

## 2018-08-13 DIAGNOSIS — Z79899 Other long term (current) drug therapy: Secondary | ICD-10-CM | POA: Insufficient documentation

## 2018-08-13 DIAGNOSIS — J029 Acute pharyngitis, unspecified: Secondary | ICD-10-CM | POA: Diagnosis present

## 2018-08-13 DIAGNOSIS — J020 Streptococcal pharyngitis: Secondary | ICD-10-CM | POA: Diagnosis not present

## 2018-08-13 HISTORY — DX: Post-traumatic stress disorder, unspecified: F43.10

## 2018-08-13 LAB — GROUP A STREP BY PCR: GROUP A STREP BY PCR: DETECTED — AB

## 2018-08-13 MED ORDER — PENICILLIN G BENZATHINE 1200000 UNIT/2ML IM SUSP
1.2000 10*6.[IU] | Freq: Once | INTRAMUSCULAR | Status: AC
  Administered 2018-08-13: 1.2 10*6.[IU] via INTRAMUSCULAR
  Filled 2018-08-13: qty 2

## 2018-08-13 MED ORDER — MAGIC MOUTHWASH
10.0000 mL | Freq: Once | ORAL | Status: DC
  Filled 2018-08-13: qty 10

## 2018-08-13 MED ORDER — IBUPROFEN 800 MG PO TABS
800.0000 mg | ORAL_TABLET | Freq: Once | ORAL | Status: AC
  Administered 2018-08-13: 800 mg via ORAL
  Filled 2018-08-13: qty 1

## 2018-08-13 MED ORDER — IBUPROFEN 800 MG PO TABS: 800.0000 mg | ORAL_TABLET | Freq: Three times a day (TID) | ORAL | 0 refills | Status: DC | PRN

## 2018-08-13 NOTE — Discharge Instructions (Addendum)
Return here as needed. Follow up with a regular doctor. Increase your fluid intake.
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
## 2018-08-13 NOTE — ED Triage Notes (Signed)
Pt states that she has had a sore throat for about 3 days with fever and chills. Also c/o bumps on hands and vomiting last night.

## 2018-11-13 ENCOUNTER — Emergency Department (HOSPITAL_COMMUNITY)
Admission: EM | Admit: 2018-11-13 | Discharge: 2018-11-13 | Disposition: A | Discharge status: Home / Self Care | Payer: Medicaid Other | Attending: Emergency Medicine | Admitting: Emergency Medicine

## 2018-11-13 ENCOUNTER — Emergency Department (HOSPITAL_COMMUNITY): Payer: Medicaid Other

## 2018-11-13 ENCOUNTER — Other Ambulatory Visit: Payer: Self-pay

## 2018-11-13 DIAGNOSIS — R059 Cough, unspecified: Secondary | ICD-10-CM

## 2018-11-13 DIAGNOSIS — Z79899 Other long term (current) drug therapy: Secondary | ICD-10-CM | POA: Diagnosis not present

## 2018-11-13 DIAGNOSIS — R05 Cough: Secondary | ICD-10-CM | POA: Insufficient documentation

## 2018-11-13 DIAGNOSIS — J029 Acute pharyngitis, unspecified: Secondary | ICD-10-CM | POA: Diagnosis present

## 2018-11-13 DIAGNOSIS — F17210 Nicotine dependence, cigarettes, uncomplicated: Secondary | ICD-10-CM | POA: Diagnosis not present

## 2018-11-13 MED ORDER — BENZONATATE 100 MG PO CAPS
100.0000 mg | ORAL_CAPSULE | Freq: Once | ORAL | Status: AC
  Administered 2018-11-13: 100 mg via ORAL
  Filled 2018-11-13: qty 1

## 2018-11-13 MED ORDER — ONDANSETRON 4 MG PO TBDP: 4.0000 mg | ORAL_TABLET | Freq: Three times a day (TID) | ORAL | 0 refills | Status: DC | PRN

## 2018-11-13 MED ORDER — AZITHROMYCIN 250 MG PO TABS: 250.0000 mg | ORAL_TABLET | Freq: Every day | ORAL | 0 refills | Status: DC

## 2018-11-13 MED ORDER — BENZONATATE 100 MG PO CAPS: 100.0000 mg | ORAL_CAPSULE | Freq: Three times a day (TID) | ORAL | 0 refills | Status: DC | PRN

## 2018-11-13 MED ORDER — ONDANSETRON 4 MG PO TBDP
4.0000 mg | ORAL_TABLET | Freq: Once | ORAL | Status: AC
  Administered 2018-11-13: 4 mg via ORAL
  Filled 2018-11-13: qty 1
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
## 2018-11-13 NOTE — ED Triage Notes (Addendum)
Pt states she has had a cold all week. Endorses initially having a sore throat that subsided and was followed by a worsening cough. Pt coughing during triage to the point of vomiting in trash can. Pt also states she has had diarrhea the past couple of days. Pt is alert and oriented. Denies fevers at home. Taking OTC meds to control sx without success. Marland Kitchen

## 2018-11-13 NOTE — Discharge Instructions (Signed)
It was my pleasure taking care of you today!   Please take all of your antibiotics until finished!   Flonase (over-the-counter nasal spray) can help with nasal congestion, tessalon as needed for cough.  Zofran as needed for nausea. Alternate between Tylenol and ibuprofen as needed for pain.   Rest, drink plenty of fluids to be sure you are staying hydrated.   Please follow up with your primary doctor for discussion of your diagnoses and further evaluation after today's visit if symptoms persist longer than 7 days; Return to the ER for high fevers, difficulty breathing or other concerning symptoms

## 2018-11-22 ENCOUNTER — Emergency Department
Admission: EM | Admit: 2018-11-22 | Discharge: 2018-11-22 | Disposition: A | Discharge status: Home / Self Care | Payer: Medicaid Other | Attending: Emergency Medicine | Admitting: Emergency Medicine

## 2018-11-22 ENCOUNTER — Emergency Department: Payer: Medicaid Other

## 2018-11-22 ENCOUNTER — Encounter: Payer: Self-pay | Admitting: Emergency Medicine

## 2018-11-22 DIAGNOSIS — Z79899 Other long term (current) drug therapy: Secondary | ICD-10-CM | POA: Diagnosis not present

## 2018-11-22 DIAGNOSIS — R0789 Other chest pain: Secondary | ICD-10-CM | POA: Insufficient documentation

## 2018-11-22 DIAGNOSIS — F17210 Nicotine dependence, cigarettes, uncomplicated: Secondary | ICD-10-CM | POA: Diagnosis not present

## 2018-11-22 DIAGNOSIS — M25512 Pain in left shoulder: Secondary | ICD-10-CM

## 2018-11-22 LAB — BASIC METABOLIC PANEL
Anion gap: 9 (ref 5–15)
BUN: 15 mg/dL (ref 6–20)
CHLORIDE: 107 mmol/L (ref 98–111)
CO2: 22 mmol/L (ref 22–32)
Calcium: 9.1 mg/dL (ref 8.9–10.3)
Creatinine, Ser: 0.77 mg/dL (ref 0.44–1.00)
GFR calc non Af Amer: >60 mL/min (ref >60)
Glucose, Bld: 91 mg/dL (ref 70–99)
POTASSIUM: 4.1 mmol/L (ref 3.5–5.1)
SODIUM: 138 mmol/L (ref 135–145)

## 2018-11-22 LAB — CBC WITH DIFFERENTIAL/PLATELET
ABS IMMATURE GRANULOCYTES: 0.02 10*3/uL (ref 0.00–0.07)
BASOS PCT: 1 %
Basophils Absolute: 0 10*3/uL (ref 0.0–0.1)
Eosinophils Absolute: 0.2 10*3/uL (ref 0.0–0.5)
Eosinophils Relative: 3 %
HEMATOCRIT: 39.7 % (ref 36.0–46.0)
HEMOGLOBIN: 12.8 g/dL (ref 12.0–15.0)
Immature Granulocytes: 0 %
LYMPHS ABS: 2.5 10*3/uL (ref 0.7–4.0)
Lymphocytes Relative: 37 %
MCH: 28.5 pg (ref 26.0–34.0)
MCHC: 32.2 g/dL (ref 30.0–36.0)
MCV: 88.4 fL (ref 80.0–100.0)
MONO ABS: 0.4 10*3/uL (ref 0.1–1.0)
MONOS PCT: 7 %
NEUTROS ABS: 3.6 10*3/uL (ref 1.7–7.7)
NEUTROS PCT: 52 %
PLATELETS: 402 10*3/uL — AB (ref 150–400)
RBC: 4.49 MIL/uL (ref 3.87–5.11)
RDW: 13.2 % (ref 11.5–15.5)
WBC: 6.8 10*3/uL (ref 4.0–10.5)
nRBC: 0 % (ref 0.0–0.2)

## 2018-11-22 LAB — TROPONIN I

## 2018-11-22 MED ORDER — KETOROLAC TROMETHAMINE 30 MG/ML IJ SOLN
30.0000 mg | Freq: Once | INTRAMUSCULAR | Status: AC
  Administered 2018-11-22: 30 mg via INTRAVENOUS
  Filled 2018-11-22: qty 1

## 2018-11-22 MED ORDER — IBUPROFEN 600 MG PO TABS: 600.0000 mg | ORAL_TABLET | Freq: Four times a day (QID) | ORAL | 0 refills | Status: DC | PRN

## 2018-11-22 MED ORDER — DIAZEPAM 5 MG PO TABS
5.0000 mg | ORAL_TABLET | Freq: Once | ORAL | Status: AC
  Administered 2018-11-22: 5 mg via ORAL
  Filled 2018-11-22: qty 1

## 2018-11-22 MED ORDER — HYDROCODONE-ACETAMINOPHEN 5-325 MG PO TABS: 1.0000 | ORAL_TABLET | Freq: Four times a day (QID) | ORAL | 0 refills | Status: AC | PRN
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
## 2018-11-22 NOTE — ED Notes (Signed)
This RN called by X-ray department, pt requesting X-rays of her neck due to neck pain despite no injury. MD made aware. DG C-spine ordered. X-ray aware prior to bringing patient back to room.

## 2018-11-22 NOTE — ED Notes (Signed)
Pt returned from X-ray at this time, noted to no longer be crying. This RN informed patient that her mother had called. Pt currently using cell phone in the bed. Will continue to monitor for further patient needs.

## 2018-11-22 NOTE — ED Triage Notes (Signed)
Pt reports awoke 4 days ago with paint to her left shoulder. Pt states the pain has increasingly gotten worse over the last 4 days. Pt states painful to look left and right, or move in any way. Pt states the pain radiates into her chest and neck.

## 2018-11-22 NOTE — ED Notes (Signed)
EDP at bedside at this time. Pt crying at this time. Pt states L shoulder pain that started approx 4-5 days ago. Pt states L shoulder pain that radiates to her neck, chest, and back that is sharp in nature. Pt states pain is worse with position changes. Pt is noted to be hyperventilating at this time. Pt states pain 10/10. Pain worse with palpation. Radial pulse +2, +movement, sensation, moderate grip strength to L hand.

## 2018-11-22 NOTE — Discharge Instructions (Addendum)
Return to the ER for new or worsening pain, weakness or numbness, or any other new or worsening symptoms that concern you.  Follow-up with your regular doctor.

## 2018-11-22 NOTE — ED Notes (Signed)
EDP at bedside at this time.  

## 2019-05-06 ENCOUNTER — Emergency Department (HOSPITAL_COMMUNITY): Payer: Medicaid Other

## 2019-05-06 ENCOUNTER — Emergency Department (HOSPITAL_COMMUNITY)
Admission: EM | Admit: 2019-05-06 | Discharge: 2019-05-06 | Disposition: A | Discharge status: Home / Self Care | Payer: Medicaid Other | Attending: Emergency Medicine | Admitting: Emergency Medicine

## 2019-05-06 ENCOUNTER — Other Ambulatory Visit: Payer: Self-pay

## 2019-05-06 DIAGNOSIS — R112 Nausea with vomiting, unspecified: Secondary | ICD-10-CM | POA: Diagnosis not present

## 2019-05-06 DIAGNOSIS — R519 Headache, unspecified: Secondary | ICD-10-CM

## 2019-05-06 DIAGNOSIS — R51 Headache: Secondary | ICD-10-CM | POA: Insufficient documentation

## 2019-05-06 DIAGNOSIS — F17210 Nicotine dependence, cigarettes, uncomplicated: Secondary | ICD-10-CM | POA: Diagnosis not present

## 2019-05-06 DIAGNOSIS — Z20828 Contact with and (suspected) exposure to other viral communicable diseases: Secondary | ICD-10-CM | POA: Insufficient documentation

## 2019-05-06 DIAGNOSIS — Z79899 Other long term (current) drug therapy: Secondary | ICD-10-CM | POA: Insufficient documentation

## 2019-05-06 DIAGNOSIS — R197 Diarrhea, unspecified: Secondary | ICD-10-CM | POA: Diagnosis not present

## 2019-05-06 DIAGNOSIS — M791 Myalgia, unspecified site: Secondary | ICD-10-CM | POA: Insufficient documentation

## 2019-05-06 DIAGNOSIS — R1013 Epigastric pain: Secondary | ICD-10-CM

## 2019-05-06 DIAGNOSIS — R0602 Shortness of breath: Secondary | ICD-10-CM | POA: Insufficient documentation

## 2019-05-06 DIAGNOSIS — R0789 Other chest pain: Secondary | ICD-10-CM | POA: Insufficient documentation

## 2019-05-06 DIAGNOSIS — R5383 Other fatigue: Secondary | ICD-10-CM | POA: Insufficient documentation

## 2019-05-06 DIAGNOSIS — Z20822 Contact with and (suspected) exposure to covid-19: Secondary | ICD-10-CM

## 2019-05-06 DIAGNOSIS — R6883 Chills (without fever): Secondary | ICD-10-CM | POA: Insufficient documentation

## 2019-05-06 LAB — URINALYSIS, ROUTINE W REFLEX MICROSCOPIC
Bilirubin Urine: NEGATIVE
Glucose, UA: NEGATIVE mg/dL
Ketones, ur: NEGATIVE mg/dL
Leukocytes,Ua: NEGATIVE
Nitrite: NEGATIVE
Protein, ur: 30 mg/dL — AB
Specific Gravity, Urine: 1.009 (ref 1.005–1.030)
pH: 7 (ref 5.0–8.0)

## 2019-05-06 LAB — COMPREHENSIVE METABOLIC PANEL
ALT: 12 U/L (ref 0–44)
AST: 18 U/L (ref 15–41)
Albumin: 3.3 g/dL — ABNORMAL LOW (ref 3.5–5.0)
Alkaline Phosphatase: 48 U/L (ref 38–126)
Anion gap: 7 (ref 5–15)
BUN: 7 mg/dL (ref 6–20)
CO2: 26 mmol/L (ref 22–32)
Calcium: 8.4 mg/dL — ABNORMAL LOW (ref 8.9–10.3)
Chloride: 104 mmol/L (ref 98–111)
Creatinine, Ser: 0.81 mg/dL (ref 0.44–1.00)
GFR calc Af Amer: >60 mL/min (ref >60)
GFR calc non Af Amer: >60 mL/min (ref >60)
Glucose, Bld: 97 mg/dL (ref 70–99)
Potassium: 4 mmol/L (ref 3.5–5.1)
Sodium: 137 mmol/L (ref 135–145)
Total Bilirubin: 0.6 mg/dL (ref 0.3–1.2)
Total Protein: 6.4 g/dL — ABNORMAL LOW (ref 6.5–8.1)

## 2019-05-06 LAB — CBC
HCT: 35 % — ABNORMAL LOW (ref 36.0–46.0)
Hemoglobin: 11.4 g/dL — ABNORMAL LOW (ref 12.0–15.0)
MCH: 29.5 pg (ref 26.0–34.0)
MCHC: 32.6 g/dL (ref 30.0–36.0)
MCV: 90.4 fL (ref 80.0–100.0)
Platelets: 283 10*3/uL (ref 150–400)
RBC: 3.87 MIL/uL (ref 3.87–5.11)
RDW: 13.8 % (ref 11.5–15.5)
WBC: 4.8 10*3/uL (ref 4.0–10.5)
nRBC: 0 % (ref 0.0–0.2)

## 2019-05-06 LAB — LIPASE, BLOOD: Lipase: 23 U/L (ref 11–51)

## 2019-05-06 LAB — I-STAT BETA HCG BLOOD, ED (MC, WL, AP ONLY): I-stat hCG, quantitative: <5 m[IU]/mL (ref <5)

## 2019-05-06 MED ORDER — SODIUM CHLORIDE 0.9% FLUSH: 3.0000 mL | Freq: Once | INTRAVENOUS | Status: DC

## 2019-05-06 MED ORDER — ALUM & MAG HYDROXIDE-SIMETH 200-200-20 MG/5ML PO SUSP
30.0000 mL | Freq: Once | ORAL | Status: AC
  Administered 2019-05-06: 30 mL via ORAL
  Filled 2019-05-06: qty 30

## 2019-05-06 MED ORDER — KETOROLAC TROMETHAMINE 15 MG/ML IJ SOLN
15.0000 mg | Freq: Once | INTRAMUSCULAR | Status: AC
  Administered 2019-05-06: 15 mg via INTRAVENOUS
  Filled 2019-05-06: qty 1

## 2019-05-06 MED ORDER — ONDANSETRON HCL 4 MG/2ML IJ SOLN: 4.0000 mg | Freq: Once | INTRAMUSCULAR | Status: DC

## 2019-05-06 MED ORDER — SODIUM CHLORIDE 0.9 % IV BOLUS
1000.0000 mL | Freq: Once | INTRAVENOUS | Status: AC
  Administered 2019-05-06: 1000 mL via INTRAVENOUS

## 2019-05-06 MED ORDER — ONDANSETRON 4 MG PO TBDP: 4.0000 mg | ORAL_TABLET | Freq: Three times a day (TID) | ORAL | 0 refills | Status: DC | PRN

## 2019-05-06 MED ORDER — ACETAMINOPHEN 500 MG PO TABS
1000.0000 mg | ORAL_TABLET | Freq: Once | ORAL | Status: AC
  Administered 2019-05-06: 1000 mg via ORAL
  Filled 2019-05-06: qty 2

## 2019-05-06 MED ORDER — PROMETHAZINE HCL 25 MG/ML IJ SOLN
25.0000 mg | Freq: Once | INTRAMUSCULAR | Status: AC
  Administered 2019-05-06: 25 mg via INTRAVENOUS
  Filled 2019-05-06: qty 1

## 2019-05-06 MED ORDER — LIDOCAINE VISCOUS HCL 2 % MT SOLN
15.0000 mL | Freq: Once | OROMUCOSAL | Status: AC
  Administered 2019-05-06: 15 mL via ORAL
  Filled 2019-05-06: qty 15

## 2019-05-06 NOTE — Discharge Instructions (Addendum)
Please take Ibuprofen (Advil, motrin) and Tylenol (acetaminophen) to relieve your pain.  You may take up to 600 MG (3 pills) of normal strength ibuprofen every 8 hours as needed.  In between doses of ibuprofen you make take tylenol, up to 1,000 mg (two extra strength pills).  Do not take more than 3,000 mg tylenol in a 24 hour period.  Please check all medication labels as many medications such as pain and cold medications may contain tylenol.  Do not drink alcohol while taking these medications.  Do not take other NSAID'S while taking ibuprofen (such as aleve or naproxen).  Please take ibuprofen with food to decrease stomach upset.  You may also take Prilosec which is available over-the-counter to help with your stomach pain.  I have given you a work note today.  If your work requires you to fill out short-term disability or FMLA paperwork this needs to be done by your primary care doctor.   Please try tylenol and if that doesn't control your symptoms then you may add in ibuprofen.  Please make sure you are staying well hydrated.  Zofran may make you sleepy.   Today you received medications that may make you sleepy or impair your ability to make decisions.  For the next 24 hours please do not drive, operate heavy machinery, care for a small child with out another adult present, or perform any activities that may cause harm to you or someone else if you were to fall asleep or be impaired.   If your symptoms worsen, you are unable to drink enough fluids to stay hydrated, or you have any other concerns please seek additional medical care and evaluation.
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
## 2019-05-06 NOTE — ED Triage Notes (Signed)
Patient reports abdominal cramping onset yesterday, thought it was just her period, but new N/V/D began today. Denies fevers/chills.

## 2019-06-30 ENCOUNTER — Encounter (HOSPITAL_COMMUNITY): Payer: Self-pay | Admitting: Emergency Medicine

## 2019-06-30 ENCOUNTER — Emergency Department (HOSPITAL_COMMUNITY)
Admission: EM | Admit: 2019-06-30 | Discharge: 2019-06-30 | Disposition: A | Discharge status: Home / Self Care | Payer: Medicaid Other | Attending: Emergency Medicine | Admitting: Emergency Medicine

## 2019-06-30 ENCOUNTER — Other Ambulatory Visit: Payer: Self-pay

## 2019-06-30 DIAGNOSIS — R197 Diarrhea, unspecified: Secondary | ICD-10-CM | POA: Diagnosis not present

## 2019-06-30 DIAGNOSIS — R112 Nausea with vomiting, unspecified: Secondary | ICD-10-CM | POA: Insufficient documentation

## 2019-06-30 DIAGNOSIS — R1010 Upper abdominal pain, unspecified: Secondary | ICD-10-CM | POA: Diagnosis present

## 2019-06-30 DIAGNOSIS — Z79899 Other long term (current) drug therapy: Secondary | ICD-10-CM | POA: Insufficient documentation

## 2019-06-30 DIAGNOSIS — F17210 Nicotine dependence, cigarettes, uncomplicated: Secondary | ICD-10-CM | POA: Diagnosis not present

## 2019-06-30 LAB — COMPREHENSIVE METABOLIC PANEL
ALT: 16 U/L (ref 0–44)
AST: 25 U/L (ref 15–41)
Albumin: 3.6 g/dL (ref 3.5–5.0)
Alkaline Phosphatase: 58 U/L (ref 38–126)
Anion gap: 10 (ref 5–15)
BUN: 8 mg/dL (ref 6–20)
CO2: 27 mmol/L (ref 22–32)
Calcium: 8.6 mg/dL — ABNORMAL LOW (ref 8.9–10.3)
Chloride: 101 mmol/L (ref 98–111)
Creatinine, Ser: 0.88 mg/dL (ref 0.44–1.00)
GFR calc Af Amer: >60 mL/min (ref >60)
GFR calc non Af Amer: >60 mL/min (ref >60)
Glucose, Bld: 99 mg/dL (ref 70–99)
Potassium: 3.5 mmol/L (ref 3.5–5.1)
Sodium: 138 mmol/L (ref 135–145)
Total Bilirubin: 0.8 mg/dL (ref 0.3–1.2)
Total Protein: 7 g/dL (ref 6.5–8.1)

## 2019-06-30 LAB — URINALYSIS, ROUTINE W REFLEX MICROSCOPIC
Bilirubin Urine: NEGATIVE
Glucose, UA: NEGATIVE mg/dL
Hgb urine dipstick: NEGATIVE
Ketones, ur: NEGATIVE mg/dL
Leukocytes,Ua: NEGATIVE
Nitrite: NEGATIVE
Protein, ur: NEGATIVE mg/dL
Specific Gravity, Urine: 1.025 (ref 1.005–1.030)
pH: 7 (ref 5.0–8.0)

## 2019-06-30 LAB — CBC
HCT: 38.4 % (ref 36.0–46.0)
Hemoglobin: 12.2 g/dL (ref 12.0–15.0)
MCH: 29.6 pg (ref 26.0–34.0)
MCHC: 31.8 g/dL (ref 30.0–36.0)
MCV: 93.2 fL (ref 80.0–100.0)
Platelets: 261 10*3/uL (ref 150–400)
RBC: 4.12 MIL/uL (ref 3.87–5.11)
RDW: 14.6 % (ref 11.5–15.5)
WBC: 6 10*3/uL (ref 4.0–10.5)
nRBC: 0 % (ref 0.0–0.2)

## 2019-06-30 LAB — I-STAT BETA HCG BLOOD, ED (MC, WL, AP ONLY): I-stat hCG, quantitative: <5 m[IU]/mL (ref <5)

## 2019-06-30 LAB — LIPASE, BLOOD: Lipase: 32 U/L (ref 11–51)

## 2019-06-30 MED ORDER — SODIUM CHLORIDE 0.9% FLUSH: 3.0000 mL | Freq: Once | INTRAVENOUS | Status: DC

## 2019-06-30 NOTE — ED Notes (Signed)
Pt verbalized understanding of discharge and follow up instrucions. pT ALERT AND ORIENTED x4 AND AMBULATORY TO LOBBY WITH STEADY GAIT.

## 2019-06-30 NOTE — ED Triage Notes (Signed)
Pt reports upper abdominal pain, emesis, diarrhea. Denies recent fever, sick contacts at home, VSS.
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
## 2019-08-24 ENCOUNTER — Encounter: Payer: Self-pay | Admitting: Emergency Medicine

## 2019-08-24 ENCOUNTER — Emergency Department
Admission: EM | Admit: 2019-08-24 | Discharge: 2019-08-24 | Disposition: A | Discharge status: Home / Self Care | Payer: Medicaid Other | Attending: Emergency Medicine | Admitting: Emergency Medicine

## 2019-08-24 ENCOUNTER — Other Ambulatory Visit: Payer: Self-pay

## 2019-08-24 DIAGNOSIS — Z79899 Other long term (current) drug therapy: Secondary | ICD-10-CM | POA: Insufficient documentation

## 2019-08-24 DIAGNOSIS — L732 Hidradenitis suppurativa: Secondary | ICD-10-CM | POA: Diagnosis not present

## 2019-08-24 DIAGNOSIS — L0291 Cutaneous abscess, unspecified: Secondary | ICD-10-CM | POA: Diagnosis present

## 2019-08-24 DIAGNOSIS — F17210 Nicotine dependence, cigarettes, uncomplicated: Secondary | ICD-10-CM | POA: Diagnosis not present

## 2019-08-24 MED ORDER — SULFAMETHOXAZOLE-TRIMETHOPRIM 800-160 MG PO TABS: 1.0000 | ORAL_TABLET | Freq: Two times a day (BID) | ORAL | 0 refills | Status: DC

## 2019-08-24 MED ORDER — HYDROCODONE-ACETAMINOPHEN 5-325 MG PO TABS: 1.0000 | ORAL_TABLET | Freq: Four times a day (QID) | ORAL | 0 refills | Status: AC | PRN
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
## 2019-08-24 NOTE — ED Triage Notes (Signed)
Pt presents to ED via POV with c/o "knots" to bilateral axilla and to bilateral breasts. Pt states has been seen for same in the past.

## 2019-08-24 NOTE — ED Notes (Signed)
See triage note  Presents with possible abscess area under arms

## 2019-09-05 ENCOUNTER — Encounter (HOSPITAL_COMMUNITY): Payer: Self-pay | Admitting: Emergency Medicine

## 2019-09-05 ENCOUNTER — Other Ambulatory Visit: Payer: Self-pay

## 2019-09-05 ENCOUNTER — Emergency Department (HOSPITAL_COMMUNITY): Payer: Medicaid Other

## 2019-09-05 ENCOUNTER — Emergency Department (HOSPITAL_COMMUNITY)
Admission: EM | Admit: 2019-09-05 | Discharge: 2019-09-05 | Disposition: A | Discharge status: Home / Self Care | Payer: Medicaid Other | Attending: Emergency Medicine | Admitting: Emergency Medicine

## 2019-09-05 DIAGNOSIS — F17210 Nicotine dependence, cigarettes, uncomplicated: Secondary | ICD-10-CM | POA: Diagnosis not present

## 2019-09-05 DIAGNOSIS — Y929 Unspecified place or not applicable: Secondary | ICD-10-CM | POA: Diagnosis not present

## 2019-09-05 DIAGNOSIS — Y9389 Activity, other specified: Secondary | ICD-10-CM | POA: Insufficient documentation

## 2019-09-05 DIAGNOSIS — S46911A Strain of unspecified muscle, fascia and tendon at shoulder and upper arm level, right arm, initial encounter: Secondary | ICD-10-CM | POA: Insufficient documentation

## 2019-09-05 DIAGNOSIS — X58XXXA Exposure to other specified factors, initial encounter: Secondary | ICD-10-CM | POA: Insufficient documentation

## 2019-09-05 DIAGNOSIS — Y999 Unspecified external cause status: Secondary | ICD-10-CM | POA: Diagnosis not present

## 2019-09-05 DIAGNOSIS — S4991XA Unspecified injury of right shoulder and upper arm, initial encounter: Secondary | ICD-10-CM | POA: Diagnosis present

## 2019-09-05 MED ORDER — HYDROCODONE-ACETAMINOPHEN 5-325 MG PO TABS: 1.0000 | ORAL_TABLET | ORAL | 0 refills | Status: DC | PRN

## 2019-09-05 MED ORDER — IBUPROFEN 400 MG PO TABS
600.0000 mg | ORAL_TABLET | Freq: Once | ORAL | Status: AC
  Administered 2019-09-05: 600 mg via ORAL
  Filled 2019-09-05: qty 1

## 2019-09-05 MED ORDER — HYDROCODONE-ACETAMINOPHEN 5-325 MG PO TABS
1.0000 | ORAL_TABLET | Freq: Once | ORAL | Status: AC
  Administered 2019-09-05: 22:00:00 1 via ORAL
  Filled 2019-09-05: qty 1
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
## 2019-09-05 NOTE — ED Triage Notes (Signed)
Patient reports right shoulder joint pain onset this week , unable to lift right arm due to pain , pain increases with movement/changing positions , denies injury .

## 2019-12-13 ENCOUNTER — Encounter: Payer: Self-pay | Admitting: Radiology

## 2019-12-14 ENCOUNTER — Encounter: Payer: Self-pay | Admitting: Radiology

## 2020-01-18 ENCOUNTER — Other Ambulatory Visit: Payer: Self-pay

## 2020-01-18 ENCOUNTER — Other Ambulatory Visit (HOSPITAL_COMMUNITY)
Admission: RE | Admit: 2020-01-18 | Discharge: 2020-01-18 | Disposition: A | Discharge status: Home / Self Care | Payer: Medicaid Other | Source: Ambulatory Visit | Attending: Obstetrics & Gynecology | Admitting: Obstetrics & Gynecology

## 2020-01-18 ENCOUNTER — Encounter: Payer: Self-pay | Admitting: Obstetrics & Gynecology

## 2020-01-18 ENCOUNTER — Ambulatory Visit (INDEPENDENT_AMBULATORY_CARE_PROVIDER_SITE_OTHER): Payer: Medicaid Other | Admitting: Obstetrics & Gynecology

## 2020-01-18 ENCOUNTER — Encounter: Payer: Self-pay | Admitting: Radiology

## 2020-01-18 VITALS — BP 137/76 | HR 75 | Wt 212.6 lb

## 2020-01-18 DIAGNOSIS — Z01419 Encounter for gynecological examination (general) (routine) without abnormal findings: Secondary | ICD-10-CM | POA: Diagnosis present

## 2020-01-18 DIAGNOSIS — Z01411 Encounter for gynecological examination (general) (routine) with abnormal findings: Secondary | ICD-10-CM

## 2020-01-18 DIAGNOSIS — Z1272 Encounter for screening for malignant neoplasm of vagina: Secondary | ICD-10-CM

## 2020-01-18 DIAGNOSIS — N750 Cyst of Bartholin's gland: Secondary | ICD-10-CM | POA: Diagnosis not present

## 2020-01-18 DIAGNOSIS — N760 Acute vaginitis: Secondary | ICD-10-CM

## 2020-01-18 DIAGNOSIS — B9689 Other specified bacterial agents as the cause of diseases classified elsewhere: Secondary | ICD-10-CM

## 2020-01-18 DIAGNOSIS — N926 Irregular menstruation, unspecified: Secondary | ICD-10-CM

## 2020-01-18 DIAGNOSIS — Z30011 Encounter for initial prescription of contraceptive pills: Secondary | ICD-10-CM

## 2020-01-18 MED ORDER — NORGESTIMATE-ETH ESTRADIOL 0.25-35 MG-MCG PO TABS: 1.0000 | ORAL_TABLET | Freq: Every day | ORAL | 11 refills | Status: DC

## 2020-01-18 NOTE — Patient Instructions (Addendum)
Oral Contraception Use Oral contraceptive pills (OCPs) are medicines that you take to prevent pregnancy. OCPs work by:  Preventing the ovaries from releasing eggs.  Thickening mucus in the lower part of the uterus (cervix), which prevents sperm from entering the uterus.  Thinning the lining of the uterus (endometrium), which prevents a fertilized egg from attaching to the endometrium. OCPs are highly effective when taken exactly as prescribed. However, OCPs do not prevent sexually transmitted infections (STIs). Safe sex practices, such as using condoms while on an OCP, can help prevent STIs. Before taking OCPs, you may have a physical exam, blood test, and Pap test. A Pap test involves taking a sample of cells from your cervix to check for cancer. Discuss with your health care provider the possible side effects of the OCP you may be prescribed. When you start an OCP, be aware that it can take 2-3 months for your body to adjust to changes in hormone levels. How to take oral contraceptive pills Follow instructions from your health care provider about how to start taking your first cycle of OCPs. Your health care provider may recommend that you:  Start the pill on day 1 of your menstrual period. If you start at this time, you will not need any backup form of birth control (contraception), such as condoms.  Start the pill on the first Sunday after your menstrual period or on the day you get your prescription. In these cases, you will need to use backup contraception for the first week.  Start the pill at any time of your cycle. ? If you take the pill within 5 days of the start of your period, you will not need a backup form of contraception. ? If you start at any other time of your menstrual cycle, you will need to use another form of contraception for 7 days. If your OCP is the type called a minipill, it will protect you from pregnancy after taking it for 2 days (48 hours), and you can stop using  backup contraception after that time. After you have started taking OCPs:  If you forget to take 1 pill, take it as soon as you remember. Take the next pill at the regular time.  If you miss 2 or more pills, call your health care provider. Different pills have different instructions for missed doses. Use backup birth control until your next menstrual period starts.  If you use a 28-day pack that contains inactive pills and you miss 1 of the last 7 pills (pills with no hormones), throw away the rest of the non-hormone pills and start a new pill pack. No matter which day you start the OCP, you will always start a new pack on that same day of the week. Have an extra pack of OCPs and a backup contraceptive method available in case you miss some pills or lose your OCP pack. Follow these instructions at home:  Do not use any products that contain nicotine or tobacco, such as cigarettes and e-cigarettes. If you need help quitting, ask your health care provider.  Always use a condom to protect against STIs. OCPs do not protect against STIs.  Use a calendar to mark the days of your menstrual period.  Read the information and directions that came with your OCP. Talk to your health care provider if you have questions. Contact a health care provider if:  You develop nausea and vomiting.  You have abnormal vaginal discharge or bleeding.  You develop a rash.  You miss your menstrual period. Depending on the type of OCP you are taking, this may be a sign of pregnancy. Ask your health care provider for more information.  You are losing your hair.  You need treatment for mood swings or depression.  You get dizzy when taking the OCP.  You develop acne after taking the OCP.  You become pregnant or think you may be pregnant.  You have diarrhea, constipation, and abdominal pain or cramps.  You miss 2 or more pills. Get help right away if:  You develop chest pain.  You develop shortness of  breath.  You have an uncontrolled or severe headache.  You develop numbness or slurred speech.  You develop visual or speech problems.  You develop pain, redness, and swelling in your legs.  You develop weakness or numbness in your arms or legs. Summary  Oral contraceptive pills (OCPs) are medicines that you take to prevent pregnancy.  OCPs do not prevent sexually transmitted infections (STIs). Always use a condom to protect against STIs.  When you start an OCP, be aware that it can take 2-3 months for your body to adjust to changes in hormone levels.  Read all the information and directions that come with your OCP. This information is not intended to replace advice given to you by your health care provider. Make sure you discuss any questions you have with your health care provider. Document Revised: 12/04/2018 Document Reviewed: 09/23/2016 Elsevier Patient Education  2020 [place] Since your cyst is small, not infected, and not causing symptoms, you may not need any treatment. These cysts often go away on their own, with home care such as hot baths or warm compresses.  A Bartholin's cyst is a fluid-filled sac that forms on a Bartholin's gland. Bartholin's glands are small glands in the folds of skin around the vaginal opening (labia). These glands produce a fluid to moisten (lubricate) the outside of the vagina during sex. A cyst that is not large or infected may not cause any problems or require treatment. If the cyst gets infected, it is called a Bartholin's abscess. An abscess may cause symptoms such as pain and swelling and is more likely to require treatment. What are the causes? This condition may be caused by a blocked Bartholin's gland. These glands can become blocked due to natural buildup of fluid and oils. Bacteria inside of the cyst can cause infection. In many cases, the cause is not known. What increases the risk? You may be at increased risk  of developing a Bartholin's cyst or abscess if:  You are of childbearing age.  You have a history of Bartholin's cysts or abscesses.  You have diabetes.  You have an STI (sexually transmitted infection). What are the signs or symptoms? Symptoms may include:  A bulge or lump on the labia, near the lower opening of the vagina.  Discomfort or pain. This may get worse during sex or when walking.  Redness, swelling, or fluid draining from the area. These may be signs of an abscess. How severe your symptoms are depends on the size of your cyst and whether it is infected. Infection causes symptoms to get more severe. How is this diagnosed? This condition may be diagnosed based on:  Your symptoms and medical history.  A physical exam to check for swelling in your vaginal area. You may lie on your back on an exam table and have your feet placed into footrests for the exam.  Blood tests to check for infections.  Removal of a fluid sample from the cyst or abscess (biopsy) for testing. You may work with a health care provider who specializes in [employer] health (gynecologist) for diagnosis and treatment. How is this treated? If your cyst is small, not infected, and not causing symptoms, you may not need any treatment. These cysts often go away on their own, with home care such as hot baths or warm compresses. If you have a large cyst or an abscess, treatment may include:  Antibiotic medicine.  A procedure to drain the fluid inside the cyst or abscess. These procedures involve making an incision in the cyst or abscess so that the fluid drains out, and then one of the following may be done: ? A small, thin tube (catheter) may be placed inside the cyst or abscess so that it does not close and fill up with fluid again (fistulization). The catheter will be removed at a follow-up visit. ? The edges of the incision may be stitched to your skin so that the cyst or abscess stays open  (marsupialization). This allows it to continue to drain and not fill up with fluid again. If you have cysts or abscesses that keep returning (recurring) and have required incision and drainage multiple times, your health care provider may talk with you about surgery to remove the Bartholin's gland. Follow these instructions at home: Medicines  Take over-the-counter and prescription medicines only as told by your health care provider.  If you were prescribed an antibiotic medicine, take it as told by your health care provider. Do not stop taking the antibiotic even if your condition improves. Managing pain and swelling  Try sitz baths to help with pain and swelling. A sitz bath is a warm water bath in which the water only comes up to your hips and should cover your buttocks. You may take sitz baths several times a day.  Apply heat to the affected area as often as needed. Use the heat source that your health care provider recommends, such as a moist heat pack or a heating pad. ? Place a towel between your skin and the heat source. ? Leave the heat on for 20-30 minutes. ? Remove the heat if your skin turns bright red. This is especially important if you are unable to feel pain, heat, or cold. You may have a greater risk of getting burned. General instructions  If your cyst or abscess was drained, follow instructions from your health care provider about how to take care of your wound. Use feminine pads as needed to absorb any drainage.  Do not push on or squeeze your cyst.  Do not have sex until the cyst has gone away or your wound from drainage has healed.  Take these steps to help prevent a Bartholin's cyst from returning, and to prevent other Bartholin's cysts from developing: ? Take a bath or shower once a day. Clean your vaginal area with mild soap and water when you bathe. ? Practice safe sex to prevent STIs. Talk with your health care provider about how to prevent STIs and which forms of  birth control (contraception) may be best for you.  Keep all follow-up visits as told by your health care provider. This is important. Contact a health care provider if:  You have a fever.  You develop redness, swelling, or pain around your cyst.  You have fluid, blood, pus, or a bad smell coming from your cyst.  You have a cyst  that gets larger or comes back. Summary  A Bartholin's cyst is a fluid-filled sac that forms on a Bartholin's gland. These glands are in the folds of skin around the vaginal opening (labia).  If your cyst is small, not infected, and not causing symptoms, you may not need any treatment. These cysts often go away on their own, with home care such as hot baths or warm compresses.  If you have a large cyst or an abscess, your health care provider may perform a procedure to drain the fluid.  If you have cysts or abscesses that keep returning (recurring) and have required incision and drainage multiple times, your health care provider may talk with you about surgery to remove the Bartholin's gland. This information is not intended to replace advice given to you by your health care provider. Make sure you discuss any questions you have with your health care provider. Document Revised: 06/04/2018 Document Reviewed: 05/14/2017 Elsevier Patient Education  2020 [place] 21-60 Years Old, Female Preventive care refers to visits with your health care provider and lifestyle choices that can promote health and wellness. This includes:  A yearly physical exam. This may also be called an annual well check.  Regular dental visits and eye exams.  Immunizations.  Screening for certain conditions.  Healthy lifestyle choices, such as eating a healthy diet, getting regular exercise, not using drugs or products that contain nicotine and tobacco, and limiting alcohol use. What can I expect for my preventive care visit? Physical exam Your health care  provider will check your:  Height and weight. This may be used to calculate body mass index (BMI), which tells if you are at a healthy weight.  Heart rate and blood pressure.  Skin for abnormal spots. Counseling Your health care provider may ask you questions about your:  Alcohol, tobacco, and drug use.  Emotional well-being.  Home and relationship well-being.  Sexual activity.  Eating habits.  Work and work Statistician.  Method of birth control.  Menstrual cycle.  Pregnancy history. What immunizations do I need?  Influenza (flu) vaccine  This is recommended every year. Tetanus, diphtheria, and pertussis (Tdap) vaccine  You may need a Td booster every 10 years. Varicella (chickenpox) vaccine  You may need this if you have not been vaccinated. Human papillomavirus (HPV) vaccine  If recommended by your health care provider, you may need three doses over 6 months. Measles, mumps, and rubella (MMR) vaccine  You may need at least one dose of MMR. You may also need a second dose. Meningococcal conjugate (MenACWY) vaccine  One dose is recommended if you are age 66-21 years and a first-year college student living in a residence hall, or if you have one of several medical conditions. You may also need additional booster doses. Pneumococcal conjugate (PCV13) vaccine  You may need this if you have certain conditions and were not previously vaccinated. Pneumococcal polysaccharide (PPSV23) vaccine  You may need one or two doses if you smoke cigarettes or if you have certain conditions. Hepatitis A vaccine  You may need this if you have certain conditions or if you travel or work in places where you may be exposed to hepatitis A. Hepatitis B vaccine  You may need this if you have certain conditions or if you travel or work in places where you may be exposed to hepatitis B. Haemophilus influenzae type b (Hib) vaccine  You may need this if you have certain  conditions. You may receive  vaccines as individual doses or as more than one vaccine together in one shot (combination vaccines). Talk with your health care provider about the risks and benefits of combination vaccines. What tests do I need?  Blood tests  Lipid and cholesterol levels. These may be checked every 5 years starting at age 81.  Hepatitis C test.  Hepatitis B test. Screening  Diabetes screening. This is done by checking your blood sugar (glucose) after you have not eaten for a while (fasting).  Sexually transmitted disease (STD) testing.  BRCA-related cancer screening. This may be done if you have a family history of breast, ovarian, tubal, or peritoneal cancers.  Pelvic exam and Pap test. This may be done every 3 years starting at age 35. Starting at age 39, this may be done every 5 years if you have a Pap test in combination with an HPV test. Talk with your health care provider about your test results, treatment options, and if necessary, the need for more tests. Follow these instructions at home: Eating and drinking   Eat a diet that includes fresh fruits and vegetables, whole grains, lean protein, and low-fat dairy.  Take vitamin and mineral supplements as recommended by your health care provider.  Do not drink alcohol if: ? Your health care provider tells you not to drink. ? You are pregnant, may be pregnant, or are planning to become pregnant.  If you drink alcohol: ? Limit how much you have to 0-1 drink a day. ? Be aware of how much alcohol is in your drink. In the [place] one drink equals one 12 oz bottle of beer (355 mL), one 5 oz glass of wine (148 mL), or one 1 oz glass of hard liquor (44 mL). Lifestyle  Take daily care of your teeth and gums.  Stay active. Exercise for at least 30 minutes on 5 or more days each week.  Do not use any products that contain nicotine or tobacco, such as cigarettes, e-cigarettes, and chewing tobacco. If you need help  quitting, ask your health care provider.  If you are sexually active, practice safe sex. Use a condom or other form of birth control (contraception) in order to prevent pregnancy and STIs (sexually transmitted infections). If you plan to become pregnant, see your health care provider for a preconception visit. What's next?  Visit your health care provider once a year for a well check visit.  Ask your health care provider how often you should have your eyes and teeth checked.  Stay up to date on all vaccines. This information is not intended to replace advice given to you by your health care provider. Make sure you discuss any questions you have with your health care provider. Document Revised: 04/23/2018 Document Reviewed: 04/23/2018 Elsevier Patient Education  2020 [employer].

## 2020-01-18 NOTE — Progress Notes (Signed)
GYNECOLOGY ANNUAL PREVENTATIVE CARE ENCOUNTER NOTE  History:     Emma Green is a 34 y.o. G42P1001 female here for a routine annual gynecologic exam.  Current complaints: had abnormal menstrual period last month, feels she may have miscarried. Did not take home UPT. This bleeding occurred about 2 weeks after her usual period.  Usually has regular periods, history of irregular menses in the past.  Interested in hormonal contraception.   Denies current abnormal vaginal bleeding, discharge, pelvic pain, problems with intercourse or other gynecologic concerns.    Gynecologic History No LMP recorded. (Menstrual status: Irregular Periods). Contraception: none Last Pap: Unsure. Results were: normal   Obstetric History OB History  Gravida Para Term Preterm AB Living  1 1 1     1   SAB TAB Ectopic Multiple Live Births               # Outcome Date GA Lbr Len/2nd Weight Sex Delivery Anes PTL Lv  1 Term             Past Medical History:  Diagnosis Date  . Anxiety   . Blood transfusion without reported diagnosis   . Cervical cyst   . Chlamydia   . Depression   . Exertional shortness of breath   . AYTKZSWF(093.2)    "weekly" (07/08/2013)  . High cholesterol   . Migraine    "monthly" (07/08/2013)  . MRSA (methicillin resistant staph aureus) culture positive   . Pneumonia 2011  . PTSD (post-traumatic stress disorder)   . Pyelonephritis     Past Surgical History:  Procedure Laterality Date  . CESAREAN SECTION  2008  . TIBIA IM NAIL INSERTION Left 08/26/2013   Procedure: INTRAMEDULLARY (IM) NAIL TIBIAL;  Surgeon: Renette Butters, MD;  Location: [facility];  Service: Orthopedics;  Laterality: Left;    Current Outpatient Medications on File Prior to Visit  Medication Sig Dispense Refill  . Oxcarbazepine (TRILEPTAL) 300 MG tablet Take 300 mg by mouth 2 (two) times daily.    . QUEtiapine (SEROQUEL) 50 MG tablet Take 50 mg by mouth at bedtime.    Marland Kitchen HYDROcodone-acetaminophen  (NORCO/VICODIN) 5-325 MG tablet Take 1 tablet by mouth every 4 (four) hours as needed. (Patient not taking: Reported on 01/18/2020) 10 tablet 0  . ondansetron (ZOFRAN ODT) 4 MG disintegrating tablet Take 1 tablet (4 mg total) by mouth every 8 (eight) hours as needed for nausea or vomiting. (Patient not taking: Reported on 01/18/2020) 10 tablet 0  . sulfamethoxazole-trimethoprim (BACTRIM DS) 800-160 MG tablet Take 1 tablet by mouth 2 (two) times daily. (Patient not taking: Reported on 01/18/2020) 20 tablet 0   No current facility-administered medications on file prior to visit.    Allergies  Allergen Reactions  . Tramadol Itching    Social History:  reports that she has been smoking cigarettes. She has a 2.25 pack-year smoking history. She has never used smokeless tobacco. She reports current alcohol use of about 4.0 standard drinks of alcohol per week. She reports current drug use. Drug: Marijuana.  Family History  Problem Relation Age of Onset  . Diabetes Other   . Hypertension Other   . Cancer Other     The following portions of the patient's history were reviewed and updated as appropriate: allergies, current medications, past family history, past medical history, past social history, past surgical history and problem list.  Review of Systems Pertinent items noted in HPI and remainder of comprehensive ROS otherwise negative.  Physical Exam:  BP 137/76   Pulse 75   Wt 212 lb 9.6 oz (96.4 kg)   BMI 38.89 kg/m  CONSTITUTIONAL: Well-developed, well-nourished female in no acute distress.  HENT:  Normocephalic, atraumatic, External right and left ear normal. Oropharynx is clear and moist EYES: Conjunctivae and EOM are normal. Pupils are equal, round, and reactive to light. No scleral icterus.  NECK: Normal range of motion, supple, no masses.  Normal thyroid.  SKIN: Skin is warm and dry. No rash noted. Not diaphoretic. No erythema. No pallor. MUSCULOSKELETAL: Normal range of motion. No  tenderness.  No cyanosis, clubbing, or edema.  2+ distal pulses. NEUROLOGIC: Alert and oriented to person, place, and time. Normal reflexes, muscle tone coordination.  PSYCHIATRIC: Normal mood and affect. Normal behavior. Normal judgment and thought content. CARDIOVASCULAR: Normal heart rate noted, regular rhythm RESPIRATORY: Clear to auscultation bilaterally. Effort and breath sounds normal, no problems with respiration noted. BREASTS: Symmetric in size. No masses, tenderness, skin changes, nipple drainage, or lymphadenopathy bilaterally. Performed in the presence of a chaperone. ABDOMEN: Soft, no distention noted.  No tenderness, rebound or guarding.  PELVIC: Normal appearing external genitalia but small 1.5 cm right non-erythematous Bartholin's gland cyst palpated and noted. No tenderness.  Normal urethral meatus; normal appearing vaginal mucosa and cervix.  No abnormal discharge noted.  Pap smear obtained.  Normal uterine size, no other palpable masses, no uterine or adnexal tenderness.  Performed in the presence of a chaperone.   Assessment and Plan:    1. Abnormal menstrual period Will check HCG today, follow up results.  - Beta hCG quant (ref lab)  2. Oral contraception initiation OCPs initiated. Will do check in 2 months.  Safe sex recommended. - norgestimate-ethinyl estradiol (ORTHO-CYCLEN) 0.25-35 MG-MCG tablet; Take 1 tablet by mouth daily.  Dispense: 1 Package; Refill: 11  3. Cyst of right Bartholin's gland Since her cyst is small, not infected, and not causing symptoms, no treatment needed. These cysts often go away on their own, with home care such as hot baths or warm compresses and this was recommended for her.  Told to let us know if it gets bigger or more concerning as she may need drainage.  4. Well woman exam with routine gynecological exam - Cytology - PAP( [facility]) - Cervicovaginal ancillary only( [place]) - Hepatitis B surface antigen - Hepatitis C antibody -  RPR - HIV Antibody (routine testing w rflx) Will follow up results of pap smear and STI screen and manage accordingly. Routine preventative health maintenance measures emphasized. Please refer to After Visit Summary for other counseling recommendations.      Jaynie Collins, MD, FACOG Obstetrician & Gynecologist, [facility] for [employer], [facility]

## 2020-01-19 LAB — HEPATITIS B SURFACE ANTIGEN: Hepatitis B Surface Ag: NEGATIVE

## 2020-01-19 LAB — HIV ANTIBODY (ROUTINE TESTING W REFLEX): HIV Screen 4th Generation wRfx: NONREACTIVE

## 2020-01-19 LAB — RPR: RPR Ser Ql: NONREACTIVE

## 2020-01-19 LAB — BETA HCG QUANT (REF LAB): hCG Quant: <1 m[IU]/mL

## 2020-01-19 LAB — HEPATITIS C ANTIBODY: Hep C Virus Ab: <0.1 s/co ratio (ref 0.0–0.9)

## 2020-01-20 LAB — CERVICOVAGINAL ANCILLARY ONLY
Bacterial Vaginitis (gardnerella): POSITIVE — AB
Candida Glabrata: NEGATIVE
Candida Vaginitis: NEGATIVE
Chlamydia: NEGATIVE
Comment: NEGATIVE
Comment: NEGATIVE
Comment: NEGATIVE
Comment: NEGATIVE
Comment: NEGATIVE
Comment: NORMAL
Neisseria Gonorrhea: NEGATIVE
Trichomonas: NEGATIVE

## 2020-01-21 LAB — CYTOLOGY - PAP
Comment: NEGATIVE
Diagnosis: NEGATIVE
High risk HPV: NEGATIVE

## 2020-01-21 MED ORDER — METRONIDAZOLE 500 MG PO TABS: 500.0000 mg | ORAL_TABLET | Freq: Two times a day (BID) | ORAL | 0 refills | Status: DC

## 2020-01-21 NOTE — Addendum Note (Signed)
Addended by: Jaynie Collins A on: 01/21/2020 08:07 AM   Modules accepted: Orders

## 2020-01-25 ENCOUNTER — Other Ambulatory Visit: Payer: Self-pay

## 2020-01-25 ENCOUNTER — Emergency Department
Admission: EM | Admit: 2020-01-25 | Discharge: 2020-01-25 | Disposition: A | Discharge status: Home / Self Care | Payer: Medicaid Other | Attending: Emergency Medicine | Admitting: Emergency Medicine

## 2020-01-25 ENCOUNTER — Emergency Department: Payer: Medicaid Other

## 2020-01-25 ENCOUNTER — Encounter: Payer: Self-pay | Admitting: Emergency Medicine

## 2020-01-25 DIAGNOSIS — Z981 Arthrodesis status: Secondary | ICD-10-CM | POA: Insufficient documentation

## 2020-01-25 DIAGNOSIS — M7122 Synovial cyst of popliteal space [Baker], left knee: Secondary | ICD-10-CM | POA: Diagnosis not present

## 2020-01-25 DIAGNOSIS — F17210 Nicotine dependence, cigarettes, uncomplicated: Secondary | ICD-10-CM | POA: Diagnosis not present

## 2020-01-25 DIAGNOSIS — Z87828 Personal history of other (healed) physical injury and trauma: Secondary | ICD-10-CM | POA: Diagnosis not present

## 2020-01-25 DIAGNOSIS — M25562 Pain in left knee: Secondary | ICD-10-CM | POA: Insufficient documentation

## 2020-01-25 DIAGNOSIS — G5601 Carpal tunnel syndrome, right upper limb: Secondary | ICD-10-CM | POA: Insufficient documentation

## 2020-01-25 DIAGNOSIS — X503XXA Overexertion from repetitive movements, initial encounter: Secondary | ICD-10-CM | POA: Insufficient documentation

## 2020-01-25 DIAGNOSIS — M25531 Pain in right wrist: Secondary | ICD-10-CM | POA: Diagnosis present

## 2020-01-25 MED ORDER — MELOXICAM 15 MG PO TABS: 15.0000 mg | ORAL_TABLET | Freq: Every day | ORAL | 0 refills | Status: DC

## 2020-01-25 NOTE — ED Triage Notes (Signed)
Presents with right wrist pain and left knee pain  States pain started yesterday w/o injury  Pt currently has a brace on wrist  States she has repetitive work  Good pulses  Also states pain to left knee is mainly posterior   Pain increases with trying to lay flat
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
## 2020-02-24 DIAGNOSIS — N200 Calculus of kidney: Secondary | ICD-10-CM

## 2020-02-24 HISTORY — DX: Calculus of kidney: N20.0

## 2020-03-07 ENCOUNTER — Encounter: Payer: Self-pay | Admitting: Emergency Medicine

## 2020-03-07 ENCOUNTER — Other Ambulatory Visit: Payer: Self-pay

## 2020-03-07 ENCOUNTER — Emergency Department
Admission: EM | Admit: 2020-03-07 | Discharge: 2020-03-07 | Disposition: A | Discharge status: Home / Self Care | Payer: Medicaid Other | Attending: Emergency Medicine | Admitting: Emergency Medicine

## 2020-03-07 DIAGNOSIS — F17210 Nicotine dependence, cigarettes, uncomplicated: Secondary | ICD-10-CM | POA: Insufficient documentation

## 2020-03-07 DIAGNOSIS — F1590 Other stimulant use, unspecified, uncomplicated: Secondary | ICD-10-CM | POA: Insufficient documentation

## 2020-03-07 DIAGNOSIS — R21 Rash and other nonspecific skin eruption: Secondary | ICD-10-CM | POA: Diagnosis present

## 2020-03-07 MED ORDER — HYDROXYZINE HCL 50 MG PO TABS: 50.0000 mg | ORAL_TABLET | Freq: Three times a day (TID) | ORAL | 0 refills | Status: DC | PRN

## 2020-03-07 MED ORDER — HYDROXYZINE HCL 50 MG PO TABS
50.0000 mg | ORAL_TABLET | Freq: Once | ORAL | Status: AC
  Administered 2020-03-07: 50 mg via ORAL
  Filled 2020-03-07: qty 1

## 2020-03-07 MED ORDER — METHYLPREDNISOLONE SODIUM SUCC 125 MG IJ SOLR
125.0000 mg | Freq: Once | INTRAMUSCULAR | Status: AC
  Administered 2020-03-07: 125 mg via INTRAMUSCULAR
  Filled 2020-03-07: qty 2

## 2020-03-07 MED ORDER — METHYLPREDNISOLONE 4 MG PO TBPK: ORAL_TABLET | ORAL | 0 refills | Status: DC

## 2020-03-07 NOTE — ED Triage Notes (Signed)
Rash to hands and neck. Unsure source. Itches/burns in nature. No fevers.

## 2020-03-07 NOTE — Discharge Instructions (Addendum)
Follow discharge care instruction.  Start Medrol Dosepak tomorrow morning.
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
## 2020-03-20 ENCOUNTER — Ambulatory Visit: Payer: Medicaid Other | Admitting: Obstetrics & Gynecology

## 2020-03-22 ENCOUNTER — Emergency Department (HOSPITAL_COMMUNITY): Payer: Medicaid Other

## 2020-03-22 ENCOUNTER — Emergency Department (HOSPITAL_COMMUNITY)
Admission: EM | Admit: 2020-03-22 | Discharge: 2020-03-22 | Disposition: A | Discharge status: Home / Self Care | Payer: Medicaid Other | Attending: Emergency Medicine | Admitting: Emergency Medicine

## 2020-03-22 ENCOUNTER — Encounter (HOSPITAL_COMMUNITY): Payer: Self-pay | Admitting: *Deleted

## 2020-03-22 ENCOUNTER — Other Ambulatory Visit: Payer: Self-pay

## 2020-03-22 DIAGNOSIS — N730 Acute parametritis and pelvic cellulitis: Secondary | ICD-10-CM | POA: Diagnosis not present

## 2020-03-22 DIAGNOSIS — R1032 Left lower quadrant pain: Secondary | ICD-10-CM | POA: Diagnosis not present

## 2020-03-22 DIAGNOSIS — R531 Weakness: Secondary | ICD-10-CM | POA: Diagnosis not present

## 2020-03-22 DIAGNOSIS — N939 Abnormal uterine and vaginal bleeding, unspecified: Secondary | ICD-10-CM | POA: Diagnosis not present

## 2020-03-22 DIAGNOSIS — F17210 Nicotine dependence, cigarettes, uncomplicated: Secondary | ICD-10-CM | POA: Diagnosis not present

## 2020-03-22 DIAGNOSIS — F419 Anxiety disorder, unspecified: Secondary | ICD-10-CM | POA: Diagnosis not present

## 2020-03-22 DIAGNOSIS — R519 Headache, unspecified: Secondary | ICD-10-CM | POA: Diagnosis not present

## 2020-03-22 DIAGNOSIS — R197 Diarrhea, unspecified: Secondary | ICD-10-CM | POA: Insufficient documentation

## 2020-03-22 DIAGNOSIS — R0789 Other chest pain: Secondary | ICD-10-CM | POA: Diagnosis present

## 2020-03-22 DIAGNOSIS — K5900 Constipation, unspecified: Secondary | ICD-10-CM | POA: Diagnosis not present

## 2020-03-22 DIAGNOSIS — Z20822 Contact with and (suspected) exposure to covid-19: Secondary | ICD-10-CM | POA: Insufficient documentation

## 2020-03-22 DIAGNOSIS — R1030 Lower abdominal pain, unspecified: Secondary | ICD-10-CM

## 2020-03-22 DIAGNOSIS — F4312 Post-traumatic stress disorder, chronic: Secondary | ICD-10-CM | POA: Diagnosis not present

## 2020-03-22 LAB — CBC
HCT: 33.8 % — ABNORMAL LOW (ref 36.0–46.0)
Hemoglobin: 10.6 g/dL — ABNORMAL LOW (ref 12.0–15.0)
MCH: 27.7 pg (ref 26.0–34.0)
MCHC: 31.4 g/dL (ref 30.0–36.0)
MCV: 88.5 fL (ref 80.0–100.0)
Platelets: 370 10*3/uL (ref 150–400)
RBC: 3.82 MIL/uL — ABNORMAL LOW (ref 3.87–5.11)
RDW: 13.2 % (ref 11.5–15.5)
WBC: 9.4 10*3/uL (ref 4.0–10.5)
nRBC: 0 % (ref 0.0–0.2)

## 2020-03-22 LAB — URINALYSIS, MICROSCOPIC (REFLEX): RBC / HPF: >50 RBC/hpf (ref 0–5)

## 2020-03-22 LAB — COMPREHENSIVE METABOLIC PANEL
ALT: 42 U/L (ref 0–44)
AST: 19 U/L (ref 15–41)
Albumin: 3.3 g/dL — ABNORMAL LOW (ref 3.5–5.0)
Alkaline Phosphatase: 45 U/L (ref 38–126)
Anion gap: 9 (ref 5–15)
BUN: 12 mg/dL (ref 6–20)
CO2: 24 mmol/L (ref 22–32)
Calcium: 8.5 mg/dL — ABNORMAL LOW (ref 8.9–10.3)
Chloride: 104 mmol/L (ref 98–111)
Creatinine, Ser: 0.69 mg/dL (ref 0.44–1.00)
GFR calc Af Amer: >60 mL/min (ref >60)
GFR calc non Af Amer: >60 mL/min (ref >60)
Glucose, Bld: 87 mg/dL (ref 70–99)
Potassium: 3.8 mmol/L (ref 3.5–5.1)
Sodium: 137 mmol/L (ref 135–145)
Total Bilirubin: 0.6 mg/dL (ref 0.3–1.2)
Total Protein: 6.6 g/dL (ref 6.5–8.1)

## 2020-03-22 LAB — URINALYSIS, ROUTINE W REFLEX MICROSCOPIC

## 2020-03-22 LAB — LIPASE, BLOOD: Lipase: 33 U/L (ref 11–51)

## 2020-03-22 LAB — WET PREP, GENITAL
Clue Cells Wet Prep HPF POC: NONE SEEN
Sperm: NONE SEEN
Trich, Wet Prep: NONE SEEN
WBC, Wet Prep HPF POC: NONE SEEN
Yeast Wet Prep HPF POC: NONE SEEN

## 2020-03-22 LAB — TROPONIN I (HIGH SENSITIVITY)
Troponin I (High Sensitivity): 3 ng/L (ref <18)
Troponin I (High Sensitivity): <2 ng/L (ref <18)

## 2020-03-22 LAB — I-STAT BETA HCG BLOOD, ED (MC, WL, AP ONLY): I-stat hCG, quantitative: <5 m[IU]/mL (ref <5)

## 2020-03-22 LAB — HIV ANTIBODY (ROUTINE TESTING W REFLEX): HIV Screen 4th Generation wRfx: NONREACTIVE

## 2020-03-22 LAB — SARS CORONAVIRUS 2 BY RT PCR (HOSPITAL ORDER, PERFORMED IN ~~LOC~~ HOSPITAL LAB): SARS Coronavirus 2: NEGATIVE

## 2020-03-22 MED ORDER — DICYCLOMINE HCL 10 MG PO CAPS
20.0000 mg | ORAL_CAPSULE | Freq: Once | ORAL | Status: AC
  Administered 2020-03-22: 20 mg via ORAL
  Filled 2020-03-22: qty 2

## 2020-03-22 MED ORDER — SODIUM CHLORIDE 0.9% FLUSH: 3.0000 mL | Freq: Once | INTRAVENOUS | Status: DC

## 2020-03-22 MED ORDER — IOHEXOL 300 MG/ML  SOLN
100.0000 mL | Freq: Once | INTRAMUSCULAR | Status: AC | PRN
  Administered 2020-03-22: 100 mL via INTRAVENOUS

## 2020-03-22 MED ORDER — CEFTRIAXONE SODIUM 500 MG IJ SOLR
500.0000 mg | Freq: Once | INTRAMUSCULAR | Status: AC
  Administered 2020-03-22: 500 mg via INTRAMUSCULAR
  Filled 2020-03-22: qty 500

## 2020-03-22 MED ORDER — HYDROCODONE-ACETAMINOPHEN 5-325 MG PO TABS
1.0000 | ORAL_TABLET | Freq: Once | ORAL | Status: AC
  Administered 2020-03-22: 1 via ORAL
  Filled 2020-03-22: qty 1

## 2020-03-22 MED ORDER — LIDOCAINE HCL (PF) 1 % IJ SOLN
1.0000 mL | Freq: Once | INTRAMUSCULAR | Status: AC
  Administered 2020-03-22: 1 mL
  Filled 2020-03-22: qty 5

## 2020-03-22 MED ORDER — MORPHINE SULFATE (PF) 4 MG/ML IV SOLN
4.0000 mg | Freq: Once | INTRAVENOUS | Status: AC
  Administered 2020-03-22: 4 mg via INTRAVENOUS
  Filled 2020-03-22: qty 1

## 2020-03-22 MED ORDER — LACTATED RINGERS IV BOLUS
1000.0000 mL | Freq: Once | INTRAVENOUS | Status: AC
  Administered 2020-03-22: 1000 mL via INTRAVENOUS

## 2020-03-22 MED ORDER — ACETAMINOPHEN 325 MG PO TABS
650.0000 mg | ORAL_TABLET | Freq: Once | ORAL | Status: AC
  Administered 2020-03-22: 650 mg via ORAL
  Filled 2020-03-22: qty 2

## 2020-03-22 MED ORDER — DOXYCYCLINE HYCLATE 100 MG PO CAPS: 100.0000 mg | ORAL_CAPSULE | Freq: Two times a day (BID) | ORAL | 0 refills | Status: AC

## 2020-03-22 MED ORDER — METRONIDAZOLE 500 MG PO TABS: 500.0000 mg | ORAL_TABLET | Freq: Two times a day (BID) | ORAL | 0 refills | Status: AC

## 2020-03-22 MED ORDER — MELOXICAM 15 MG PO TABS
15.0000 mg | ORAL_TABLET | Freq: Every day | ORAL | 0 refills | Status: AC | PRN
Start: 2020-03-22 — End: 2020-04-01

## 2020-03-22 NOTE — ED Notes (Signed)
Patient verbalizes understanding of discharge instructions. Opportunity for questioning and answers were provided. Armband removed by staff, pt discharged from ED ambulatory to home.  

## 2020-03-22 NOTE — ED Triage Notes (Signed)
Pt is here with intermittent upper chest pain, denies cough, fever, sob.  Pt reports headache to right side of head consistent for one week, no vision changes, facial or extremity deficits.  Reports over all abdominal pain with 8 weeks of vaginal bleeding that is heavy with clots and uses 10 pads per day
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
## 2020-03-22 NOTE — Discharge Instructions (Signed)
Do not have any sexual contact until cleared to do so by your OB/GYN or while you are still having bleeding.   Please take Tylenol (acetaminophen) to relieve your pain.  You may take tylenol, up to 1,000 mg (two extra strength pills).  Do not take more than 3,000 mg tylenol in a 24 hour period.  Please check all medication labels as many medications such as pain and cold medications may contain tylenol. Please do not drink alcohol while taking this medication.   I have given you a prescription for Mobic (meloxicam) today.  Mobic is a NSAID medication and you should not take it with other NSAIDs.  Examples of other NSAIDS include motrin, ibuprofen, aleve, naproxen, and Voltaren.  Please monitor your bowel movements for dark, tarry, sticky stools. If you have any bowel movements like this you need to stop taking mobic and call your doctor as this may represent a stomach ulcer from taking NSAIDS.    You may have diarrhea from the antibiotics.  It is very important that you continue to take the antibiotics even if you get diarrhea unless a medical professional tells you that you may stop taking them.  If you stop too early the bacteria you are being treated for will become stronger and you may need different, more powerful antibiotics that have more side effects and worsening diarrhea.  Please stay well hydrated and consider probiotics as they may decrease the severity of your diarrhea.  Please be aware that if you take any hormonal contraception (birth control pills, nexplanon, the ring, etc) that your birth control will not work while you are taking antibiotics and you need to use back up protection as directed on the birth control medication information insert.   Today you received a prescription for metronidazole also known as Flagyl. It is very important that you do not consume any alcohol while taking this medication as it will cause you to become violently ill.  If you have had alcohol in the past 72  hours wait until taking it.

## 2020-03-23 LAB — GC/CHLAMYDIA PROBE AMP (~~LOC~~) NOT AT ARMC
Chlamydia: NEGATIVE
Comment: NEGATIVE
Comment: NORMAL
Neisseria Gonorrhea: NEGATIVE

## 2020-03-23 LAB — RPR
RPR Ser Ql: REACTIVE — AB
RPR Titer: 1:1 {titer}

## 2020-03-27 LAB — T.PALLIDUM AB, TOTAL: T Pallidum Abs: NONREACTIVE

## 2020-04-03 ENCOUNTER — Encounter: Payer: Self-pay | Admitting: *Deleted

## 2020-04-06 ENCOUNTER — Encounter: Payer: Self-pay | Admitting: Obstetrics and Gynecology

## 2020-04-06 ENCOUNTER — Other Ambulatory Visit: Payer: Self-pay

## 2020-04-06 ENCOUNTER — Telehealth (INDEPENDENT_AMBULATORY_CARE_PROVIDER_SITE_OTHER): Payer: Medicaid Other | Admitting: Obstetrics and Gynecology

## 2020-04-06 DIAGNOSIS — N939 Abnormal uterine and vaginal bleeding, unspecified: Secondary | ICD-10-CM

## 2020-04-06 DIAGNOSIS — D509 Iron deficiency anemia, unspecified: Secondary | ICD-10-CM

## 2020-04-06 DIAGNOSIS — R768 Other specified abnormal immunological findings in serum: Secondary | ICD-10-CM | POA: Insufficient documentation

## 2020-04-06 MED ORDER — DOCUSATE SODIUM 100 MG PO CAPS
100.0000 mg | ORAL_CAPSULE | Freq: Two times a day (BID) | ORAL | 1 refills | Status: DC | PRN
Start: 2020-04-06 — End: 2021-09-04

## 2020-04-06 MED ORDER — TRANEXAMIC ACID 650 MG PO TABS: 1300.0000 mg | ORAL_TABLET | Freq: Three times a day (TID) | ORAL | 0 refills | Status: DC

## 2020-04-06 MED ORDER — FERROUS GLUCONATE 324 (38 FE) MG PO TABS
324.0000 mg | ORAL_TABLET | Freq: Every day | ORAL | 0 refills | Status: DC
Start: 2020-04-06 — End: 2021-09-04

## 2020-04-06 NOTE — Progress Notes (Signed)
TELEHEALTH VIRTUAL GYNECOLOGY VISIT ENCOUNTER NOTE  I connected with Emma Green on 04/06/20 at  8:45 AM EDT by telephone at home and verified that I am speaking with the correct person using two identifiers.   I discussed the limitations, risks, security and privacy concerns of performing an evaluation and management service by telephone and the availability of in person appointments. I also discussed with the patient that there may be a patient responsible charge related to this service. The patient expressed understanding and agreed to proceed.  PCP: [facility]  Mental Health: [place]  Chief Complaint: follow up ED visit for pain and AUB  History:  Emma Green is a 34 y.o. G1P1001 with above CC. PMHx significant for BMI 40s, h/o c-section x 1  Patient went to ED on 7/28 for pain and AUB. She had a negative CT scan labs except for slight anemia and she was treated for PID and discharged.   Patient states she's always had irregular periods, which pre-dates her c-section. Prior to being put on sprintec at her annual visit with Dr. Macon Large in late May 2021, she states that her periods were irregular and she would have multiple periods in a month. She states she started the OCPs after she was prescribed them and has been bleeding consistently since then. She ran out of OCPs a few days ago. She has about 4 pads of non saturated pads per day and has sometimes bright and dark blood and has period cramps with the bleeding.   Pap negative 2021 and she has tried depo provera, nuvaring, the patch and mirena in the past w/o success.      Past Medical History:  Diagnosis Date  . Anxiety   . Blood transfusion without reported diagnosis   . Cervical cyst   . Chlamydia   . Depression   . Exertional shortness of breath   . ESPQZRAQ(762.2)    "weekly" (07/08/2013)  . High cholesterol   . Migraine    "monthly" (07/08/2013)  . MRSA (methicillin resistant staph  aureus) culture positive   . Pneumonia 2011  . PTSD (post-traumatic stress disorder)   . Pyelonephritis   . Right nephrolithiasis 02/2020  . UTI (urinary tract infection) 07/08/2013   Past Surgical History:  Procedure Laterality Date  . CESAREAN SECTION  2008  . TIBIA IM NAIL INSERTION Left 08/26/2013   Procedure: INTRAMEDULLARY (IM) NAIL TIBIAL;  Surgeon: Sheral Apley, MD;  Location: [facility] OR;  Service: Orthopedics;  Laterality: Left;   The following portions of the patient's history were reviewed and updated as appropriate: allergies, current medications, past family history, past medical history, past social history, past surgical history and problem list.   Meds: Seroquel Patient on two other mood stabilizers but she is unsure of their name  Review of Systems:  Pertinent items noted in HPI and remainder of comprehensive ROS otherwise negative.  Physical Exam:   General:  Alert, oriented and cooperative.   Mental Status: Normal mood and affect perceived. Normal judgment and thought content.  Physical exam deferred due to nature of the encounter  Labs and Imaging Negative: wet prep, TPA testing (+RPR), hiv, beta hcg, gc/ct  CBC Latest Ref Rng & Units 03/22/2020 06/30/2019 05/06/2019  WBC 4.0 - 10.5 K/uL 9.4 6.0 4.8  Hemoglobin 12.0 - 15.0 g/dL 10.6(L) 12.2 11.4(L)  Hematocrit 36 - 46 % 33.8(L) 38.4 35.0(L)  Platelets 150 - 400 K/uL 370 261 283    No results found  for this or any previous visit (from the past 336 hour(s)). DG Chest 2 View  Result Date: 03/22/2020 CLINICAL DATA:  Chest pain. Additional history provided: Patient reports intermittent chest pain, headache to right side of head consistent for 1 week. EXAM: CHEST - 2 VIEW COMPARISON:  Prior chest radiographs 05/06/2019 and earlier FINDINGS: Heart size within normal limits. There is no appreciable airspace consolidation. No evidence of pleural effusion or pneumothorax. No acute bony abnormality identified. IMPRESSION:  No evidence of active cardiopulmonary disease. Electronically Signed   By: Jackey Loge DO   On: 03/22/2020 08:18   CT Abdomen Pelvis W Contrast  Result Date: 03/22/2020 CLINICAL DATA:  34 year old female with acute abdominal pain. EXAM: CT ABDOMEN AND PELVIS WITH CONTRAST TECHNIQUE: Multidetector CT imaging of the abdomen and pelvis was performed using the standard protocol following bolus administration of intravenous contrast. CONTRAST:  OMNIPAQUE IOHEXOL 300 MG/ML  SOLN COMPARISON:  CT Abdomen and Pelvis 09/10/2014 FINDINGS: Lower chest: Nonspecific reticular pattern of ground-glass opacity in the lower lobes now, might simply be atelectasis from lower lung volumes. Cardiac size at the upper limits of normal. No pericardial or pleural effusion. Hepatobiliary: Negative liver and gallbladder. No bile duct enlargement. Pancreas: Negative. Spleen: Negative. Adrenals/Urinary Tract: Normal adrenal glands. Punctate right nephrolithiasis, scattered in the right upper and lower poles. 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 No hydronephrosis or hydroureter. No perinephric stranding. Unremarkable urinary bladder. Numerous chronic pelvic phleboliths appear stable. Stomach/Bowel: Normal large bowel, with normal retrocecal appendix (series 3, image 61). Normal terminal ileum. No dilated or abnormal small bowel. Unremarkable stomach. Duodenum appears normal. No free air, free fluid, or mesenteric stranding. Vascular/Lymphatic: Major arterial structures are patent and appear normal. Grossly patent portal venous system. No lymphadenopathy. Reproductive: Negative. Other: No pelvic free fluid. Musculoskeletal: Negative. IMPRESSION: 1. Nonspecific lung base ground-glass opacity, might simply be atelectasis but consider also viral/atypical respiratory infection. 2. No acute or inflammatory process identified in the  abdomen or pelvis. Punctate right nephrolithiasis, no obstructive uropathy. Electronically Signed   By: Odessa Fleming M.D.   On: 03/22/2020 19:10      Assessment and Plan:    Patient stable *AUB: I don't see a TSH, PRL or u/s in the system. These were ordered and recommended patient come in for labs before next in person visit. Will do a Lysteda course and not continue the OCPs. Pt told if AUB continues after this to call us and let us know and may have to do oral progestins in the future. Patient believes she is done with childbearing. I told her we can d/w her more about this once I see her back for an in person visit *Anemia: qday iron sent in      I discussed the assessment and treatment plan with the patient. The patient was provided an opportunity to ask questions and all were answered. The patient agreed with the plan and demonstrated an understanding of the instructions.   The patient was advised to call back or seek an in-person evaluation/go to the ED if the symptoms worsen or if the condition fails to improve as anticipated.  I provided 15 minutes of non-face-to-face time during this encounter. The visit was done via a MyChart visit.    [place] Bing, MD [facility] [employer], [facility]

## 2020-04-06 NOTE — Progress Notes (Signed)
I connected with  Emma Green on 04/06/20 at  8:45 AM EDT by telephone and verified that I am speaking with the correct person using two identifiers.   I discussed the limitations, risks, security and privacy concerns of performing an evaluation and management service by telephone and the availability of in person appointments. I also discussed with the patient that there may be a patient responsible charge related to this service. The patient expressed understanding and agreed to proceed.  Scheryl Marten, RN 04/06/2020  9:01 AM

## 2020-04-07 ENCOUNTER — Other Ambulatory Visit: Payer: Medicaid Other

## 2020-04-08 LAB — TSH: TSH: 1.15 u[IU]/mL (ref 0.450–4.500)

## 2020-04-08 LAB — PROLACTIN: Prolactin: 8.6 ng/mL (ref 4.8–23.3)

## 2020-04-13 ENCOUNTER — Ambulatory Visit: Payer: Medicaid Other

## 2020-04-18 ENCOUNTER — Ambulatory Visit: Payer: Medicaid Other | Admitting: Obstetrics and Gynecology

## 2020-05-11 ENCOUNTER — Ambulatory Visit (INDEPENDENT_AMBULATORY_CARE_PROVIDER_SITE_OTHER): Payer: Medicaid Other | Admitting: Obstetrics and Gynecology

## 2020-05-11 ENCOUNTER — Encounter: Payer: Self-pay | Admitting: Obstetrics and Gynecology

## 2020-05-11 ENCOUNTER — Other Ambulatory Visit: Payer: Self-pay

## 2020-05-11 VITALS — BP 120/80 | HR 80 | Wt 211.0 lb

## 2020-05-11 DIAGNOSIS — N921 Excessive and frequent menstruation with irregular cycle: Secondary | ICD-10-CM

## 2020-05-11 DIAGNOSIS — N939 Abnormal uterine and vaginal bleeding, unspecified: Secondary | ICD-10-CM | POA: Diagnosis not present

## 2020-05-11 DIAGNOSIS — Z3169 Encounter for other general counseling and advice on procreation: Secondary | ICD-10-CM

## 2020-05-11 MED ORDER — TRANEXAMIC ACID 650 MG PO TABS: 1300.0000 mg | ORAL_TABLET | Freq: Three times a day (TID) | ORAL | 3 refills | Status: AC
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
## 2020-06-01 ENCOUNTER — Ambulatory Visit: Payer: Medicaid Other | Attending: Obstetrics and Gynecology

## 2020-06-06 ENCOUNTER — Telehealth: Payer: Medicaid Other | Admitting: Obstetrics and Gynecology

## 2020-06-13 ENCOUNTER — Ambulatory Visit
Admission: RE | Admit: 2020-06-13 | Discharge: 2020-06-13 | Disposition: A | Discharge status: Home / Self Care | Payer: Medicaid Other | Source: Ambulatory Visit | Attending: Obstetrics and Gynecology | Admitting: Obstetrics and Gynecology

## 2020-06-13 ENCOUNTER — Other Ambulatory Visit: Payer: Self-pay

## 2020-06-13 DIAGNOSIS — N939 Abnormal uterine and vaginal bleeding, unspecified: Secondary | ICD-10-CM | POA: Insufficient documentation

## 2020-06-27 ENCOUNTER — Other Ambulatory Visit: Payer: Self-pay

## 2020-06-27 ENCOUNTER — Telehealth (INDEPENDENT_AMBULATORY_CARE_PROVIDER_SITE_OTHER): Payer: Medicaid Other | Admitting: Obstetrics and Gynecology

## 2020-06-27 DIAGNOSIS — N921 Excessive and frequent menstruation with irregular cycle: Secondary | ICD-10-CM | POA: Insufficient documentation

## 2020-06-27 MED ORDER — TRANEXAMIC ACID 650 MG PO TABS
1300.0000 mg | ORAL_TABLET | Freq: Three times a day (TID) | ORAL | 2 refills | Status: DC
Start: 2020-06-27 — End: 2022-08-28

## 2020-06-27 NOTE — Progress Notes (Signed)
TELEHEALTH VIRTUAL GYNECOLOGY VISIT ENCOUNTER NOTE  I connected with Emma Green on 06/27/20 at  1:45 PM EDT by telephone at home and verified that I am speaking with the correct person using two identifiers.   I discussed the limitations, risks, security and privacy concerns of performing an evaluation and management service by telephone and the availability of in person appointments. I also discussed with the patient that there may be a patient responsible charge related to this service. The patient expressed understanding and agreed to proceed.  Chief Complaint: follow up ultrasound  History:  Emma Green is a 34 y.o. G1P1001 being evaluated today for above CC. she had an u/s on 10/19 that was normal. That was her LMP and the lysteda worked very well for her and she'd like to continue it [place]     Past Medical History:  Diagnosis Date  . Anxiety   . Blood transfusion without reported diagnosis   . Cervical cyst   . Chlamydia   . Depression   . Exertional shortness of breath   . GDJMEQAS(341.9)    "weekly" (07/08/2013)  . High cholesterol   . Migraine    "monthly" (07/08/2013)  . MRSA (methicillin resistant staph aureus) culture positive   . Pneumonia 2011  . PTSD (post-traumatic stress disorder)   . Pyelonephritis   . Right nephrolithiasis 02/2020  . UTI (urinary tract infection) 07/08/2013   Past Surgical History:  Procedure Laterality Date  . CESAREAN SECTION  2008  . TIBIA IM NAIL INSERTION Left 08/26/2013   Procedure: INTRAMEDULLARY (IM) NAIL TIBIAL;  Surgeon: Sheral Apley, MD;  Location: [facility] OR;  Service: Orthopedics;  Laterality: Left;   The following portions of the patient's history were reviewed and updated as appropriate: allergies, current medications, past family history, past medical history, past social history, past surgical history and problem list.    Review of Systems:  Pertinent items noted in HPI and remainder of comprehensive ROS  otherwise negative.  Physical Exam:   General:  Alert, oriented and cooperative.   Mental Status: Normal mood and affect perceived. Normal judgment and thought content.  Physical exam deferred due to nature of the encounter  Labs and Imaging No results found for this or any previous visit (from the past 336 hour(s)). US PELVIC COMPLETE WITH TRANSVAGINAL  Result Date: 06/13/2020 CLINICAL DATA:  Abnormal uterine bleeding, LMP 06/11/2020 EXAM: TRANSABDOMINAL AND TRANSVAGINAL ULTRASOUND OF PELVIS TECHNIQUE: Both transabdominal and transvaginal ultrasound examinations of the pelvis were performed. Transabdominal technique was performed for global imaging of the pelvis including uterus, ovaries, adnexal regions, and pelvic cul-de-sac. It was necessary to proceed with endovaginal exam following the transabdominal exam to visualize the endometrium and right ovary. COMPARISON:  Abnormal uterine bleeding FINDINGS: Uterus Measurements: 8.1 x 4.0 x 5.0 cm = volume: 83 mL. A defect is seen within the anterior lower uterine segment in keeping with prior Caesarean section. No intrauterine masses are seen. The cervix is unremarkable. Endometrium Thickness: 11 mm.  No focal abnormality visualized. Right ovary Measurements: 4.4 x 2.6 x 2.3 cm = volume: 14 mL. Multiple follicles are seen within the right ovary as well as a 1.4 cm avascular cystic lesion containing echogenic internal tissue most in keeping with a resolving hemorrhagic cyst. No adnexal masses are seen. Left ovary Measurements: 3.2 x 2.2 x 2.1 cm = volume: 8 mL. Normal appearance/no adnexal mass. Other findings No abnormal free fluid. IMPRESSION: Normal pelvic sonogram in Electronically Signed   By: Gloris Ham  Ramiro Harvest MD   On: 06/13/2020 15:34      Assessment and Plan:     1. Menorrhagia with irregular cycle Periods almost qmonth. the lysteda worked very well for her and she'd like to continue it qmonth; she is aware this not birth control.   RTC: 1  year      I discussed the assessment and treatment plan with the patient. The patient was provided an opportunity to ask questions and all were answered. The patient agreed with the plan and demonstrated an understanding of the instructions.   The patient was advised to call back or seek an in-person evaluation/go to the ED if the symptoms worsen or if the condition fails to improve as anticipated.  I provided 15 minutes of non-face-to-face time during this encounter. The visit was done via a MyChart visit.    [place] Bing, MD [facility] [employer], [facility]

## 2020-06-27 NOTE — Progress Notes (Signed)
Follow up, discuss ultrasound results. Bleeding is much better since starting the medication lysteda  I connected with  Madilynne Mullan Zalenski on 06/27/20 at  1:45 PM EDT by telephone and verified that I am speaking with the correct person using two identifiers.   I discussed the limitations, risks, security and privacy concerns of performing an evaluation and management service by telephone and the availability of in person appointments. I also discussed with the patient that there may be a patient responsible charge related to this service. The patient expressed understanding and agreed to proceed.  Scheryl Marten, RN 06/27/2020  1:39 PM

## 2020-06-28 ENCOUNTER — Other Ambulatory Visit (HOSPITAL_COMMUNITY)
Admission: RE | Admit: 2020-06-28 | Discharge: 2020-06-28 | Disposition: A | Discharge status: Home / Self Care | Payer: Medicaid Other | Source: Ambulatory Visit | Attending: Family Medicine | Admitting: Family Medicine

## 2020-06-28 ENCOUNTER — Other Ambulatory Visit: Payer: Self-pay

## 2020-06-28 ENCOUNTER — Ambulatory Visit (INDEPENDENT_AMBULATORY_CARE_PROVIDER_SITE_OTHER): Payer: Medicaid Other

## 2020-06-28 VITALS — BP 131/85 | HR 91

## 2020-06-28 DIAGNOSIS — Z113 Encounter for screening for infections with a predominantly sexual mode of transmission: Secondary | ICD-10-CM

## 2020-06-28 DIAGNOSIS — A549 Gonococcal infection, unspecified: Secondary | ICD-10-CM

## 2020-06-28 NOTE — Progress Notes (Signed)
SUBJECTIVE:  34 y.o. female denies abnormal vaginal bleeding or significant pelvic pain or fever. No UTI symptoms. Denies history of known exposure to STD. Pt request routine screening.  No LMP recorded. (Menstrual status: Irregular Periods).  OBJECTIVE:  She appears well, afebrile. Urine dipstick: not done.  ASSESSMENT:  Vaginal Discharge: N/A  Vaginal Odor: N/A   PLAN:  GC, chlamydia, trichomonas, BVAG, CVAG probe sent to lab. Treatment: To be determined once lab results are received ROV prn if symptoms persist or worsen.

## 2020-07-03 ENCOUNTER — Ambulatory Visit (INDEPENDENT_AMBULATORY_CARE_PROVIDER_SITE_OTHER): Payer: Medicaid Other

## 2020-07-03 ENCOUNTER — Telehealth: Payer: Self-pay | Admitting: *Deleted

## 2020-07-03 ENCOUNTER — Telehealth: Payer: Self-pay

## 2020-07-03 ENCOUNTER — Other Ambulatory Visit: Payer: Self-pay

## 2020-07-03 VITALS — BP 125/83

## 2020-07-03 DIAGNOSIS — A549 Gonococcal infection, unspecified: Secondary | ICD-10-CM | POA: Diagnosis not present

## 2020-07-03 LAB — CERVICOVAGINAL ANCILLARY ONLY
Bacterial Vaginitis (gardnerella): NEGATIVE
Candida Glabrata: NEGATIVE
Candida Vaginitis: POSITIVE — AB
Chlamydia: NEGATIVE
Comment: NEGATIVE
Comment: NEGATIVE
Comment: NEGATIVE
Comment: NEGATIVE
Comment: NEGATIVE
Comment: NORMAL
Neisseria Gonorrhea: POSITIVE — AB
Trichomonas: NEGATIVE

## 2020-07-03 MED ORDER — CEFTRIAXONE SODIUM 500 MG IJ SOLR
500.0000 mg | Freq: Once | INTRAMUSCULAR | Status: AC
Start: 2020-07-03 — End: 2020-07-03
  Administered 2020-07-03: 500 mg via INTRAMUSCULAR

## 2020-07-03 MED ORDER — DOXYCYCLINE HYCLATE 100 MG PO CAPS: 100.0000 mg | ORAL_CAPSULE | Freq: Two times a day (BID) | ORAL | 0 refills | Status: DC

## 2020-07-03 MED ORDER — FLUCONAZOLE 150 MG PO TABS: 150.0000 mg | ORAL_TABLET | Freq: Once | ORAL | 3 refills | Status: AC

## 2020-07-03 NOTE — Telephone Encounter (Signed)
Unable to reach pt at this time, voicemail has not been set up.

## 2020-07-03 NOTE — Addendum Note (Signed)
Addended by: Geanie Berlin on: 07/03/2020 11:26 AM   Modules accepted: Orders

## 2020-07-03 NOTE — Progress Notes (Signed)
Patient was assessed and managed by nursing staff during this encounter. I have reviewed the chart and agree with the documentation and plan. I have also made any necessary editorial changes.  Jaynie Collins, MD 07/03/2020 4:27 PM

## 2020-07-03 NOTE — Progress Notes (Signed)
Attestation of Attending Supervision of clinical support staff: I agree with the care provided to this patient and was available for any consultation.  I have reviewed the CMA's note and chart, and I agree with the management and plan.  Tiera Mensinger [place] Saralee Bolick, MD, MPH, ABFM Attending [facility]ician [facility]- [facility]  

## 2020-07-03 NOTE — Telephone Encounter (Signed)
Pt called regarding her results. Informed of results. Informed pt that we will need to get her in for Rocephin Injection and we will send in diflucan for the yeast infection. Informed that pt will need to inform partner so that they can get tested and treated and to refrain from any unprotected sex for at least 7-10 days after both are treated. Pt verbalizes and understands.

## 2020-07-03 NOTE — Progress Notes (Signed)
Pt presents for ceftriaxone injection. Pt tolerated well. Advised pt to refrain from any unprotected intercourse for at least 7-10 days after both her and partner have received treatment. Pt verbalized understanding.

## 2020-07-03 NOTE — Telephone Encounter (Signed)
-----   Message from Federico Flake, MD sent at 07/03/2020 11:26 AM EST ----- Gonorrhea Positive. Needs treatment with Ceftriaxone 500mg  IM + Doxycycline 100mg  BID for 7 days. I have sent  in the rx for doxycycline.   Patient needs to abstain until partners are treated.  Should abstain for 7 days after completed her own regimen is complete.

## 2020-08-02 ENCOUNTER — Other Ambulatory Visit: Payer: Self-pay

## 2020-08-02 ENCOUNTER — Emergency Department (HOSPITAL_BASED_OUTPATIENT_CLINIC_OR_DEPARTMENT_OTHER): Payer: Medicaid Other

## 2020-08-02 ENCOUNTER — Emergency Department (HOSPITAL_COMMUNITY)
Admission: EM | Admit: 2020-08-02 | Discharge: 2020-08-02 | Disposition: A | Discharge status: Home / Self Care | Payer: Medicaid Other | Attending: Emergency Medicine | Admitting: Emergency Medicine

## 2020-08-02 DIAGNOSIS — L02411 Cutaneous abscess of right axilla: Secondary | ICD-10-CM | POA: Diagnosis present

## 2020-08-02 DIAGNOSIS — L03112 Cellulitis of left axilla: Secondary | ICD-10-CM | POA: Insufficient documentation

## 2020-08-02 DIAGNOSIS — M79609 Pain in unspecified limb: Secondary | ICD-10-CM | POA: Diagnosis not present

## 2020-08-02 DIAGNOSIS — M25862 Other specified joint disorders, left knee: Secondary | ICD-10-CM | POA: Diagnosis not present

## 2020-08-02 DIAGNOSIS — L03119 Cellulitis of unspecified part of limb: Secondary | ICD-10-CM

## 2020-08-02 DIAGNOSIS — M25561 Pain in right knee: Secondary | ICD-10-CM | POA: Insufficient documentation

## 2020-08-02 DIAGNOSIS — L03111 Cellulitis of right axilla: Secondary | ICD-10-CM | POA: Insufficient documentation

## 2020-08-02 DIAGNOSIS — F17210 Nicotine dependence, cigarettes, uncomplicated: Secondary | ICD-10-CM | POA: Insufficient documentation

## 2020-08-02 LAB — POC URINE PREG, ED: Preg Test, Ur: NEGATIVE

## 2020-08-02 MED ORDER — METHOCARBAMOL 500 MG PO TABS: 500.0000 mg | ORAL_TABLET | Freq: Two times a day (BID) | ORAL | 0 refills | Status: DC

## 2020-08-02 MED ORDER — OXYCODONE-ACETAMINOPHEN 5-325 MG PO TABS
1.0000 | ORAL_TABLET | Freq: Once | ORAL | Status: AC
  Administered 2020-08-02: 1 via ORAL
  Filled 2020-08-02: qty 1

## 2020-08-02 MED ORDER — DOXYCYCLINE HYCLATE 100 MG PO CAPS: 100.0000 mg | ORAL_CAPSULE | Freq: Two times a day (BID) | ORAL | 0 refills | Status: DC
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
## 2020-08-02 NOTE — ED Triage Notes (Signed)
Pt also with abscesses in both arm pits

## 2020-08-02 NOTE — ED Triage Notes (Signed)
Pt here with c/o pain behind her right knee , states that she a cyst behind the left knee , pt with c/o pain in both knees today

## 2020-08-02 NOTE — Discharge Instructions (Addendum)
Take the antibiotics to help with your infection. The ultrasound did not show any evidence of an abscess.  However you can continue warm compresses. Your ultrasound of your legs did not show any evidence of a DVT or cyst.  These may have resolved. Continue Tylenol or Motrin as needed for pain. Return to the ER if you start to experience worsening leg swelling, fever, redness, chest pain or shortness of breath.

## 2020-08-02 NOTE — Progress Notes (Signed)
Lower extremity venous study completed.  Preliminary results relayed to [place], [place].   See CV Proc for preliminary results report.   Jean Rosenthal, RDMS

## 2020-08-03 ENCOUNTER — Other Ambulatory Visit: Payer: Self-pay | Admitting: Physician Assistant

## 2020-08-04 MED ORDER — METHYLPREDNISOLONE 4 MG PO TBPK
ORAL_TABLET | ORAL | 0 refills | Status: DC
Start: 2020-08-04 — End: 2021-09-04

## 2020-08-09 ENCOUNTER — Ambulatory Visit: Payer: Medicaid Other

## 2020-09-07 ENCOUNTER — Ambulatory Visit: Payer: Medicaid Other | Admitting: Obstetrics and Gynecology

## 2020-09-21 ENCOUNTER — Encounter: Payer: Self-pay | Admitting: Obstetrics and Gynecology

## 2020-09-21 ENCOUNTER — Ambulatory Visit (INDEPENDENT_AMBULATORY_CARE_PROVIDER_SITE_OTHER): Payer: Medicaid Other | Admitting: Obstetrics and Gynecology

## 2020-09-21 ENCOUNTER — Other Ambulatory Visit (HOSPITAL_COMMUNITY)
Admission: RE | Admit: 2020-09-21 | Discharge: 2020-09-21 | Disposition: A | Discharge status: Home / Self Care | Payer: Medicaid Other | Source: Ambulatory Visit | Attending: Obstetrics and Gynecology | Admitting: Obstetrics and Gynecology

## 2020-09-21 ENCOUNTER — Other Ambulatory Visit: Payer: Self-pay

## 2020-09-21 VITALS — BP 125/81 | HR 90 | Wt 207.0 lb

## 2020-09-21 DIAGNOSIS — N920 Excessive and frequent menstruation with regular cycle: Secondary | ICD-10-CM

## 2020-09-21 DIAGNOSIS — Z8619 Personal history of other infectious and parasitic diseases: Secondary | ICD-10-CM
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
## 2020-09-21 NOTE — Progress Notes (Signed)
Periods are now longer, when she first started lysteda it helped , then they have now gotten  worse Needs TOC as well

## 2020-09-22 LAB — CERVICOVAGINAL ANCILLARY ONLY
Bacterial Vaginitis (gardnerella): POSITIVE — AB
Candida Glabrata: NEGATIVE
Candida Vaginitis: NEGATIVE
Chlamydia: NEGATIVE
Comment: NEGATIVE
Comment: NEGATIVE
Comment: NEGATIVE
Comment: NEGATIVE
Comment: NEGATIVE
Comment: NORMAL
Neisseria Gonorrhea: NEGATIVE
Trichomonas: NEGATIVE

## 2020-09-25 MED ORDER — METRONIDAZOLE 500 MG PO TABS: 500.0000 mg | ORAL_TABLET | Freq: Two times a day (BID) | ORAL | 0 refills | Status: DC

## 2020-09-25 NOTE — Addendum Note (Signed)
Addended by: [place] Bing on: 09/25/2020 12:34 PM   Modules accepted: Orders

## 2020-12-06 ENCOUNTER — Encounter: Payer: Self-pay | Admitting: Emergency Medicine

## 2020-12-06 ENCOUNTER — Emergency Department
Admission: EM | Admit: 2020-12-06 | Discharge: 2020-12-06 | Disposition: A | Discharge status: Home / Self Care | Payer: Medicaid Other | Attending: Emergency Medicine | Admitting: Emergency Medicine

## 2020-12-06 ENCOUNTER — Other Ambulatory Visit: Payer: Self-pay

## 2020-12-06 DIAGNOSIS — H5712 Ocular pain, left eye: Secondary | ICD-10-CM | POA: Diagnosis present

## 2020-12-06 DIAGNOSIS — Z5321 Procedure and treatment not carried out due to patient leaving prior to being seen by health care provider: Secondary | ICD-10-CM | POA: Insufficient documentation

## 2020-12-06 MED ORDER — TETRACAINE HCL 0.5 % OP SOLN
2.0000 [drp] | Freq: Once | OPHTHALMIC | Status: DC
  Filled 2020-12-06: qty 4

## 2020-12-06 MED ORDER — FLUORESCEIN SODIUM 1 MG OP STRP
1.0000 | ORAL_STRIP | Freq: Once | OPHTHALMIC | Status: DC
  Filled 2020-12-06: qty 1

## 2020-12-06 NOTE — ED Notes (Addendum)
Physician at bedside to assess, pt still not in room.

## 2020-12-06 NOTE — ED Triage Notes (Signed)
Pt comes into the ED via POV c/o left eye pain.  Pt has h/o styes in the eye but states she has never had one for more than 1 day. Pt in NAD with even and unlabored respirations.  Pt denies any blurred vision.

## 2020-12-06 NOTE — ED Notes (Signed)
This RN called patients number on file, pt states she left, did not give reason why.

## 2020-12-06 NOTE — ED Notes (Signed)
This RN entered room to place supplies at bedside, pt not in room.

## 2020-12-12 ENCOUNTER — Ambulatory Visit: Payer: Medicaid Other | Admitting: Family Medicine

## 2020-12-12 ENCOUNTER — Encounter: Payer: Self-pay | Admitting: Family Medicine

## 2020-12-12 NOTE — Progress Notes (Signed)
Patient did not keep appointment today. She may call to reschedule.  

## 2020-12-21 ENCOUNTER — Other Ambulatory Visit: Payer: Self-pay

## 2020-12-21 ENCOUNTER — Emergency Department (HOSPITAL_COMMUNITY)
Admission: EM | Admit: 2020-12-21 | Discharge: 2020-12-21 | Disposition: A | Discharge status: Home / Self Care | Payer: No Typology Code available for payment source | Attending: Emergency Medicine | Admitting: Emergency Medicine

## 2020-12-21 DIAGNOSIS — S0501XA Injury of conjunctiva and corneal abrasion without foreign body, right eye, initial encounter: Secondary | ICD-10-CM | POA: Insufficient documentation

## 2020-12-21 DIAGNOSIS — H00016 Hordeolum externum left eye, unspecified eyelid: Secondary | ICD-10-CM | POA: Insufficient documentation

## 2020-12-21 DIAGNOSIS — F17210 Nicotine dependence, cigarettes, uncomplicated: Secondary | ICD-10-CM | POA: Insufficient documentation

## 2020-12-21 DIAGNOSIS — Y9389 Activity, other specified: Secondary | ICD-10-CM | POA: Diagnosis not present

## 2020-12-21 DIAGNOSIS — S0591XA Unspecified injury of right eye and orbit, initial encounter: Secondary | ICD-10-CM | POA: Diagnosis present

## 2020-12-21 DIAGNOSIS — Y990 Civilian activity done for income or pay: Secondary | ICD-10-CM | POA: Insufficient documentation

## 2020-12-21 DIAGNOSIS — X58XXXA Exposure to other specified factors, initial encounter: Secondary | ICD-10-CM | POA: Insufficient documentation

## 2020-12-21 MED ORDER — ERYTHROMYCIN 5 MG/GM OP OINT: TOPICAL_OINTMENT | OPHTHALMIC | 0 refills | Status: DC

## 2020-12-21 MED ORDER — FLUORESCEIN SODIUM 1 MG OP STRP
1.0000 | ORAL_STRIP | Freq: Once | OPHTHALMIC | Status: AC
  Administered 2020-12-21: 1 via OPHTHALMIC
  Filled 2020-12-21: qty 1

## 2020-12-21 MED ORDER — TETRACAINE HCL 0.5 % OP SOLN
2.0000 [drp] | Freq: Once | OPHTHALMIC | Status: AC
  Administered 2020-12-21: 2 [drp] via OPHTHALMIC
  Filled 2020-12-21: qty 4
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
## 2020-12-21 NOTE — ED Triage Notes (Signed)
Pt reports right eye pain and is unsure if something got into it  And states they wash at work for about 15 mins and went to [facility] and then came here. Pt states she does not wear contact. Pt denies blurr vision

## 2020-12-21 NOTE — ED Notes (Signed)
Pt ambulated to restroom at this time.
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
## 2020-12-21 NOTE — Discharge Instructions (Addendum)
You came to the emerge department today to be evaluated for your right eye pain.  Your physical exam was consistent with a corneal abrasion.  I have given you a prescription for antibiotic eye ointment.  Please apply ointment 4 times a day for the next 5 days.  Please follow-up with the ophthalmologist Dr. Genia Del within the next 7 days.  Get help right away if: You have severe eye pain that does not get better with medicine. You have vision loss.

## 2021-02-07 ENCOUNTER — Other Ambulatory Visit (HOSPITAL_COMMUNITY)
Admission: RE | Admit: 2021-02-07 | Discharge: 2021-02-07 | Disposition: A | Discharge status: Home / Self Care | Payer: Medicaid Other | Source: Ambulatory Visit | Attending: Family Medicine | Admitting: Family Medicine

## 2021-02-07 ENCOUNTER — Other Ambulatory Visit: Payer: Self-pay

## 2021-02-07 ENCOUNTER — Encounter: Payer: Self-pay | Admitting: Radiology

## 2021-02-07 ENCOUNTER — Ambulatory Visit (INDEPENDENT_AMBULATORY_CARE_PROVIDER_SITE_OTHER): Payer: Medicaid Other

## 2021-02-07 VITALS — BP 120/85 | HR 85

## 2021-02-07 DIAGNOSIS — N898 Other specified noninflammatory disorders of vagina: Secondary | ICD-10-CM | POA: Diagnosis present

## 2021-02-07 DIAGNOSIS — Z113 Encounter for screening for infections with a predominantly sexual mode of transmission: Secondary | ICD-10-CM | POA: Diagnosis present

## 2021-02-07 NOTE — Progress Notes (Signed)
SUBJECTIVE:  35 y.o. female complains of white vaginal discharge for 3 day(s), burning and itching.  Denies abnormal vaginal bleeding or significant pelvic pain or fever. No UTI symptoms. Denies history of known exposure to STD.  No LMP recorded. (Menstrual status: Irregular Periods).  OBJECTIVE:  She appears well, afebrile. Urine dipstick: not done.  ASSESSMENT:  Vaginal Discharge  Vaginal Itching    PLAN:  GC, chlamydia, trichomonas, BVAG, CVAG probe sent to lab. Treatment: To be determined once lab results are received ROV prn if symptoms persist or worsen.

## 2021-02-07 NOTE — Progress Notes (Signed)
Attestation of Attending Supervision of clinical support staff: I agree with the care provided to this patient and was available for any consultation.  I have reviewed the CMA's note and chart, and I agree with the management and plan.  Chante Mayson MD MPH, ABFM Attending Physician Faculty Practice- [facility]  

## 2021-02-08 LAB — CERVICOVAGINAL ANCILLARY ONLY
Bacterial Vaginitis (gardnerella): NEGATIVE
Candida Glabrata: NEGATIVE
Candida Vaginitis: NEGATIVE
Chlamydia: NEGATIVE
Comment: NEGATIVE
Comment: NEGATIVE
Comment: NEGATIVE
Comment: NEGATIVE
Comment: NEGATIVE
Comment: NORMAL
Neisseria Gonorrhea: NEGATIVE
Trichomonas: NEGATIVE

## 2021-04-09 ENCOUNTER — Other Ambulatory Visit: Payer: Self-pay

## 2021-04-09 ENCOUNTER — Encounter (HOSPITAL_COMMUNITY): Payer: Self-pay | Admitting: Emergency Medicine

## 2021-04-09 ENCOUNTER — Ambulatory Visit (HOSPITAL_COMMUNITY)
Admission: EM | Admit: 2021-04-09 | Discharge: 2021-04-09 | Disposition: A | Discharge status: Home / Self Care | Payer: Medicaid Other | Attending: Emergency Medicine | Admitting: Emergency Medicine

## 2021-04-09 DIAGNOSIS — K047 Periapical abscess without sinus: Secondary | ICD-10-CM | POA: Diagnosis not present

## 2021-04-09 DIAGNOSIS — K029 Dental caries, unspecified: Secondary | ICD-10-CM

## 2021-04-09 DIAGNOSIS — L732 Hidradenitis suppurativa: Secondary | ICD-10-CM | POA: Diagnosis not present

## 2021-04-09 MED ORDER — CLINDAMYCIN HCL 300 MG PO CAPS: 300.0000 mg | ORAL_CAPSULE | Freq: Three times a day (TID) | ORAL | 0 refills | Status: AC

## 2021-04-09 MED ORDER — HYDROCODONE-ACETAMINOPHEN 5-325 MG PO TABS
1.0000 | ORAL_TABLET | ORAL | 0 refills | Status: DC | PRN
Start: 2021-04-09 — End: 2021-09-04
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
## 2021-04-09 NOTE — Discharge Instructions (Addendum)
Take the Clindamycin three times a day for the next 7 days.   You can take the Hydrocodone-Acetaminophen as needed for pain.  It can make you sleepy so do not take it prior to driving.  Follow up with a dentist as soon as possible for possible tooth extraction.    Apply a warm compress to abscesses 3-4 times a day.  Follow up with your primary care provider for re-evaluation and long term management of HS.

## 2021-04-09 NOTE — ED Triage Notes (Signed)
Pt presents with multiple abscesses and dental pain.

## 2021-04-20 ENCOUNTER — Emergency Department (HOSPITAL_COMMUNITY)
Admission: EM | Admit: 2021-04-20 | Discharge: 2021-04-20 | Disposition: A | Discharge status: Home / Self Care | Payer: Medicaid Other | Attending: Emergency Medicine | Admitting: Emergency Medicine

## 2021-04-20 ENCOUNTER — Other Ambulatory Visit: Payer: Self-pay

## 2021-04-20 DIAGNOSIS — K0889 Other specified disorders of teeth and supporting structures: Secondary | ICD-10-CM | POA: Insufficient documentation

## 2021-04-20 DIAGNOSIS — F17210 Nicotine dependence, cigarettes, uncomplicated: Secondary | ICD-10-CM | POA: Insufficient documentation

## 2021-04-20 MED ORDER — LIDOCAINE VISCOUS HCL 2 % MT SOLN: 15.0000 mL | OROMUCOSAL | 0 refills | Status: DC | PRN

## 2021-04-20 MED ORDER — HYDROCODONE-ACETAMINOPHEN 5-325 MG PO TABS
1.0000 | ORAL_TABLET | Freq: Once | ORAL | Status: AC
  Administered 2021-04-20: 1 via ORAL
  Filled 2021-04-20: qty 1

## 2021-04-20 MED ORDER — NAPROXEN 500 MG PO TABS: 500.0000 mg | ORAL_TABLET | Freq: Two times a day (BID) | ORAL | 0 refills | Status: DC
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
## 2021-04-20 NOTE — Discharge Instructions (Addendum)
Take the prescribed medication as directed. Follow-up with your dentist on Tuesday as scheduled. Return to the ED for new or worsening symptoms.

## 2021-04-20 NOTE — ED Triage Notes (Signed)
Pt to ED with c/o lower left dental pain, states she cracked her tooth about 2 weeks ago and is unable to get in to see dentist until Tuesday. Rates pain 9/10, worse with touch, no draining.

## 2021-07-26 ENCOUNTER — Other Ambulatory Visit (HOSPITAL_COMMUNITY)
Admission: RE | Admit: 2021-07-26 | Discharge: 2021-07-26 | Disposition: A | Discharge status: Home / Self Care | Payer: Medicaid Other | Source: Ambulatory Visit | Attending: Obstetrics and Gynecology | Admitting: Obstetrics and Gynecology

## 2021-07-26 ENCOUNTER — Other Ambulatory Visit: Payer: Self-pay

## 2021-07-26 ENCOUNTER — Ambulatory Visit (INDEPENDENT_AMBULATORY_CARE_PROVIDER_SITE_OTHER): Payer: Medicaid Other

## 2021-07-26 VITALS — BP 116/81 | HR 80

## 2021-07-26 DIAGNOSIS — N898 Other specified noninflammatory disorders of vagina: Secondary | ICD-10-CM

## 2021-07-26 NOTE — Progress Notes (Signed)
SUBJECTIVE:  35 y.o. female complains of vaginal discharge Denies abnormal vaginal bleeding or significant pelvic pain or fever. No UTI symptoms. Denies history of known exposure to STD.  No LMP recorded. (Menstrual status: Irregular Periods).  OBJECTIVE:  She appears well, afebrile. Urine dipstick: not done.  ASSESSMENT:  Vaginal Discharge  Vaginal Odor   PLAN:  GC, chlamydia, trichomonas, BVAG, CVAG probe sent to lab. Treatment: To be determined once lab results are received ROV prn if symptoms persist or worsen.

## 2021-07-27 LAB — CERVICOVAGINAL ANCILLARY ONLY
Bacterial Vaginitis (gardnerella): NEGATIVE
Candida Glabrata: NEGATIVE
Candida Vaginitis: NEGATIVE
Chlamydia: NEGATIVE
Comment: NEGATIVE
Comment: NEGATIVE
Comment: NEGATIVE
Comment: NEGATIVE
Comment: NEGATIVE
Comment: NORMAL
Neisseria Gonorrhea: NEGATIVE
Trichomonas: NEGATIVE

## 2021-08-05 NOTE — Progress Notes (Signed)
Patient was assessed and managed by nursing staff during this encounter. I have reviewed the chart and agree with the documentation and plan. I have also made any necessary editorial changes.  [place] Bing, MD 08/05/2021 9:08 PM

## 2021-09-04 ENCOUNTER — Other Ambulatory Visit: Payer: Self-pay

## 2021-09-04 ENCOUNTER — Ambulatory Visit (INDEPENDENT_AMBULATORY_CARE_PROVIDER_SITE_OTHER): Payer: Medicaid Other | Admitting: Obstetrics & Gynecology

## 2021-09-04 ENCOUNTER — Encounter: Payer: Self-pay | Admitting: Obstetrics & Gynecology

## 2021-09-04 VITALS — BP 137/93 | HR 80 | Ht 62.0 in | Wt 222.0 lb

## 2021-09-04 DIAGNOSIS — N750 Cyst of Bartholin's gland: Secondary | ICD-10-CM

## 2021-09-04 MED ORDER — SULFAMETHOXAZOLE-TRIMETHOPRIM 800-160 MG PO TABS: 1.0000 | ORAL_TABLET | Freq: Two times a day (BID) | ORAL | 1 refills | Status: DC

## 2021-09-04 NOTE — Progress Notes (Signed)
GYNECOLOGY OFFICE VISIT NOTE  History:   SERRITA DEACY is a 36 y.o. G1P1001 here today for management of recurrent right Bartholin's cyst gland. This is her second episode. Complains of having some pain, but not as bad as last time. She denies any abnormal vaginal discharge, bleeding, pelvic pain or other concerns. This was supposed her annual exam, but she only wants a breast examination done, no need for pap smear.    Past Medical History:  Diagnosis Date   Anxiety    Blood transfusion without reported diagnosis    Cervical cyst    Chlamydia    Depression    Exertional shortness of breath    Headache(784.0)    "weekly" (07/08/2013)   High cholesterol    Migraine    "monthly" (07/08/2013)   MRSA (methicillin resistant staph aureus) culture positive    Pneumonia 2011   PTSD (post-traumatic stress disorder)    Pyelonephritis    Right nephrolithiasis 02/2020   UTI (urinary tract infection) 07/08/2013    Past Surgical History:  Procedure Laterality Date   CESAREAN SECTION  2008   TIBIA IM NAIL INSERTION Left 08/26/2013   Procedure: INTRAMEDULLARY (IM) NAIL TIBIAL;  Surgeon: Renette Butters, MD;  Location: [facility];  Service: Orthopedics;  Laterality: Left;    The following portions of the patient's history were reviewed and updated as appropriate: allergies, current medications, past family history, past medical history, past social history, past surgical history and problem list.   Health Maintenance:  Normal pap and negative HRHPV on 01/18/2020.  Review of Systems:  Pertinent items noted in HPI and remainder of comprehensive ROS otherwise negative.  Physical Exam:  BP (!) 137/93    Pulse 80    Ht 5\' 2"  (1.575 m)    Wt 222 lb (100.7 kg)    BMI 40.60 kg/m  CONSTITUTIONAL: Well-developed, well-nourished female in no acute distress.  HENT:  Normocephalic, atraumatic, External right and left ear normal. Oropharynx is clear and moist EYES: Conjunctivae and EOM are normal.  Pupils are equal, round, and reactive to light. No scleral icterus.  NECK: Normal range of motion, supple, no masses.  Normal thyroid.  SKIN: Skin is warm and dry. No rash noted. Not diaphoretic. No erythema. No pallor. MUSCULOSKELETAL: Normal range of motion. No tenderness.  No cyanosis, clubbing, or edema.  2+ distal pulses. NEUROLOGIC: Alert and oriented to person, place, and time. Normal reflexes, muscle tone coordination.  PSYCHIATRIC: Normal mood and affect. Normal behavior. Normal judgment and thought content. CARDIOVASCULAR: Normal heart rate noted, regular rhythm RESPIRATORY: Clear to auscultation bilaterally. Effort and breath sounds normal, no problems with respiration noted. BREASTS: Symmetric in size. No masses, tenderness, skin changes, nipple drainage, or lymphadenopathy bilaterally. Chaperone present during this examination.  ABDOMEN: Soft, no distention noted.  No tenderness, rebound or guarding.  PELVIC: Enlarged right Bartholin's gland cyst noted, about 3 cm in size, fluctuant. No erythema, mild TTP.  Otherwise, normal appearing external genitalia and urethral meatus; normal appearing distal vaginal mucosa.  No abnormal discharge noted. Chaperone present during this examination.    Bartholin Cyst I&D and Word Catheter Placement Enlarged abscess palpated in front of the hymenal ring around 7 o' clock.  Written informed consent was obtained.  Discussed complications and possible outcomes of procedure including recurrence of cyst, scarring leading to infection, bleeding, dyspareunia, distortion of anatomy.  Patient was examined in the dorsal lithotomy position and mass was identified.  The area was prepped with Iodine and draped  in a sterile manner. 1% Lidocaine (3 ml) was then used to infiltrate area on top of the cyst, behind the hymenal ring.  A 7 mm incision was made using a sterile scapel. Upon palpation of the mass, a moderate amount of clear gelatinous drainage was expressed  through the incision.  The open cyst was then copiously irrigated with normal saline, and a Word catheter was placed. 1.5 ml of sterile water was used to inflate the catheter balloon.  The end of the catheter was tucked into the vagina.  Patient tolerated the procedure well, reported feeling " a lot better."     Assessment and Plan:    1. Cyst of right Bartholin's gland She is s/p I&D and Word catheter placement - Bactrim DS bid x 7 days for treatment - Recommended Sitz baths bid and Motrin and Tylenol prn pain.    - She was told to call to be examined if she experiences increasing swelling, pain, vaginal discharge, or fever.   - She was instructed to wear a peripad to absorb discharge, and to maintain pelvic rest while the Word catheter is in place.  -The catheter will be left in place for at least two to four weeks to promote formation of an epithelialized tract for permanent drainage of glandular secretions.  Routine preventative health maintenance measures emphasized, up to date on pap smear and had normal breast exam. Please refer to After Visit Summary for other counseling recommendations.   Return in about 2 weeks (around 09/18/2021) for Followup Bartholin's gland cyst.    I spent 30 minutes dedicated to the care of this patient including pre-visit review of records, face to face time with the patient discussing her conditions and treatments, performing procedure and post visit orders.    Verita Schneiders, MD, [place] for [employer], [place]

## 2021-09-17 ENCOUNTER — Other Ambulatory Visit: Payer: Self-pay

## 2021-09-17 ENCOUNTER — Ambulatory Visit (HOSPITAL_COMMUNITY)
Admission: EM | Admit: 2021-09-17 | Discharge: 2021-09-17 | Disposition: A | Discharge status: Home / Self Care | Payer: Medicaid Other | Attending: Family Medicine | Admitting: Family Medicine

## 2021-09-17 DIAGNOSIS — N750 Cyst of Bartholin's gland: Secondary | ICD-10-CM

## 2021-09-17 MED ORDER — SULFAMETHOXAZOLE-TRIMETHOPRIM 800-160 MG PO TABS: 1.0000 | ORAL_TABLET | Freq: Two times a day (BID) | ORAL | 0 refills | Status: AC

## 2021-09-17 MED ORDER — TRAMADOL HCL 50 MG PO TABS: 50.0000 mg | ORAL_TABLET | Freq: Four times a day (QID) | ORAL | 0 refills | Status: AC | PRN
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
## 2021-09-17 NOTE — Discharge Instructions (Signed)
You were seen today for a bartholin gland cyst pain.  There is some fullness to the area that was opened and drained, but still appears to be draining slightly.  I have sent out bactrim for another 7 days.  I have given you a short supply of tramadol per your request for pain.  I do recommend you continue sitz baths.  Please follow up with your ob/gyn as planned on Wednesday.

## 2021-09-17 NOTE — ED Triage Notes (Signed)
Pt has a foley bulb to drain her cyst vaginal cyst. The bulb came out over the weekend and patient is in pain. Pt was told to come to urgent care for follow up care.

## 2021-09-19 ENCOUNTER — Encounter: Payer: Self-pay | Admitting: Family Medicine

## 2021-09-19 ENCOUNTER — Ambulatory Visit: Payer: Medicaid Other | Admitting: Family Medicine

## 2021-09-19 NOTE — Progress Notes (Signed)
Patient did not keep appointment today. She may call to reschedule.  

## 2021-09-24 ENCOUNTER — Ambulatory Visit: Payer: Medicaid Other | Admitting: Family Medicine

## 2021-10-01 ENCOUNTER — Emergency Department (HOSPITAL_COMMUNITY): Payer: Medicaid Other

## 2021-10-01 ENCOUNTER — Emergency Department (HOSPITAL_COMMUNITY)
Admission: EM | Admit: 2021-10-01 | Discharge: 2021-10-01 | Disposition: A | Discharge status: Home / Self Care | Payer: Medicaid Other | Attending: Emergency Medicine | Admitting: Emergency Medicine

## 2021-10-01 ENCOUNTER — Encounter (HOSPITAL_COMMUNITY): Payer: Self-pay

## 2021-10-01 DIAGNOSIS — M542 Cervicalgia: Secondary | ICD-10-CM | POA: Diagnosis present

## 2021-10-01 DIAGNOSIS — M62838 Other muscle spasm: Secondary | ICD-10-CM

## 2021-10-01 MED ORDER — CYCLOBENZAPRINE HCL 10 MG PO TABS: 10.0000 mg | ORAL_TABLET | Freq: Three times a day (TID) | ORAL | 0 refills | Status: DC | PRN

## 2021-10-01 MED ORDER — LIDOCAINE 5 % EX PTCH: 1.0000 | MEDICATED_PATCH | CUTANEOUS | 0 refills | Status: DC

## 2021-10-01 MED ORDER — NAPROXEN 375 MG PO TABS: 375.0000 mg | ORAL_TABLET | Freq: Two times a day (BID) | ORAL | 0 refills | Status: DC

## 2021-10-01 MED ORDER — KETOROLAC TROMETHAMINE 30 MG/ML IJ SOLN
30.0000 mg | Freq: Once | INTRAMUSCULAR | Status: AC
  Administered 2021-10-01: 30 mg via INTRAMUSCULAR
  Filled 2021-10-01: qty 1

## 2021-10-01 MED ORDER — METHYLPREDNISOLONE 4 MG PO TBPK: ORAL_TABLET | ORAL | 0 refills | Status: DC

## 2021-10-01 NOTE — ED Notes (Signed)
Patient transported to CT 

## 2021-10-01 NOTE — ED Provider Triage Note (Signed)
Emergency Medicine Provider Triage Evaluation Note  Emma Green , a 36 y.o. female  was evaluated in triage.  Pt complains of neck pain and bilateral hand numbness for several days.  Symptoms initially started with left lateral neck pain and radiation to the throat, no sore throat or difficulty swallowing.  Review of Systems  Positive: Left-sided neck pain, left shoulder pain, hand numbness Negative: Fever, chills, recent injury or illness, chest pain, shortness of breath, gait changes, urinary sx  Physical Exam  BP (!) 132/101 (BP Location: Right Arm)    Pulse 92    Temp 99.2 F (37.3 C) (Oral)    Resp 14    Ht 5\' 2"  (1.575 m)    Wt 100 kg    SpO2 99%    BMI 40.32 kg/m  Gen:   Awake, no distress   Resp:  Normal effort  MSK:   Moves extremities without difficulty  Other:  Normal grip strength, decreased sensation on L hand compared to right  Medical Decision Making  Medically screening exam initiated at 11:12 AM.  Appropriate orders placed.  Senie Lanese Green was informed that the remainder of the evaluation will be completed by another provider, this initial triage assessment does not replace that evaluation, and the importance of remaining in the ED until their evaluation is complete.     Glada Wickstrom T, PA-C 10/01/21 1113

## 2021-10-01 NOTE — ED Notes (Signed)
Pt provided something to drink.

## 2021-10-01 NOTE — ED Triage Notes (Signed)
Pt reports L sided lateral neck pain w/ radiation to throat x 2-3 days. States no swelling in throat, but "knot" in neck. Endorses blt hand numbness for same amount of time.

## 2021-10-01 NOTE — Discharge Instructions (Addendum)
If you develop worsening, recurrent, or continued neck pain, numbness or weakness in the arms or legs, incontinence of your bowels or bladders, numbness of your buttocks, fever, headache, or any other new/concerning symptoms then return to the ER for evaluation.   You are being prescribed Naproxen. Do not take Ibuprofen/Advil/Aleve/Motrin/Goody Powders/BC powders/Meloxicam/Diclofenac/Indomethacin and other Nonsteroidal anti-inflammatory medications

## 2021-10-01 NOTE — ED Provider Notes (Addendum)
[facility] EMERGENCY DEPARTMENT Provider Note   CSN: [id number] Arrival date & time: 10/01/21  1054     History  Chief Complaint  Patient presents with   Neck Pain    Emma Green is a 36 y.o. female.  HPI 36 year old female presents with left-sided neck pain.  Started sometime last week.  Pain is primarily to the lateral side of her left posterior neck and into her trapezius and around her scapula.  It does not radiate.  There is no chest pain currently.  She has not had a fever.  Since yesterday she has noticed intermittent hand numbness to both hands but it seems to alternate.  Currently it is in her left hand.  Does not go past the wrist.  No weakness in the arm but it is severely painful to move it. No incontinence. No trauma.   She has taken some leftover tramadol as well as a muscle relaxer this started with an oh but is not helping.  She tried some topical over-the-counter medicine that did not help. When asked, she also notes a sore throat without cough for about 2 days.  Home Medications Prior to Admission medications   Medication Sig Start Date End Date Taking? Authorizing Provider  cyclobenzaprine (FLEXERIL) 10 MG tablet Take 1 tablet (10 mg total) by mouth 3 (three) times daily as needed for muscle spasms. 10/01/21  Yes Pricilla Loveless, MD  lidocaine (LIDODERM) 5 % Place 1 patch onto the skin daily. Remove & Discard patch within 12 hours or as directed by MD 10/01/21  Yes Pricilla Loveless, MD  methylPREDNISolone (MEDROL DOSEPAK) 4 MG TBPK tablet Take as instructed 10/01/21  Yes Pricilla Loveless, MD  naproxen (NAPROSYN) 375 MG tablet Take 1 tablet (375 mg total) by mouth 2 (two) times daily. 10/01/21  Yes Pricilla Loveless, MD  Oxcarbazepine (TRILEPTAL) 300 MG tablet Take 300 mg by mouth 2 (two) times daily.    [provider]  QUEtiapine (SEROQUEL) 50 MG tablet Take 50 mg by mouth at bedtime.    [provider]  tranexamic acid (LYSTEDA) 650  MG TABS tablet Take 2 tablets (1,300 mg total) by mouth 3 (three) times daily. Take during menses for a maximum of five days 06/27/20   Coolidge Bing, MD      Allergies    Tramadol    Review of Systems   Review of Systems  Constitutional:  Negative for fever.  HENT:  Positive for sore throat.   Musculoskeletal:  Positive for neck pain. Negative for neck stiffness.  Neurological:  Positive for numbness. Negative for weakness.   Physical Exam Updated Vital Signs BP 130/88    Pulse 79    Temp 99 F (37.2 C) (Oral)    Resp 16    Ht 5\' 2"  (1.575 m)    Wt 100 kg    SpO2 98%    BMI 40.32 kg/m  Physical Exam Vitals and nursing note reviewed.  Constitutional:      Appearance: She is well-developed. She is obese. She is not ill-appearing or diaphoretic.  HENT:     Head: Normocephalic and atraumatic.     Mouth/Throat:     Pharynx: Oropharynx is clear.  Neck:   Cardiovascular:     Rate and Rhythm: Normal rate and regular rhythm.     Pulses:          Radial pulses are 2+ on the left side.     Heart sounds: Normal heart sounds.  Pulmonary:     Effort: Pulmonary effort is normal.     Breath sounds: Normal breath sounds.  Abdominal:     General: There is no distension.  Musculoskeletal:     Cervical back: Tenderness present. Muscular tenderness present. No spinous process tenderness.       Back:  Skin:    General: Skin is warm and dry.  Neurological:     Mental Status: She is alert.     Comments: 5/5 strength in both upper extremities.  The left side seems to induce pain but she has normal strength. Grossly normal sensation throughout the left upper extremity and right upper extremity save for the left finger and thumb have subjectively decreased sensation.    ED Results / Procedures / Treatments   Labs (all labs ordered are listed, but only abnormal results are displayed) Labs Reviewed - No data to display  EKG None  Radiology CT Cervical Spine Wo Contrast  Result  Date: 10/01/2021 CLINICAL DATA:  Cervical radiculopathy, no red flags EXAM: CT CERVICAL SPINE WITHOUT CONTRAST TECHNIQUE: Multidetector CT imaging of the cervical spine was performed without intravenous contrast. Multiplanar CT image reconstructions were also generated. RADIATION DOSE REDUCTION: This exam was performed according to the departmental dose-optimization program which includes automated exposure control, adjustment of the mA and/or kV according to patient size and/or use of iterative reconstruction technique. COMPARISON:  Cervical spine radiographs November 12, 2018. CT of the cervical spine July 01, 2016. FINDINGS: Alignment: Straightening.  No substantial sagittal subluxation. Skull base and vertebrae: Vertebral body heights are maintained. No evidence of acute fracture Soft tissues and spinal canal: No prevertebral fluid or swelling. No visible canal hematoma. Disc levels: Degenerative disease greatest at C5-C6. Multilevel facet arthropathy. Upper chest: Visualized lung apices are clear. IMPRESSION: No evidence of acute fracture or traumatic malalignment. Electronically Signed   By: Feliberto Harts M.D.   On: 10/01/2021 13:32    Procedures Procedures    Medications Ordered in ED Medications  ketorolac (TORADOL) 30 MG/ML injection 30 mg (30 mg Intramuscular Given 10/01/21 1323)    ED Course/ Medical Decision Making/ A&P                           Medical Decision Making Risk Prescription drug management.   Patient's primary problem appears to be lateral neck/trapezius pain. Low concern for acute spinal cord emergency or infection such as meningitis. She has some vague thumb/finger numbness, but it's localized. Otherwise has benign neuro exam. CT scan from triage shows no acute emergent findings. I have personally reviewed images and see no obvious fracture, subluxation or destruction. I don't think emergent MRI is needed. Given the numbness, will try steroids. Will give NSAIDs, topical  meds and muscle relaxers as Rx. Otherwise no labs are indicated. No indication for admission. D/c home with return precautions.   Of note, she also complained of mild sore throat. No neck stiffness. I doubt deep space infection.  I offered COVID testing but she declines.      Final Clinical Impression(s) / ED Diagnoses Final diagnoses:  Muscle spasms of neck    Rx / DC Orders ED Discharge Orders          Ordered    cyclobenzaprine (FLEXERIL) 10 MG tablet  3 times daily PRN        10/01/21 1551    methylPREDNISolone (MEDROL DOSEPAK) 4 MG TBPK tablet        10/01/21 1551  naproxen (NAPROSYN) 375 MG tablet  2 times daily        10/01/21 1551    lidocaine (LIDODERM) 5 %  Every 24 hours        10/01/21 1551              Pricilla Loveless, MD 10/01/21 1739    Pricilla Loveless, MD 10/02/21 [phone]

## 2022-03-05 ENCOUNTER — Other Ambulatory Visit: Payer: Self-pay

## 2022-03-05 ENCOUNTER — Telehealth: Payer: Self-pay

## 2022-03-05 DIAGNOSIS — N898 Other specified noninflammatory disorders of vagina: Secondary | ICD-10-CM

## 2022-03-05 MED ORDER — FLUCONAZOLE 150 MG PO TABS: 150.0000 mg | ORAL_TABLET | Freq: Once | ORAL | 3 refills | Status: AC

## 2022-03-05 NOTE — Telephone Encounter (Signed)
TC to pt to make aware Rx was sent for yeast as requested with refills. Pt not ava not able to leave a voice mail.  If sx's return 7-10 days after treatment pt needs appt for vaginal swab.

## 2022-03-05 NOTE — Progress Notes (Signed)
Pt called and requested Rx for yeast. Rx sent per protocol.

## 2022-06-12 IMAGING — CT CT CERVICAL SPINE W/O CM
3 of 4 series · 13 of 33 positions shown, 16 images · non-contrast
Comparison: Cervical spine radiographs November 12, 2018. CT of the
cervical spine July 01, 2016.

CLINICAL DATA: Cervical radiculopathy, no red flags



[Series 5: c_spine 2.0 st · axial · 0.31mm/px · z∈[+1235,+1361]mm · 5 of 95 slices shown, 7 images]
[im 16/95  soft-tissue]
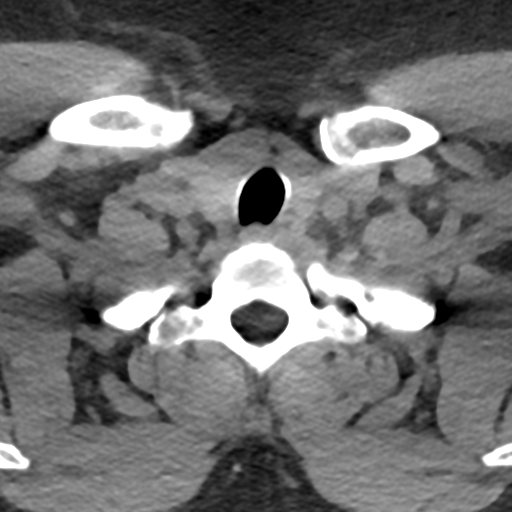
[im 16/95  bone]
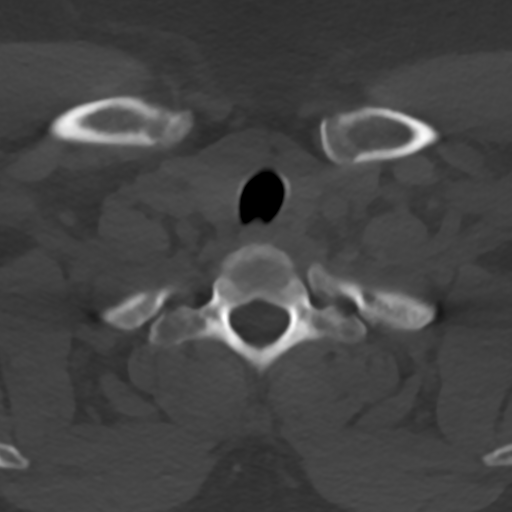
[im 32/95  bone]
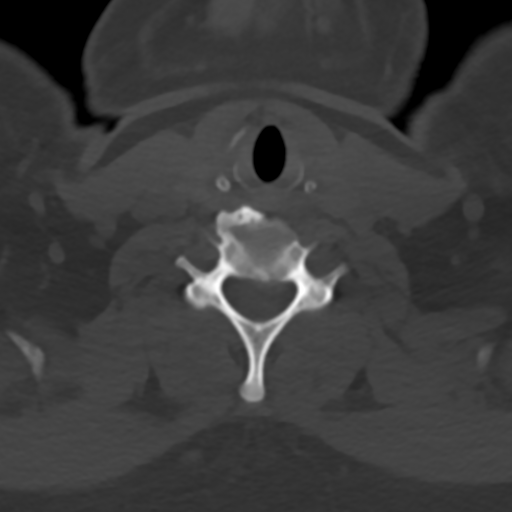
[im 48/95  bone]
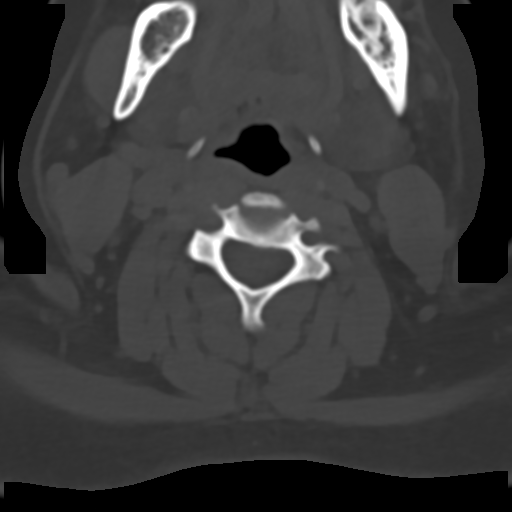
[im 63/95  bone]
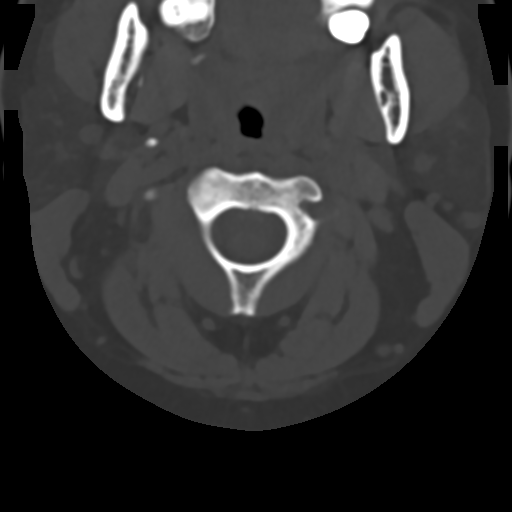
[im 79/95  soft-tissue]
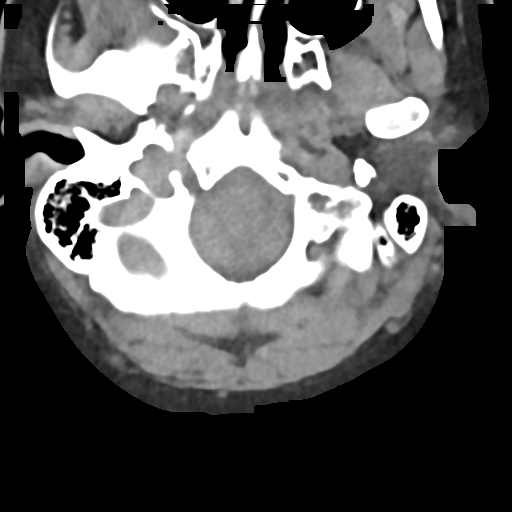
[im 79/95  bone]
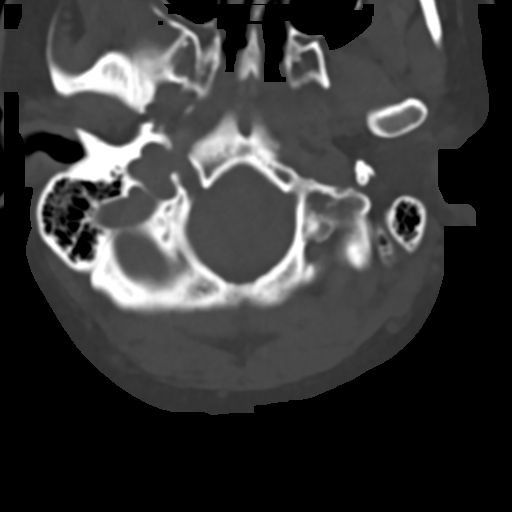

[Series 7: c_spine 2.0 sag bone · sagittal · 0.33mm/px · 5 of 63 slices shown, 6 images]
[im 21/63  bone]
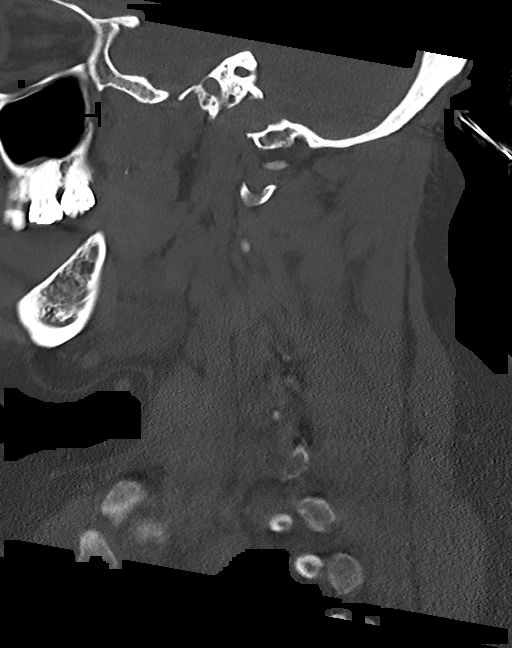
[im 26/63  bone]
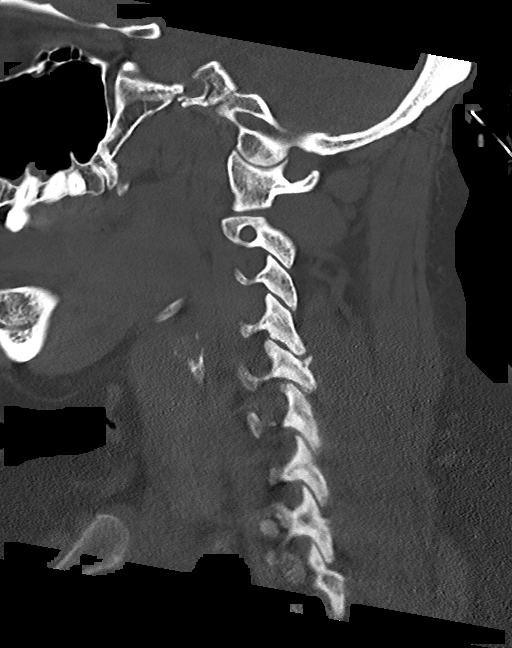
[im 32/63  soft-tissue]
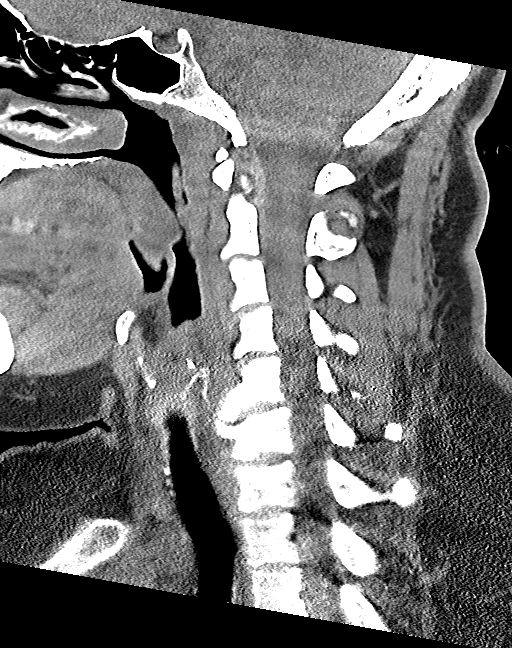
[im 32/63  bone]
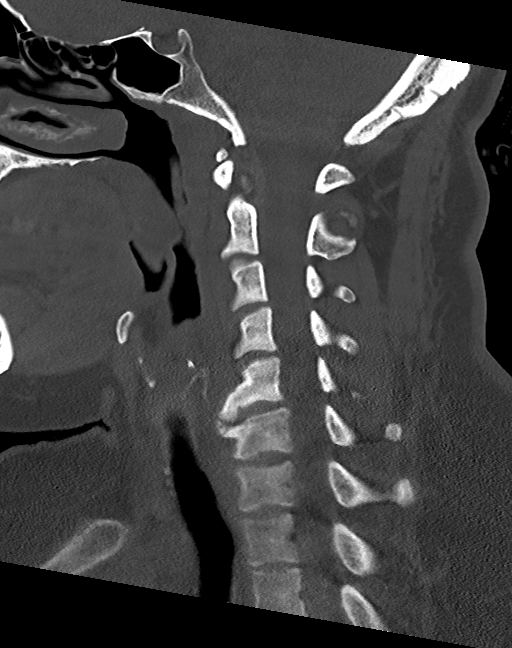
[im 37/63  bone]
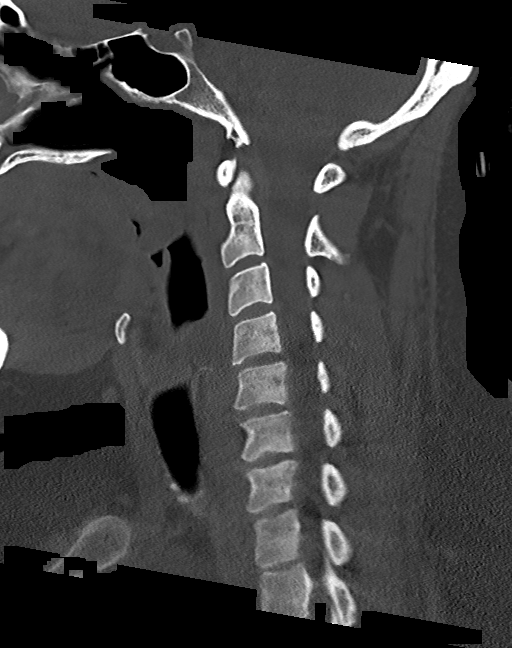
[im 42/63  bone]
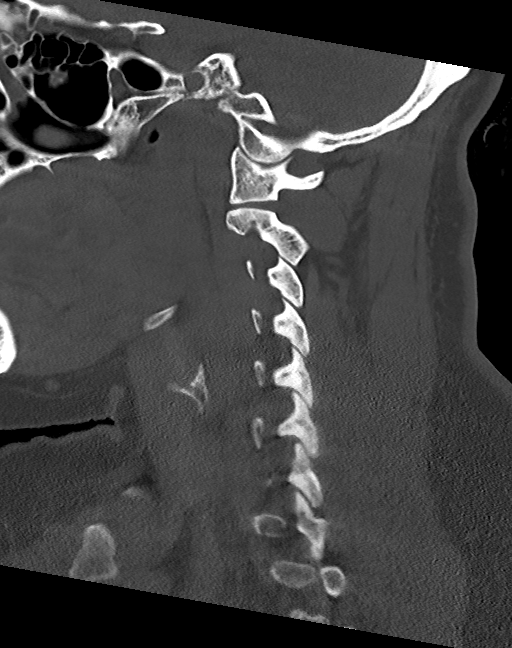

[Series 8: c_spine 2.0 cor bone · coronal · 0.28mm/px · 3 of 72 slices shown]
[im 15/72  bone]
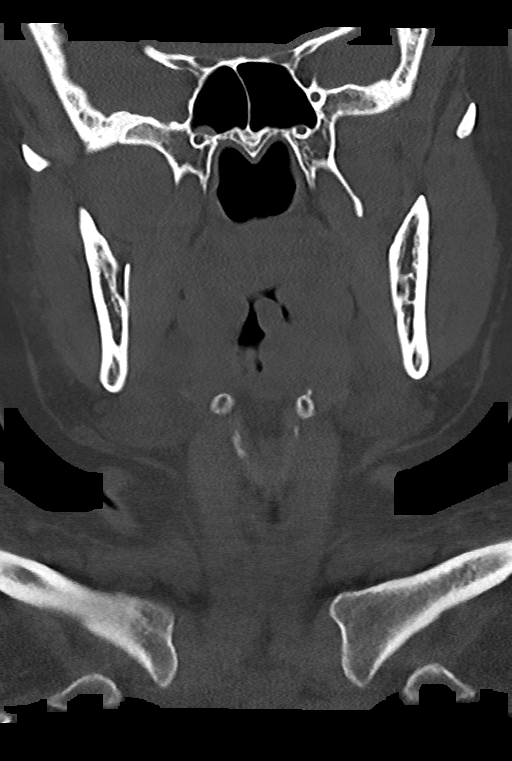
[im 29/72  bone]
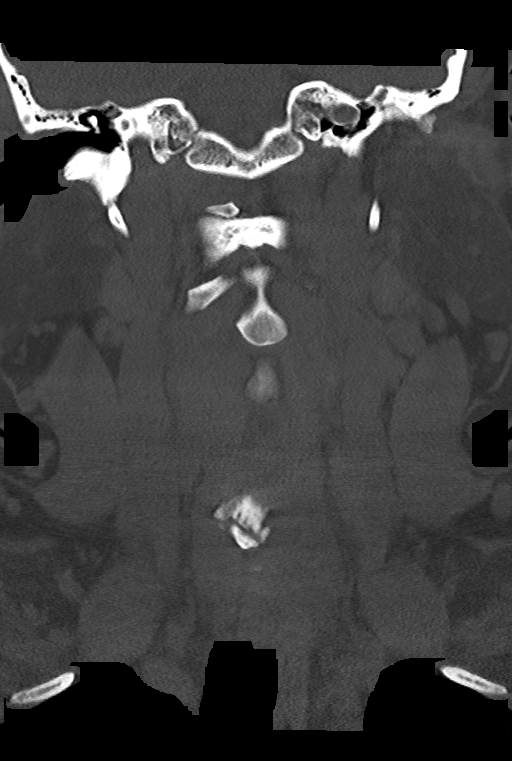
[im 43/72  bone]
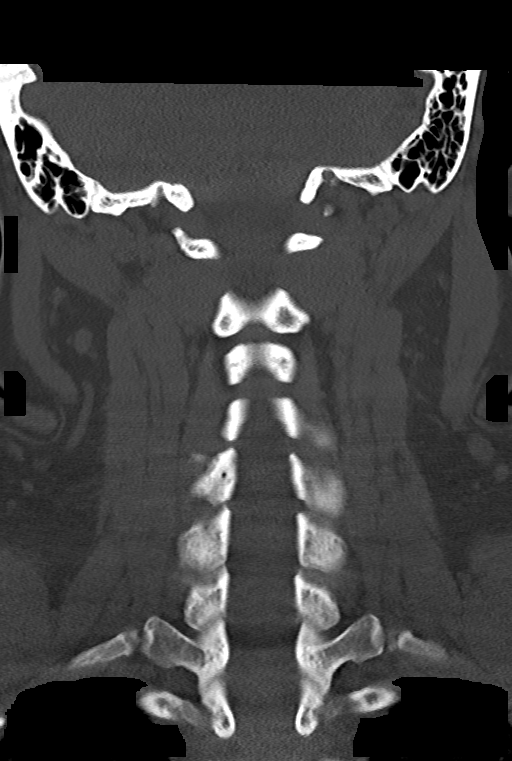

[13 of 33 positions shown; findings below may reference images not displayed]

FINDINGS: Alignment: Straightening.  No substantial sagittal subluxation.

Skull base and vertebrae: Vertebral body heights are maintained. No
evidence of acute fracture

Soft tissues and spinal canal: No prevertebral fluid or swelling. No
visible canal hematoma.

Disc levels: Degenerative disease greatest at C5-C6. Multilevel
facet arthropathy.

Upper chest: Visualized lung apices are clear.
IMPRESSION: No evidence of acute fracture or traumatic malalignment.

## 2022-08-28 ENCOUNTER — Ambulatory Visit: Payer: Self-pay

## 2022-08-28 ENCOUNTER — Ambulatory Visit (INDEPENDENT_AMBULATORY_CARE_PROVIDER_SITE_OTHER): Payer: Medicaid Other | Admitting: Family Medicine

## 2022-08-28 VITALS — BP 170/110 | HR 72

## 2022-08-28 DIAGNOSIS — O3680X0 Pregnancy with inconclusive fetal viability, not applicable or unspecified: Secondary | ICD-10-CM

## 2022-08-28 DIAGNOSIS — N912 Amenorrhea, unspecified: Secondary | ICD-10-CM

## 2022-08-28 DIAGNOSIS — I10 Essential (primary) hypertension: Secondary | ICD-10-CM

## 2022-08-28 HISTORY — DX: Essential (primary) hypertension: I10

## 2022-08-28 LAB — POCT URINE PREGNANCY: Preg Test, Ur: NEGATIVE

## 2022-08-28 MED ORDER — NIFEDIPINE ER OSMOTIC RELEASE 60 MG PO TB24: 60.0000 mg | ORAL_TABLET | Freq: Every day | ORAL | 2 refills | Status: DC

## 2022-08-28 NOTE — Assessment & Plan Note (Addendum)
Has several prior BPs that are elevated. Has PCP @ [facility]--begin ProcardiaXL 60 mg daily--f/u with PCP. DASH diet given

## 2022-08-28 NOTE — Progress Notes (Signed)
   Subjective:    Patient ID: Emma Green is a 37 y.o. female presenting with new OB interview  on 08/28/2022  HPI: Here for New OB sca. UPT negative. No blood able to be drawn. Has h/o elevated BP. BP here today is very high. Reports headache. LMP was 05/2022. Has h/o skipping cycles.  Review of Systems  Constitutional:  Negative for chills and fever.  Respiratory:  Negative for shortness of breath.   Cardiovascular:  Negative for chest pain.  Gastrointestinal:  Negative for abdominal pain, nausea and vomiting.  Genitourinary:  Negative for dysuria.  Skin:  Negative for rash.      Objective:    BP (!) 170/110   Pulse 72   LMP 06/09/2022   Breastfeeding Unknown  Physical Exam Exam conducted with a chaperone present.  Constitutional:      General: She is not in acute distress.    Appearance: She is well-developed.  HENT:     Head: Normocephalic and atraumatic.  Eyes:     General: No scleral icterus. Cardiovascular:     Rate and Rhythm: Normal rate.  Pulmonary:     Effort: Pulmonary effort is normal.  Abdominal:     Palpations: Abdomen is soft.  Musculoskeletal:     Cervical back: Neck supple.  Skin:    General: Skin is warm and dry.  Neurological:     Mental Status: She is alert and oriented to person, place, and time.   UPT negative      Assessment & Plan:   Problem List Items Addressed This Visit       Unprioritized   Hypertension    Has several prior BPs that are elevated. Has PCP @ [facility]--begin ProcardiaXL 60 mg daily--f/u with PCP. DASH diet given      Relevant Medications   NIFEdipine (PROCARDIA XL) 60 MG 24 hr tablet   Other Visit Diagnoses     Encounter to determine fetal viability of pregnancy, single or unspecified fetus    -  Primary   Relevant Orders   US OB Limited   Beta hCG quant (ref lab)   Amenorrhea       declines provera to make her cycle start. Return for HCG if no cycle or desires.   Relevant Orders   POCT urine  pregnancy (Completed)       Return if symptoms worsen or fail to improve.  Donnamae Jude, MD 08/28/2022 11:57 AM

## 2022-08-28 NOTE — Progress Notes (Signed)
Pt here today for dating scan. LMP was 06/09/22, pt states she does have irregular cycles and had a positive and inconclusive UPT at home.   Abdominal US performed and nothing was seen.  UPT in office-negative.   Pt was stuck 3 times , unsuccessful for a HCG draw and would like to come back.   Also rechecked pt blood pressure and was severely elevated. Pt states she always has headaches.   Will have her see Dr Kennon Rounds before leaving office.

## 2022-09-05 ENCOUNTER — Encounter: Payer: Medicaid Other | Admitting: Advanced Practice Midwife

## 2022-12-19 ENCOUNTER — Other Ambulatory Visit (HOSPITAL_COMMUNITY)
Admission: RE | Admit: 2022-12-19 | Discharge: 2022-12-19 | Disposition: A | Discharge status: Home / Self Care | Payer: Medicaid Other | Source: Ambulatory Visit | Attending: Obstetrics & Gynecology | Admitting: Obstetrics & Gynecology

## 2022-12-19 ENCOUNTER — Ambulatory Visit (INDEPENDENT_AMBULATORY_CARE_PROVIDER_SITE_OTHER): Payer: Medicaid Other | Admitting: Obstetrics & Gynecology

## 2022-12-19 ENCOUNTER — Encounter: Payer: Self-pay | Admitting: Obstetrics & Gynecology

## 2022-12-19 VITALS — BP 127/93 | HR 78 | Ht 62.0 in | Wt 229.0 lb

## 2022-12-19 DIAGNOSIS — Z01419 Encounter for gynecological examination (general) (routine) without abnormal findings: Secondary | ICD-10-CM

## 2022-12-19 DIAGNOSIS — Z1339 Encounter for screening examination for other mental health and behavioral disorders: Secondary | ICD-10-CM

## 2022-12-19 DIAGNOSIS — Z3009 Encounter for other general counseling and advice on contraception: Secondary | ICD-10-CM | POA: Diagnosis not present

## 2022-12-19 DIAGNOSIS — Z113 Encounter for screening for infections with a predominantly sexual mode of transmission: Secondary | ICD-10-CM | POA: Diagnosis present

## 2022-12-19 DIAGNOSIS — Z01411 Encounter for gynecological examination (general) (routine) with abnormal findings: Secondary | ICD-10-CM

## 2022-12-19 NOTE — Progress Notes (Signed)
GYNECOLOGY ANNUAL PREVENTATIVE CARE ENCOUNTER NOTE  History:     Emma Green is a 37 y.o. G24P1001 female here for a routine annual gynecologic exam.  Current complaints: tender lump in area near thigh on her left side for the last two weeks.  Desires STI screen.   Denies abnormal vaginal bleeding, discharge, pelvic pain, problems with intercourse or other gynecologic concerns.    Gynecologic History No LMP recorded. Contraception: none Last Pap: 01/18/2020. Result was normal with negative HPV   Obstetric History OB History  Gravida Para Term Preterm AB Living  SAB IAB Ectopic Multiple Live Births          1    # Outcome Date GA Lbr Len/2nd Weight Sex Delivery Anes PTL Lv  2 Gravida           1 Term 01/18/07    M CS-LTranv   LIV     Complications: Fetal Intolerance    Past Medical History:  Diagnosis Date   Anxiety    Blood transfusion without reported diagnosis    Cervical cyst    Chlamydia    Depression    Exertional shortness of breath    Headache(784.0)    "weekly" (07/08/2013)   High cholesterol    Hypertension 08/28/2022   Migraine    "monthly" (07/08/2013)   MRSA (methicillin resistant staph aureus) culture positive    Pneumonia 2011   PTSD (post-traumatic stress disorder)    Pyelonephritis    Right nephrolithiasis 02/2020   UTI (urinary tract infection) 07/08/2013    Past Surgical History:  Procedure Laterality Date   CESAREAN SECTION  2008   TIBIA IM NAIL INSERTION Left 08/26/2013   Procedure: INTRAMEDULLARY (IM) NAIL TIBIAL;  Surgeon: Sheral Apley, MD;  Location: [facility] OR;  Service: Orthopedics;  Laterality: Left;    Current Outpatient Medications on File Prior to Visit  Medication Sig Dispense Refill   NIFEdipine (PROCARDIA XL) 60 MG 24 hr tablet Take 1 tablet (60 mg total) by mouth daily. 60 tablet 2   No current facility-administered medications on file prior to visit.    Allergies  Allergen Reactions   Tramadol Itching     Social History:  reports that she has quit smoking. Her smoking use included cigarettes. She has a 2.25 pack-year smoking history. She has never used smokeless tobacco. She reports current alcohol use of about 4.0 standard drinks of alcohol per week. She reports that she does not currently use drugs after having used the following drugs: Marijuana.  Family History  Problem Relation Age of Onset   Heart attack Maternal Grandmother    Diabetes Other    Hypertension Other    Cancer Other     The following portions of the patient's history were reviewed and updated as appropriate: allergies, current medications, past family history, past medical history, past social history, past surgical history and problem list.  Review of Systems Pertinent items noted in HPI and remainder of comprehensive ROS otherwise negative.  Physical Exam:  BP (!) 127/93   Pulse 78   Ht  (1.575 m)   Wt 229 lb (103.9 kg)   BMI 41.88 kg/m  CONSTITUTIONAL: Well-developed, well-nourished female in no acute distress.  HENT:  Normocephalic, atraumatic, External right and left ear normal.  EYES: Conjunctivae and EOM are normal. Pupils are equal, round, and reactive to light. No scleral icterus.  NECK: Normal range of motion, supple, no  masses.  Normal thyroid.  SKIN: Skin is warm and dry. No rash noted. Not diaphoretic. No erythema. No pallor. MUSCULOSKELETAL: Normal range of motion. No tenderness.  No cyanosis, clubbing, or edema. NEUROLOGIC: Alert and oriented to person, place, and time. Normal reflexes, muscle tone coordination.  PSYCHIATRIC: Normal mood and affect. Normal behavior. Normal judgment and thought content. CARDIOVASCULAR: Normal heart rate noted, regular rhythm RESPIRATORY: Clear to auscultation bilaterally. Effort and breath sounds normal, no problems with respiration noted. BREASTS: Symmetric in size. No masses, tenderness, skin changes, nipple drainage, or lymphadenopathy bilaterally.  Performed in the presence of a chaperone. ABDOMEN: Soft, no distention noted.  No tenderness, rebound or guarding.  PELVIC: Mobile, mildly tender hard area in subcutaneous area about 2 cm in length x 1 cm in width palpated in the juncture between lateral edge of left labium majus and left inner thigh > possible enlarged inguinal lymph node.  Normal appearing external genitalia and urethral meatus; normal appearing vaginal mucosa and cervix.  No abnormal vaginal discharge noted.  Pap smear obtained.  Normal uterine size, no other palpable masses, no uterine or adnexal tenderness.  Performed in the presence of a chaperone.   Assessment and Plan:     1. General counseling and advice on female contraception Reviewed all forms of birth control options available including abstinence; fertility period awareness methods; over the counter/barrier methods; progestin hormonal contraceptive medication including pill , injection,contraceptive implant; hormonal and nonhormonal IUDs; permanent sterilization options including vasectomy and the various tubal sterilization modalities. Risks and benefits reviewed.  Questions were answered.  Information was given to patient to review.  Want to avoid estrogen containing modalities given her HTN.  She will let us know what she wants later.  2. Routine screening for STI (sexually transmitted infection) STI screen done, will follow up results and manage accordingly. - Cytology - PAP - RPR+HBsAg+HCVAb+HIV  3. Well woman exam with routine gynecological exam - Cytology - PAP Will follow up results of pap smear and manage accordingly. Reassured about likely lymph node, should get better with time. Elevated BP readings noted. She is already on antihypertensives, will follow up PCP.  Routine preventative health maintenance measures emphasized. Please refer to After Visit Summary for other counseling recommendations.      Jaynie Collins, MD, FACOG Obstetrician &  Gynecologist, [facility] for [employer], [facility]

## 2022-12-20 LAB — RPR+HBSAG+HCVAB+...
HIV Screen 4th Generation wRfx: NONREACTIVE
Hep C Virus Ab: NONREACTIVE
Hepatitis B Surface Ag: NEGATIVE
RPR Ser Ql: NONREACTIVE

## 2022-12-23 LAB — CYTOLOGY - PAP
Chlamydia: NEGATIVE
Comment: NEGATIVE
Comment: NEGATIVE
Comment: NEGATIVE
Comment: NORMAL
Diagnosis: NEGATIVE
High risk HPV: NEGATIVE
Neisseria Gonorrhea: NEGATIVE
Trichomonas: NEGATIVE

## 2022-12-31 ENCOUNTER — Telehealth: Payer: Medicaid Other | Admitting: Physician Assistant

## 2022-12-31 NOTE — Progress Notes (Signed)
The patient no-showed for appointment despite this provider sending direct link with no response and waiting for at least 10 minutes from appointment time for patient to join. They will be marked as a NS for this appointment/time.   Jahniyah Revere Cody Jakob Kimberlin, PA-C    

## 2023-03-08 ENCOUNTER — Telehealth: Payer: MEDICAID | Admitting: Nurse Practitioner

## 2023-03-08 DIAGNOSIS — B9689 Other specified bacterial agents as the cause of diseases classified elsewhere: Secondary | ICD-10-CM | POA: Diagnosis not present

## 2023-03-08 DIAGNOSIS — H109 Unspecified conjunctivitis: Secondary | ICD-10-CM | POA: Diagnosis not present

## 2023-03-08 MED ORDER — POLYMYXIN B-TRIMETHOPRIM 10000-0.1 UNIT/ML-% OP SOLN: 1.0000 [drp] | OPHTHALMIC | 0 refills | Status: DC

## 2023-03-08 NOTE — Progress Notes (Signed)
I have spent 5 minutes in review of e-visit questionnaire, review and updating patient chart, medical decision making and response to patient.  ° °Emma Danford W Araeya Lamb, NP ° °  °

## 2023-03-08 NOTE — Progress Notes (Signed)

## 2023-06-30 ENCOUNTER — Emergency Department (HOSPITAL_COMMUNITY)
Admission: EM | Admit: 2023-06-30 | Discharge: 2023-06-30 | Disposition: A | Discharge status: Home / Self Care | Payer: MEDICAID | Attending: Emergency Medicine | Admitting: Emergency Medicine

## 2023-06-30 ENCOUNTER — Encounter (HOSPITAL_COMMUNITY): Payer: Self-pay | Admitting: Emergency Medicine

## 2023-06-30 ENCOUNTER — Other Ambulatory Visit: Payer: Self-pay

## 2023-06-30 DIAGNOSIS — G510 Bell's palsy: Secondary | ICD-10-CM | POA: Diagnosis not present

## 2023-06-30 DIAGNOSIS — R220 Localized swelling, mass and lump, head: Secondary | ICD-10-CM | POA: Diagnosis present

## 2023-06-30 LAB — URINALYSIS, W/ REFLEX TO CULTURE (INFECTION SUSPECTED)
Bilirubin Urine: NEGATIVE
Glucose, UA: NEGATIVE mg/dL
Hgb urine dipstick: NEGATIVE
Ketones, ur: NEGATIVE mg/dL
Leukocytes,Ua: NEGATIVE
Nitrite: NEGATIVE
Protein, ur: NEGATIVE mg/dL
Specific Gravity, Urine: 1.025 (ref 1.005–1.030)
pH: 5 (ref 5.0–8.0)

## 2023-06-30 LAB — PREGNANCY, URINE: Preg Test, Ur: NEGATIVE

## 2023-06-30 MED ORDER — VALACYCLOVIR HCL 1 G PO TABS: 1000.0000 mg | ORAL_TABLET | Freq: Three times a day (TID) | ORAL | 0 refills | Status: DC

## 2023-06-30 MED ORDER — HYPROMELLOSE (GONIOSCOPIC) 2.5 % OP SOLN: 1.0000 [drp] | Freq: Four times a day (QID) | OPHTHALMIC | 12 refills | Status: DC | PRN

## 2023-06-30 MED ORDER — PREDNISONE 10 MG PO TABS: 40.0000 mg | ORAL_TABLET | Freq: Every day | ORAL | 0 refills | Status: DC

## 2023-06-30 MED ORDER — ARTIFICIAL TEARS OPHTHALMIC OINT: TOPICAL_OINTMENT | Freq: Every evening | OPHTHALMIC | 1 refills | Status: DC | PRN

## 2023-06-30 NOTE — ED Triage Notes (Signed)
Patient arrives ambulatory by POV c/o waking up yesterday morning with left eye irritation. States she noticed it was swelling and watering. Then noticed the left side of her face felt less sensory then right. Reports drooling out of left side of face. Reports sweating at home and having to open up windows.

## 2023-06-30 NOTE — ED Notes (Signed)
Pt discharged home. Discharge information discussed. No s/s of distress observed during discharge. 

## 2023-06-30 NOTE — ED Provider Notes (Signed)
[place] EMERGENCY DEPARTMENT AT [facility] Provider Note   CSN: [id number] Arrival date & time: 06/30/23  4098     History  Chief Complaint  Patient presents with   Facial Swelling    Emma Green is a 37 y.o. female.  37 year old female with prior medical history as detailed below presents for evaluation.  Patient reports that she noted gradual onset of dry itchy left eye yesterday.  This was associated with decreased sensation across the left cheek.  She also noticed intermittent difficulty handling her secretions from the left side of her mouth.  All symptoms started gradually over the course of yesterday and they were more noticeable this morning.  She denies history of Bell's palsy.  She denies difficulty with her vision.  She denies recent fever, headache, focal weakness, chest pain, shortness of breath.  She does report that her urination overnight had some mild hesitancy and she almost felt like there was some burning.  She denies overt symptoms of UTI.  She does report that she has taken prednisone in the past without significant problem.  The history is provided by the patient and medical records.       Home Medications Prior to Admission medications   Medication Sig Start Date End Date Taking? Authorizing Provider  NIFEdipine (PROCARDIA XL) 60 MG 24 hr tablet Take 1 tablet (60 mg total) by mouth daily. 08/28/22   Reva Bores, MD  trimethoprim-polymyxin b Joaquim Lai) ophthalmic solution Place 1 drop into both eyes every 4 (four) hours. 03/08/23   Claiborne Rigg, NP      Allergies    Tramadol    Review of Systems   Review of Systems  Physical Exam Updated Vital Signs BP (!) 164/107 (BP Location: Left Arm)   Pulse 81   Temp 98 F (36.7 C) (Oral)   Resp 18   Ht 5\' 2"  (1.575 m)   Wt 103.9 kg   LMP 06/19/2023   SpO2 99%   BMI 41.88 kg/m  Physical Exam Vitals and nursing note reviewed.  Constitutional:      General: She is not in  acute distress.    Appearance: Normal appearance. She is well-developed.  HENT:     Head: Normocephalic and atraumatic.  Eyes:     Conjunctiva/sclera: Conjunctivae normal.     Pupils: Pupils are equal, round, and reactive to light.  Cardiovascular:     Rate and Rhythm: Normal rate and regular rhythm.     Heart sounds: Normal heart sounds.  Pulmonary:     Effort: Pulmonary effort is normal. No respiratory distress.     Breath sounds: Normal breath sounds.  Abdominal:     General: There is no distension.     Palpations: Abdomen is soft.     Tenderness: There is no abdominal tenderness.  Musculoskeletal:        General: No deformity. Normal range of motion.     Cervical back: Normal range of motion and neck supple.  Skin:    General: Skin is warm and dry.  Neurological:     Mental Status: She is alert and oriented to person, place, and time.     Comments: Alert, oriented x 4, normal speech, mild watery discharge noted from the left eye, Mildly decreased sensation reported with light touch to the left cheek, left child, and left forehead.  Mild left facial weakness is appreciated when patient attempted to raise her left eyebrow, wrinkle her left forehead, and with smiling (  left check).  Pattern of findings is consistent with likely peripheral lesion.       ED Results / Procedures / Treatments   Labs (all labs ordered are listed, but only abnormal results are displayed) Labs Reviewed - No data to display  EKG None  Radiology No results found.  Procedures Procedures    Medications Ordered in ED Medications - No data to display  ED Course/ Medical Decision Making/ A&P                                 Medical Decision Making Amount and/or Complexity of Data Reviewed Labs: ordered.  Risk OTC drugs. Prescription drug management.    Medical Screen Complete  This patient presented to the ED with complaint of left facial "numbness".  This complaint involves an  extensive number of treatment options. The initial differential diagnosis includes, but is not limited to, bells palsy  This presentation is: Acute, Self-Limited, Previously Undiagnosed, Uncertain Prognosis, and Complicated  Patient presents with gradual onset left facial numbness with associated tearing to the left eye and weakness of the left cheek muscle.  History and exam are entirely consistent with Bell's palsy.  Patient educated regarding treatment of same.  She understands need for close outpatient follow-up.  Strict return precautions given and understood.  Additional history obtained:  External records from outside sources obtained and reviewed including prior ED visits and prior Inpatient records.    Problem List / ED Course:  Bell's Palsy   Reevaluation:  After the interventions noted above, I reevaluated the patient and found that they have: stayed the same  Disposition:  After consideration of the diagnostic results and the patients response to treatment, I feel that the patent would benefit from close outpatient followup.          Final Clinical Impression(s) / ED Diagnoses Final diagnoses:  Bell's palsy    Rx / DC Orders ED Discharge Orders          Ordered    predniSONE (DELTASONE) 10 MG tablet  Daily        06/30/23 1021    artificial tears (LACRILUBE) OINT ophthalmic ointment  At bedtime PRN        06/30/23 1021    hydroxypropyl methylcellulose / hypromellose (ISOPTO TEARS / GONIOVISC) 2.5 % ophthalmic solution  4 times daily PRN        06/30/23 1021    valACYclovir (VALTREX) 1000 MG tablet  3 times daily        06/30/23 1021              Wynetta Fines, MD 06/30/23 1026

## 2023-06-30 NOTE — Discharge Instructions (Addendum)
Return for any problem.  Use artificial tears and tape as instructed for eye care.  Complete entire course of prednisone and Valtrex as prescribed.

## 2023-07-17 ENCOUNTER — Ambulatory Visit: Payer: MEDICAID

## 2023-07-21 ENCOUNTER — Encounter: Payer: Self-pay | Admitting: Neurology

## 2023-07-21 ENCOUNTER — Telehealth: Payer: Self-pay | Admitting: Neurology

## 2023-07-21 ENCOUNTER — Ambulatory Visit: Payer: Self-pay | Admitting: Neurology

## 2023-07-21 ENCOUNTER — Ambulatory Visit: Payer: MEDICAID

## 2023-07-21 NOTE — Telephone Encounter (Signed)
NS for New patient appt.

## 2023-08-12 ENCOUNTER — Ambulatory Visit: Payer: MEDICAID | Admitting: Obstetrics & Gynecology

## 2023-09-11 ENCOUNTER — Other Ambulatory Visit (HOSPITAL_COMMUNITY)
Admission: RE | Admit: 2023-09-11 | Discharge: 2023-09-11 | Disposition: A | Discharge status: Home / Self Care | Payer: MEDICAID | Source: Ambulatory Visit | Attending: Obstetrics & Gynecology | Admitting: Obstetrics & Gynecology

## 2023-09-11 ENCOUNTER — Encounter: Payer: Self-pay | Admitting: Obstetrics & Gynecology

## 2023-09-11 ENCOUNTER — Ambulatory Visit: Payer: MEDICAID | Admitting: Obstetrics & Gynecology

## 2023-09-11 VITALS — BP 137/104 | HR 86 | Wt 234.0 lb

## 2023-09-11 DIAGNOSIS — Z113 Encounter for screening for infections with a predominantly sexual mode of transmission: Secondary | ICD-10-CM | POA: Diagnosis not present

## 2023-09-11 DIAGNOSIS — N750 Cyst of Bartholin's gland: Secondary | ICD-10-CM

## 2023-09-11 DIAGNOSIS — N926 Irregular menstruation, unspecified: Secondary | ICD-10-CM | POA: Diagnosis not present

## 2023-09-11 NOTE — Progress Notes (Signed)
GYNECOLOGY OFFICE VISIT NOTE  History:   Emma Green is a 38 y.o. G2P1001 here today for evaluation of AR Bartholin's gland cyst. Noted 2/5 months ago, it is intermittently painful;.  Tried Sitz baths, no drainage.  Also desires STI screen and blood pregnancy test as her period is over two weeks overdue. She denies any abnormal vaginal discharge, bleeding, pelvic pain or other concerns.    Past Medical History:  Diagnosis Date   Anxiety    Blood transfusion without reported diagnosis    Cervical cyst    Chlamydia    Depression    Exertional shortness of breath    Headache(784.0)    "weekly" (07/08/2013)   High cholesterol    Hypertension 08/28/2022   Migraine    "monthly" (07/08/2013)   MRSA (methicillin resistant staph aureus) culture positive    Pneumonia 2011   PTSD (post-traumatic stress disorder)    Pyelonephritis    Right nephrolithiasis 02/2020   UTI (urinary tract infection) 07/08/2013    Past Surgical History:  Procedure Laterality Date   CESAREAN SECTION  2008   TIBIA IM NAIL INSERTION Left 08/26/2013   Procedure: INTRAMEDULLARY (IM) NAIL TIBIAL;  Surgeon: Sheral Apley, MD;  Location: [facility] OR;  Service: Orthopedics;  Laterality: Left;    The following portions of the patient's history were reviewed and updated as appropriate: allergies, current medications, past family history, past medical history, past social history, past surgical history and problem list.   Health Maintenance:  Normal pap and negative HRHPV on 12/19/2022.    Review of Systems:  Pertinent items noted in HPI and remainder of comprehensive ROS otherwise negative.  Physical Exam:  BP (!) 137/104   Pulse 86   Wt 234 lb (106.1 kg)   BMI 42.80 kg/m  CONSTITUTIONAL: Well-developed, well-nourished female in no acute distress.  HEENT:  Normocephalic, atraumatic. External right and left ear normal. No scleral icterus.  NECK: Normal range of motion, supple, no masses noted on  observation SKIN: No rash noted. Not diaphoretic. No erythema. No pallor. MUSCULOSKELETAL: Normal range of motion. No edema noted. NEUROLOGIC: Alert and oriented to person, place, and time. Normal muscle tone coordination. No cranial nerve deficit noted. PSYCHIATRIC: Normal mood and affect. Normal behavior. Normal judgment and thought content. CARDIOVASCULAR: Normal heart rate noted RESPIRATORY: Effort and breath sounds normal, no problems with respiration noted ABDOMEN: No masses noted. No other overt distention noted.   PELVIC: Normal appearing external genitalia; small (1.5 cm) right Bartholin;s gland cyst noted, mild tenderness to palpation, not much fluctuance noted, no erythema, no induration, no drainage.  Normal urethral meatus; normal appearing distal vaginal mucosa . Scant white discharge noted, STI swab obtained.  Performed in the presence of a chaperone    Assessment and Plan:     1. Missed period - Beta hCG quant (ref lab) done, will follow up results and manage accordingly.  2. Routine screening for STI (sexually transmitted infection) STI screen done, will follow up results and manage accordingly. - RPR+HBsAg+HCVAb+... - Cervicovaginal ancillary only  3. Right Bartholin's gland cyst (Primary) Small cyst noted.  Offered I&D, patient declined.  Also discussed marsupialization, she declines for now. Given information about continued conservative management. If worsens, she was told to call back/come in for further evaluation.  Routine preventative health maintenance measures emphasized. Please refer to After Visit Summary for other counseling recommendations.   Return for any worsening symptoms.    I spent 30 minutes dedicated to the care of this  patient including pre-visit review of records, face to face time with the patient discussing her conditions and treatments and post visit orders.    Jaynie Collins, MD, FACOG Obstetrician & Gynecologist, [facility] for [employer], [facility]

## 2023-09-11 NOTE — Progress Notes (Deleted)
2 1/2 months now pt has tried sitz bath still no relief.

## 2023-09-12 ENCOUNTER — Encounter: Payer: Self-pay | Admitting: Obstetrics & Gynecology

## 2023-09-12 LAB — RPR+HBSAG+HCVAB+...
HIV Screen 4th Generation wRfx: NONREACTIVE
Hep C Virus Ab: NONREACTIVE
Hepatitis B Surface Ag: NEGATIVE
RPR Ser Ql: NONREACTIVE

## 2023-09-12 LAB — CERVICOVAGINAL ANCILLARY ONLY
Chlamydia: NEGATIVE
Comment: NEGATIVE
Comment: NEGATIVE
Comment: NORMAL
Neisseria Gonorrhea: NEGATIVE
Trichomonas: NEGATIVE

## 2023-09-12 LAB — BETA HCG QUANT (REF LAB): hCG Quant: <1 m[IU]/mL

## 2023-10-06 ENCOUNTER — Other Ambulatory Visit: Payer: Self-pay

## 2023-10-06 ENCOUNTER — Emergency Department (HOSPITAL_COMMUNITY)
Admission: EM | Admit: 2023-10-06 | Discharge: 2023-10-06 | Disposition: A | Discharge status: Home / Self Care | Payer: MEDICAID | Attending: Emergency Medicine | Admitting: Emergency Medicine

## 2023-10-06 ENCOUNTER — Encounter (HOSPITAL_COMMUNITY): Payer: Self-pay | Admitting: *Deleted

## 2023-10-06 DIAGNOSIS — L732 Hidradenitis suppurativa: Secondary | ICD-10-CM | POA: Diagnosis not present

## 2023-10-06 DIAGNOSIS — R21 Rash and other nonspecific skin eruption: Secondary | ICD-10-CM | POA: Diagnosis present

## 2023-10-06 MED ORDER — DOXYCYCLINE HYCLATE 100 MG PO CAPS: 100.0000 mg | ORAL_CAPSULE | Freq: Two times a day (BID) | ORAL | 0 refills | Status: DC

## 2023-10-06 MED ORDER — CEPHALEXIN 500 MG PO CAPS: 500.0000 mg | ORAL_CAPSULE | Freq: Four times a day (QID) | ORAL | 0 refills | Status: DC

## 2023-10-06 NOTE — ED Triage Notes (Signed)
 Here by POV from home for HS flare up (Hidranitis Suppuritiva), onset last night, h/o same, denies recent ABT or steroids, denies fever or sx other than pain. No meds PTA. Alert, NAD, calm, interactive, resps e/u, speaking in clear complete sentences.

## 2023-10-06 NOTE — ED Provider Notes (Signed)
 [facility] EMER[facility]Y [facility] AT [facility] Provider Note   CSN: [id number] Arrival date & time: 10/06/23  5956     History  Chief Complaint  Patient presents with   Rash    Emma Green is a 38 y.o. female with primary history of hidradenitis suppurativa.  The patient presents to the ED for evaluation of HS flareup.  States that she typically will get abscesses consistent with HS underneath her bilateral axilla.  States that beginning yesterday she developed an area of irritation to her left breast.  Denies currently breast-feeding.  She reports that the pain is significant.  She denies any drainage from this area.  Denies any abscess-like formation.  Denies fevers, nausea or vomiting at home.  Reports he does have a PCP but has never seen her PCP for this.  Has never seen HS specialist.    Rash Associated symptoms: no fever, no nausea and not vomiting        Home Medications Prior to Admission medications   Medication Sig Start Date End Date Taking? Authorizing Provider  cephALEXin  (KEFLEX ) 500 MG capsule Take 1 capsule (500 mg total) by mouth 4 (four) times daily. 10/06/23  Yes Adel Aden, PA-C  doxycycline  (VIBRAMYCIN ) 100 MG capsule Take 1 capsule (100 mg total) by mouth 2 (two) times daily. 10/06/23  Yes Adel Aden, PA-C  artificial tears (LACRILUBE) OINT ophthalmic ointment Place into the left eye at bedtime as needed for dry eyes. Patient not taking: Reported on 09/11/2023 06/30/23   Burnette Carte, MD  hydroxypropyl methylcellulose / hypromellose (ISOPTO TEARS / GONIOVISC) 2.5 % ophthalmic solution Place 1 drop into the left eye 4 (four) times daily as needed for dry eyes. Patient not taking: Reported on 09/11/2023 06/30/23   Burnette Carte, MD  NIFEdipine  (PROCARDIA  XL) 60 MG 24 hr tablet Take 1 tablet (60 mg total) by mouth daily. 08/28/22   Granville Layer, MD  predniSONE  (DELTASONE ) 10 MG tablet Take 4 tablets (40 mg total) by mouth  daily. Patient not taking: Reported on 09/11/2023 06/30/23   Burnette Carte, MD  trimethoprim -polymyxin b  (POLYTRIM ) ophthalmic solution Place 1 drop into both eyes every 4 (four) hours. Patient not taking: Reported on 09/11/2023 03/08/23   Fleming, Zelda W, NP  valACYclovir  (VALTREX ) 1000 MG tablet Take 1 tablet (1,000 mg total) by mouth 3 (three) times daily. Patient not taking: Reported on 09/11/2023 06/30/23   Burnette Carte, MD      Allergies    Tramadol     Review of Systems   Review of Systems  Constitutional:  Negative for fever.  Gastrointestinal:  Negative for nausea and vomiting.  Skin:  Positive for rash.  All other systems reviewed and are negative.   Physical Exam Updated Vital Signs BP (!) 164/126 (BP Location: Left Arm)   Pulse 91   Temp 98.5 F (36.9 C)   Resp 20   LMP 09/15/2023 (Approximate)   SpO2 99%  Physical Exam Vitals and nursing note reviewed.  Constitutional:      General: She is not in acute distress.    Appearance: She is well-developed.  HENT:     Head: Normocephalic and atraumatic.  Eyes:     Conjunctiva/sclera: Conjunctivae normal.  Cardiovascular:     Rate and Rhythm: Normal rate and regular rhythm.     Heart sounds: No murmur heard. Pulmonary:     Effort: Pulmonary effort is normal. No respiratory distress.     Breath  sounds: Normal breath sounds.  Chest:       Comments: 3 x 2 cm area of induration without fluctuance.  There is surrounding erythema.  No obvious abscess needing drainage. Abdominal:     Palpations: Abdomen is soft.     Tenderness: There is no abdominal tenderness.  Musculoskeletal:        General: No swelling.     Cervical back: Neck supple.  Skin:    General: Skin is warm and dry.     Capillary Refill: Capillary refill takes less than 2 seconds.  Neurological:     Mental Status: She is alert.  Psychiatric:        Mood and Affect: Mood normal.     ED Results / Procedures / Treatments   Labs (all labs  ordered are listed, but only abnormal results are displayed) Labs Reviewed - No data to display  EKG None  Radiology No results found.  Procedures Procedures    Medications Ordered in ED Medications - No data to display  ED Course/ Medical Decision Making/ A&P  Medical Decision Making  38 year old female presents for evaluation of HS flareup.  Patient states that for the last 1 days she has had an area of irritation to her left breast consistent with past flareups of her Hydro nidus reports he would.  The patient denies any fevers, nausea or vomiting at home.  Denies any drainage from this area.  Report significant pain.  Denies ever seeing specialist for hidradenitis suppurativa.  On exam the patient has a 3 x 2 cm area of induration without any fluctuance.  There is surrounding erythema.  No obvious abscess needing drainage.  I do not feel that the patient would benefit from an attempted I&D at this time.  Will start patient on Keflex  and doxycycline .  Will refer her to hidradenitis suppurativa clinic.  Have advised her that in the event that abscess does form she will need to return to the ED for I&D and she voices understanding.  Patient stable to discharge at this time.   Final Clinical Impression(s) / ED Diagnoses Final diagnoses:  Hidradenitis suppurativa    Rx / DC Orders ED Discharge Orders          Ordered    cephALEXin  (KEFLEX ) 500 MG capsule  4 times daily        10/06/23 0953    doxycycline  (VIBRAMYCIN ) 100 MG capsule  2 times daily        10/06/23 0953              Adel Aden, PA-C 10/06/23 1610    Mozell Arias, MD 10/06/23 [phone]

## 2023-10-06 NOTE — Discharge Instructions (Addendum)
 It was a pleasure taking part in your care.  As we discussed, please begin taking Keflex  4 times a day for the next 7 days.  Please also begin taking doxycycline  twice a day for the next 7 days.  Please read attached guide concerning hidradenitis suppurativa.  If an abscess forms in the area of irritation, please return to the ED for drainage.  Please follow-up with the hidradenitis suppurativa clinic.  Their number is [phone].  Their address is [address] and they are located at the [facility].

## 2023-10-24 ENCOUNTER — Encounter (HOSPITAL_COMMUNITY): Payer: Self-pay

## 2023-10-24 ENCOUNTER — Emergency Department (HOSPITAL_COMMUNITY)
Admission: EM | Admit: 2023-10-24 | Discharge: 2023-10-24 | Disposition: A | Discharge status: Home / Self Care | Payer: MEDICAID | Attending: Emergency Medicine | Admitting: Emergency Medicine

## 2023-10-24 ENCOUNTER — Other Ambulatory Visit: Payer: Self-pay

## 2023-10-24 DIAGNOSIS — I10 Essential (primary) hypertension: Secondary | ICD-10-CM | POA: Insufficient documentation

## 2023-10-24 DIAGNOSIS — M79642 Pain in left hand: Secondary | ICD-10-CM | POA: Diagnosis not present

## 2023-10-24 DIAGNOSIS — Z720 Tobacco use: Secondary | ICD-10-CM | POA: Insufficient documentation

## 2023-10-24 DIAGNOSIS — M7989 Other specified soft tissue disorders: Secondary | ICD-10-CM | POA: Insufficient documentation

## 2023-10-24 DIAGNOSIS — M79641 Pain in right hand: Secondary | ICD-10-CM | POA: Insufficient documentation

## 2023-10-24 MED ORDER — DICLOFENAC SODIUM 1 % EX GEL: 2.0000 g | Freq: Four times a day (QID) | CUTANEOUS | 0 refills | Status: DC | PRN

## 2023-10-24 NOTE — Discharge Instructions (Addendum)
 Your hand pain is likely due to overuse from your new job.  You may continue to take muscle relaxant and anti-inflammatory medication as needed.  You may apply Voltaren gel to both hands up to 4 times daily for comfort.  Try soaking your hands in warm water with Epsom salt for comfort as well.

## 2023-10-24 NOTE — ED Provider Notes (Signed)
 [place] EMERGENCY DEPARTMENT AT [facility] Provider Note   CSN: [id number] Arrival date & time: 10/24/23  1458     History  Chief Complaint  Patient presents with   Hand Pain    Emma Green is a 38 y.o. female.  The history is provided by the patient and medical records. No language interpreter was used.  Hand Pain     38 year old female history of hypercholesterolemia, anxiety, tobacco use, hypertension presenting complaint of hand swelling.  Patient report for the past 2 to 3 days she has noticed increasing pain and swelling to both of her hands.  Pain is worsened with using her hands.  States she recently started a new job about a month ago that required a lot of repetitive hand use such and felt that her symptom may be related to that.  She does not endorse any chest pain or shortness of breath denies any swelling of her legs no history of PE or DVT no recent injury.  She tries to take muscle relaxant and anti-inflammatory medication at home without adequate relief.  No history of carpal tunnel syndrome.  Home Medications Prior to Admission medications   Medication Sig Start Date End Date Taking? Authorizing Provider  artificial tears (LACRILUBE) OINT ophthalmic ointment Place into the left eye at bedtime as needed for dry eyes. Patient not taking: Reported on 09/11/2023 06/30/23   Wynetta Fines, MD  cephALEXin (KEFLEX) 500 MG capsule Take 1 capsule (500 mg total) by mouth 4 (four) times daily. 10/06/23   Al Decant, PA-C  doxycycline (VIBRAMYCIN) 100 MG capsule Take 1 capsule (100 mg total) by mouth 2 (two) times daily. 10/06/23   Al Decant, PA-C  hydroxypropyl methylcellulose / hypromellose (ISOPTO TEARS / GONIOVISC) 2.5 % ophthalmic solution Place 1 drop into the left eye 4 (four) times daily as needed for dry eyes. Patient not taking: Reported on 09/11/2023 06/30/23   Wynetta Fines, MD  NIFEdipine (PROCARDIA XL) 60 MG 24 hr tablet  Take 1 tablet (60 mg total) by mouth daily. 08/28/22   Reva Bores, MD  predniSONE (DELTASONE) 10 MG tablet Take 4 tablets (40 mg total) by mouth daily. Patient not taking: Reported on 09/11/2023 06/30/23   Wynetta Fines, MD  trimethoprim-polymyxin b Joaquim Lai) ophthalmic solution Place 1 drop into both eyes every 4 (four) hours. Patient not taking: Reported on 09/11/2023 03/08/23   Claiborne Rigg, NP  valACYclovir (VALTREX) 1000 MG tablet Take 1 tablet (1,000 mg total) by mouth 3 (three) times daily. Patient not taking: Reported on 09/11/2023 06/30/23   Wynetta Fines, MD      Allergies    Tramadol    Review of Systems   Review of Systems  Constitutional:  Negative for fever.  Musculoskeletal:  Positive for arthralgias.  Skin:  Negative for wound.    Physical Exam Updated Vital Signs BP (!) 169/119 (BP Location: Right Arm)   Pulse 76   Temp 97.8 F (36.6 C) (Oral)   Resp 18   Ht 5\' 2"  (1.575 m)   Wt 106.1 kg   LMP 09/15/2023 (Approximate)   SpO2 94%   BMI 42.78 kg/m  Physical Exam Vitals and nursing note reviewed.  Constitutional:      General: She is not in acute distress.    Appearance: She is well-developed.  HENT:     Head: Atraumatic.  Eyes:     Conjunctiva/sclera: Conjunctivae normal.  Pulmonary:     Effort:  Pulmonary effort is normal.  Musculoskeletal:        General: Tenderness (Mild diffuse tenderness about both hands without any obvious edema erythema or warmth.  Brisk cap refill to all fingers.  Full range of motion throughout hands and wrist.) present.     Cervical back: Neck supple.  Skin:    Capillary Refill: Capillary refill takes less than 2 seconds.     Findings: No rash.  Neurological:     Mental Status: She is alert.  Psychiatric:        Mood and Affect: Mood normal.     ED Results / Procedures / Treatments   Labs (all labs ordered are listed, but only abnormal results are displayed) Labs Reviewed - No data to  display  EKG None  Radiology No results found.  Procedures Procedures    Medications Ordered in ED Medications - No data to display  ED Course/ Medical Decision Making/ A&P                                 Medical Decision Making  BP (!) 169/119 (BP Location: Right Arm)   Pulse 76   Temp 97.8 F (36.6 C) (Oral)   Resp 18   Ht 5\' 2"  (1.575 m)   Wt 106.1 kg   LMP 09/15/2023 (Approximate)   SpO2 94%   BMI 42.78 kg/m   34:54 PM  38 year old female history of hypercholesterolemia, anxiety, tobacco use, hypertension presenting complaint of hand swelling.  Patient report for the past 2 to 3 days she has noticed increasing pain and swelling to both of her hands.  Pain is worsened with using her hands.  States she recently started a new job about a month ago that required a lot of repetitive hand use such and felt that her symptom may be related to that.  She does not endorse any chest pain or shortness of breath denies any swelling of her legs no history of PE or DVT no recent injury.  She tries to take muscle relaxant and anti-inflammatory medication at home without adequate relief.  No history of carpal tunnel syndrome.  Examination of both hands notable for mild tenderness to palpation but there are no obvious edema erythema or warmth appreciated.  Patient is neurovascularly intact.  No deformity noted.  Suspect hands discomfort is likely due to repetitive usage after the patient started a new job 4 months ago.  I have low suspicion for CHF, DVT, infectious, fracture or dislocation causing his symptoms.  I have low suspicion for carpal tunnel syndrome.  I recommend RICE therapy, rest, using Voltaren gel and also using Epsom salt bath to help with her symptoms.  Return precaution given.        Final Clinical Impression(s) / ED Diagnoses Final diagnoses:  Pain in both hands    Rx / DC Orders ED Discharge Orders     None         Fayrene Helper, PA-C 10/24/23 1945     Loetta Rough, MD 10/30/23 2236

## 2023-10-24 NOTE — ED Triage Notes (Signed)
 Patient is here for evaluation of swelling and pain to bilateral hands. Reports it started last week but this week it became much worse. Denies any injuries.

## 2023-11-30 ENCOUNTER — Other Ambulatory Visit: Payer: Self-pay

## 2023-11-30 ENCOUNTER — Encounter (HOSPITAL_COMMUNITY): Payer: Self-pay | Admitting: *Deleted

## 2023-11-30 ENCOUNTER — Emergency Department (HOSPITAL_COMMUNITY)
Admission: EM | Admit: 2023-11-30 | Discharge: 2023-11-30 | Disposition: A | Discharge status: Home / Self Care | Payer: MEDICAID | Attending: Emergency Medicine | Admitting: Emergency Medicine

## 2023-11-30 DIAGNOSIS — I10 Essential (primary) hypertension: Secondary | ICD-10-CM | POA: Diagnosis not present

## 2023-11-30 DIAGNOSIS — F17200 Nicotine dependence, unspecified, uncomplicated: Secondary | ICD-10-CM | POA: Diagnosis not present

## 2023-11-30 DIAGNOSIS — N912 Amenorrhea, unspecified: Secondary | ICD-10-CM | POA: Insufficient documentation

## 2023-11-30 DIAGNOSIS — N939 Abnormal uterine and vaginal bleeding, unspecified: Secondary | ICD-10-CM | POA: Diagnosis present

## 2023-11-30 LAB — CBC
HCT: 36.2 % (ref 36.0–46.0)
Hemoglobin: 11.8 g/dL — ABNORMAL LOW (ref 12.0–15.0)
MCH: 30.2 pg (ref 26.0–34.0)
MCHC: 32.6 g/dL (ref 30.0–36.0)
MCV: 92.6 fL (ref 80.0–100.0)
Platelets: 312 10*3/uL (ref 150–400)
RBC: 3.91 MIL/uL (ref 3.87–5.11)
RDW: 13.9 % (ref 11.5–15.5)
WBC: 6.9 10*3/uL (ref 4.0–10.5)
nRBC: 0 % (ref 0.0–0.2)

## 2023-11-30 LAB — URINALYSIS, ROUTINE W REFLEX MICROSCOPIC
Bilirubin Urine: NEGATIVE
Glucose, UA: NEGATIVE mg/dL
Hgb urine dipstick: NEGATIVE
Ketones, ur: NEGATIVE mg/dL
Leukocytes,Ua: NEGATIVE
Nitrite: NEGATIVE
Protein, ur: NEGATIVE mg/dL
Specific Gravity, Urine: 1.024 (ref 1.005–1.030)
pH: 5 (ref 5.0–8.0)

## 2023-11-30 LAB — HCG, QUANTITATIVE, PREGNANCY: hCG, Beta Chain, Quant, S: <1 m[IU]/mL (ref <5)

## 2023-11-30 LAB — COMPREHENSIVE METABOLIC PANEL WITH GFR
ALT: 19 U/L (ref 0–44)
AST: 27 U/L (ref 15–41)
Albumin: 3.7 g/dL (ref 3.5–5.0)
Alkaline Phosphatase: 46 U/L (ref 38–126)
Anion gap: 6 (ref 5–15)
BUN: 14 mg/dL (ref 6–20)
CO2: 26 mmol/L (ref 22–32)
Calcium: 9.3 mg/dL (ref 8.9–10.3)
Chloride: 107 mmol/L (ref 98–111)
Creatinine, Ser: 0.92 mg/dL (ref 0.44–1.00)
GFR, Estimated: >60 mL/min (ref >60)
Glucose, Bld: 88 mg/dL (ref 70–99)
Potassium: 3.9 mmol/L (ref 3.5–5.1)
Sodium: 139 mmol/L (ref 135–145)
Total Bilirubin: 0.7 mg/dL (ref 0.0–1.2)
Total Protein: 7 g/dL (ref 6.5–8.1)

## 2023-11-30 NOTE — ED Notes (Signed)
 Discharge instructions reviewed.   Opportunity for questions and concerns provided.   Alert, oriented and ambulatory. Displays no signs of distress.

## 2023-11-30 NOTE — Discharge Instructions (Addendum)
 As discussed, your workup today was overall reassuring.  Your blood pregnancy test was negative so you are not pregnant currently.  Otherwise, your labs looked great.  Will recommend follow-up with OB/GYN as you may have some ovarian dysfunction as cause of your infrequent menstrual cycles.  You may take Zyrtec/Claritin/Allegra for any itching sensation but your liver function and total bilirubin looked great today.  Please do not hesitate to return to the emergency department if the worrisome signs and symptoms we discussed become apparent.

## 2023-11-30 NOTE — ED Triage Notes (Signed)
 The  pt has not had a period for 4 months she thinks she might be pregnant ???  Lmp dec

## 2023-11-30 NOTE — ED Provider Notes (Signed)
 [place] EMERGENCY DEPARTMENT AT [facility] Provider Note   CSN: [id number] Arrival date & time: 11/30/23  2032     History  Chief Complaint  Patient presents with   Amenorrhea    Emma Green is a 38 y.o. female.  HPI   38 year old female presents emergency department with complaints of missed menstrual cycle.  States that she usually has menstrual cycles monthly but has not had a menstrual cycle since December.  States that she did have 1 episode of vaginal bleeding that was somewhat spotty in nature about a month and a half ago.  Lasted for a day before resolved.  Denies any pelvic pain, vaginal symptoms otherwise, fever, chills, nausea, vomiting.  Patient also reports itching in her hands and feet.  States that this has been somewhat intermittent been present for the past couple of months.  Denies any known exacerbating relieving factors.  States that she would like to have this checked as well.  Past medical history significant for hypertension, headache, hypercholesterolemia, PTSD, pyelonephritis, obesity, tobacco abuse  Home Medications Prior to Admission medications   Medication Sig Start Date End Date Taking? Authorizing Provider  artificial tears (LACRILUBE) OINT ophthalmic ointment Place into the left eye at bedtime as needed for dry eyes. Patient not taking: Reported on 09/11/2023 06/30/23   Wynetta Fines, MD  cephALEXin (KEFLEX) 500 MG capsule Take 1 capsule (500 mg total) by mouth 4 (four) times daily. 10/06/23   Al Decant, PA-C  diclofenac Sodium (VOLTAREN ARTHRITIS PAIN) 1 % GEL Apply 2 g topically 4 (four) times daily as needed (pain). 10/24/23   Fayrene Helper, PA-C  doxycycline (VIBRAMYCIN) 100 MG capsule Take 1 capsule (100 mg total) by mouth 2 (two) times daily. 10/06/23   Al Decant, PA-C  hydroxypropyl methylcellulose / hypromellose (ISOPTO TEARS / GONIOVISC) 2.5 % ophthalmic solution Place 1 drop into the left eye 4 (four)  times daily as needed for dry eyes. Patient not taking: Reported on 09/11/2023 06/30/23   Wynetta Fines, MD  NIFEdipine (PROCARDIA XL) 60 MG 24 hr tablet Take 1 tablet (60 mg total) by mouth daily. 08/28/22   Reva Bores, MD  predniSONE (DELTASONE) 10 MG tablet Take 4 tablets (40 mg total) by mouth daily. Patient not taking: Reported on 09/11/2023 06/30/23   Wynetta Fines, MD  trimethoprim-polymyxin b Joaquim Lai) ophthalmic solution Place 1 drop into both eyes every 4 (four) hours. Patient not taking: Reported on 09/11/2023 03/08/23   Claiborne Rigg, NP  valACYclovir (VALTREX) 1000 MG tablet Take 1 tablet (1,000 mg total) by mouth 3 (three) times daily. Patient not taking: Reported on 09/11/2023 06/30/23   Wynetta Fines, MD      Allergies    Tramadol    Review of Systems   Review of Systems  All other systems reviewed and are negative.   Physical Exam Updated Vital Signs BP (!) 135/90 (BP Location: Right Arm)   Pulse 84   Temp 97.9 F (36.6 C)   Resp 20   Ht 5\' 2"  (1.575 m)   Wt 106.1 kg   LMP 08/05/2023   SpO2 97%   BMI 42.78 kg/m  Physical Exam Vitals and nursing note reviewed.  Constitutional:      General: She is not in acute distress.    Appearance: She is well-developed.  HENT:     Head: Normocephalic and atraumatic.  Eyes:     Conjunctiva/sclera: Conjunctivae normal.  Cardiovascular:  Rate and Rhythm: Normal rate and regular rhythm.     Heart sounds: No murmur heard. Pulmonary:     Effort: Pulmonary effort is normal. No respiratory distress.     Breath sounds: Normal breath sounds. No wheezing, rhonchi or rales.  Abdominal:     Palpations: Abdomen is soft.     Tenderness: There is no abdominal tenderness. There is no guarding.  Musculoskeletal:        General: No swelling.     Cervical back: Neck supple.  Skin:    General: Skin is warm and dry.     Capillary Refill: Capillary refill takes less than 2 seconds.     Findings: No rash.  Neurological:      Mental Status: She is alert.  Psychiatric:        Mood and Affect: Mood normal.     ED Results / Procedures / Treatments   Labs (all labs ordered are listed, but only abnormal results are displayed) Labs Reviewed  URINALYSIS, ROUTINE W REFLEX MICROSCOPIC  HCG, QUANTITATIVE, PREGNANCY  POC URINE PREG, ED    EKG None  Radiology No results found.  Procedures Procedures    Medications Ordered in ED Medications - No data to display  ED Course/ Medical Decision Making/ A&P                                 Medical Decision Making Amount and/or Complexity of Data Reviewed Labs: ordered.   This patient presents to the ED for concern of hematuria, this involves an extensive number of treatment options, and is a complaint that carries with it a high risk of complications and morbidity.  The differential diagnosis includes ovarian dysfunction, pregnancy, other   Co morbidities that complicate the patient evaluation  See HPI   Additional history obtained:  Additional history obtained from EMR External records from outside source obtained and reviewed including hospital records   Lab Tests:  I Ordered, and personally interpreted labs.  The pertinent results include: No leukocytosis.  Anemia with hemoglobin 1.8.  Place within range.  Much abnormalities.  No transaminitis.  No renal dysfunction.  hCG negative.  UA without abnormality.   Imaging Studies ordered:  N/a   Cardiac Monitoring: / EKG:  The patient was maintained on a cardiac monitor.  I personally viewed and interpreted the cardiac monitored which showed an underlying rhythm of: sinus rhythm   Consultations Obtained:  N/a   Problem List / ED Course / Critical interventions / Medication management  Amenorrhea Reevaluation of the patient showed that the patient stayed the same I have reviewed the patients home medicines and have made adjustments as needed   Social Determinants of  Health:  Tobacco abuse.  Denies illicit drug use.   Test / Admission - Considered:  Amenorrhea Vitals signs within normal range and stable throughout visit. Laboratory/imaging studies significant for: See above 38 year old female presents emergency department with complaints of missed menstrual cycle.  States that she usually has menstrual cycles monthly but has not had a menstrual cycle since December.  States that she did have 1 episode of vaginal bleeding that was somewhat spotty in nature about a month and a half ago.  Lasted for a day before resolved.  Denies any pelvic pain, vaginal symptoms otherwise, fever, chills, nausea, vomiting.  Patient also reports itching in her hands and feet.  States that this has been somewhat intermittent been present for  the past couple of months.  Denies any known exacerbating relieving factors.  States that she would like to have this checked as well. On exam, no palpable tenderness.  No obvious palpable uterus although difficult to assess secondary to patient's body habitus.  Laboratory studies with negative hCG; doubt pregnancy.  Patient's hematuria could be secondary to ovarian dysfunction.  Will recommend follow-up with OB/GYN in the outpatient setting.  Regarding itching sensation, total bilirubin within normal limits.  Unsure of exact etiology of patient's intermittent itching but symptoms do not seem secondary to emergent process.  Would recommend follow-up with PCP in the outpatient setting for reassessment.  Treatment plan discussed with patient and she acknowledged understanding was agreeable to said plan.  Patient overall well-appearing, afebrile in no acute distress. Worrisome signs and symptoms were discussed with the patient, and the patient acknowledged understanding to return to the ED if noticed. Patient was stable upon discharge.          Final Clinical Impression(s) / ED Diagnoses Final diagnoses:  None    Rx / DC Orders ED  Discharge Orders     None         Peter Garter, [place] 12/01/23 0000    Gerhard Munch, MD 12/03/23 [phone]

## 2024-03-02 ENCOUNTER — Ambulatory Visit (INDEPENDENT_AMBULATORY_CARE_PROVIDER_SITE_OTHER): Payer: MEDICAID | Admitting: Obstetrics and Gynecology

## 2024-03-02 ENCOUNTER — Encounter: Payer: Self-pay | Admitting: Obstetrics and Gynecology

## 2024-03-02 VITALS — BP 139/93 | HR 94 | Ht 61.5 in | Wt 226.0 lb

## 2024-03-02 DIAGNOSIS — Z6841 Body Mass Index (BMI) 40.0 and over, adult: Secondary | ICD-10-CM

## 2024-03-02 DIAGNOSIS — N939 Abnormal uterine and vaginal bleeding, unspecified: Secondary | ICD-10-CM

## 2024-03-02 DIAGNOSIS — Z3189 Encounter for other procreative management: Secondary | ICD-10-CM

## 2024-03-02 DIAGNOSIS — R030 Elevated blood-pressure reading, without diagnosis of hypertension: Secondary | ICD-10-CM

## 2024-03-02 NOTE — Progress Notes (Signed)
   RETURN GYNECOLOGY VISIT  Subjective:  FOREST PRUDEN is a 38 y.o. G2P1011 with LMP 02/18/24 presenting for discussion of irregular periods and desire to conceive   Unprotected intercourse for the past year but not timing IC to ovulation. Cycles very irregular. In the past year, they were coming monthly for awhile, but has skipped a month. Has never skipped more than 1 month. Periods last 6 days. 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 Notes cycles are heavy and changes pads every 2-3 hours. Has not recently been on contraception. She is engaged and her partner has fathered 4 children.   She has conceived twice with her first child being born in 2008 via CS followed by an SAB.   Not taking prenatal vitamin   Normal pelvic ultrasound in 2021. R ovarian volume was 14mL with multiple follicles and a 1.4cm hemorrhagic cyst.  She is taking humira for HS. Her blood pressure is elevated, but she is not taking blood pressure medication right now. BMI is 42 - she has tried losing weight by increasing physical activity and working with a nutritionist without success. With her job as a Therapist, occupational she is walking to/from deliveries and going up stairs.   Denies hot flashes, acne, or worsening hair growth  Objective:   Vitals:   03/02/24 1104  BP: (!) 139/93  Pulse: 94  Weight: 226 lb (102.5 kg)  Height: 5' 1.5 (1.562 m)   General:  Alert, oriented and cooperative. Patient is in no acute distress.  Skin: Skin is warm and dry. No rash noted.   Cardiovascular: Normal heart rate noted  Respiratory: Normal respiratory effort, no problems with respiration noted    Assessment and Plan:  SONNA LIPSKY is a 38 y.o. with irregular periods/AUB, desire to conceive  Abnormal uterine bleeding (AUB) Encounter for fertility planning - Discussed the utility of tracking cycles +/- ovulation predictor kits with timed intercourse  the 1-2 days prior to ovulation and the day of ovulation.  - Reviewed the approximately 80% of couples will conceive within the first 6 months of attempting pregnancy. Will start prelim fertility work up to r/o PCOS, subclinical hypothyroidism contributing to cycle irregularities - If e/o PCOS, may consider adding metformin  - Will give additional 3-6 months for timed IC, cycle tracking and optimization of weight/BP and if no conception can further pursue fertility workup, consideration of OI, and REI referral - Start PNV -     CBC -     TSH Rfx on Abnormal to Free T4 -     Testosterone ,Free and Total -     HgB A1c  BMI 40.0-44.9, adult (HCC) Discussed role for modest weight loss and improvement of ovulatory function. Declines dietician referral as she has already tried that Recommend PCP follow up for further discussion of weight management -     HgB A1c  Elevated blood pressure reading PCP follow up Reviewed optimization of BP prior to conception  Total encounter time: 33 minutes  Return in about 3 months (around 06/02/2024) for follow up pregnancy/conception.  Kieth JAYSON Carolin, MD

## 2024-03-02 NOTE — Patient Instructions (Addendum)
 The best days to conceive are the 1-2 days before you ovulate and the day you ovulate.   You can predict the day of ovulation in 4 ways: Tracking with a calendar. This works best if you have extremely regular menstrual cycles. Tracking changes to cervical mucus. You are most likely to conceive when cervical mucus is clear and slippery. Tracking your body temperature. This is less helpful because your body temperature doesn't rise until AFTER you ovulated (too late to time intercourse) It is ideal to use a special basal body temperature thermometer since it can pick up subtle differences. You take your temperature first thing in the morning - before you get out of bed, use the bathroom, or eat/drink anything. You will need to chart your temperature every day to be able to pick up on a meaningful rise. Tracking with ovulation predictor kits. These are urine tests that you do at home. They pick up a hormone in your urine called LH which spikes right before you ovulate. You should time intercourse to the day of the [facility] spike. You want to start testing in the 5 days before you think you'll ovulate, but the kits usually come with instructions that explain when to use them.  If you are trying to get pregnant, take a prenatal vitamin every day. They work best to help your baby's brain & spinal cord growth if you're taking them BEFORE you get pregnant.  Please schedule a primary care appointment to talk about blood pressure & weight

## 2024-03-02 NOTE — Progress Notes (Signed)
 CC: Discuss Fertility   Periods are irregular   Trying to conceive x 1 year

## 2024-03-03 ENCOUNTER — Ambulatory Visit: Payer: Self-pay | Admitting: Obstetrics and Gynecology

## 2024-03-03 DIAGNOSIS — E282 Polycystic ovarian syndrome: Secondary | ICD-10-CM

## 2024-03-03 LAB — HEMOGLOBIN A1C
Est. average glucose Bld gHb Est-mCnc: 103 mg/dL
Hgb A1c MFr Bld: 5.2 % (ref 4.8–5.6)

## 2024-03-03 LAB — CBC
Hematocrit: 40.5 % (ref 34.0–46.6)
Hemoglobin: 12.8 g/dL (ref 11.1–15.9)
MCH: 30 pg (ref 26.6–33.0)
MCHC: 31.6 g/dL (ref 31.5–35.7)
MCV: 95 fL (ref 79–97)
Platelets: 408 x10E3/uL (ref 150–450)
RBC: 4.26 x10E6/uL (ref 3.77–5.28)
RDW: 15.2 % (ref 11.7–15.4)
WBC: 5.1 x10E3/uL (ref 3.4–10.8)

## 2024-03-03 LAB — TESTOSTERONE,FREE AND TOTAL
Testosterone, Free: 5.3 pg/mL — ABNORMAL HIGH (ref 0.0–4.2)
Testosterone: 77 ng/dL — ABNORMAL HIGH (ref 8–60)

## 2024-03-03 LAB — TSH RFX ON ABNORMAL TO FREE T4: TSH: 0.612 u[IU]/mL (ref 0.450–4.500)

## 2024-03-10 ENCOUNTER — Other Ambulatory Visit: Payer: MEDICAID

## 2024-03-15 ENCOUNTER — Other Ambulatory Visit: Payer: MEDICAID

## 2024-03-17 ENCOUNTER — Other Ambulatory Visit: Payer: MEDICAID

## 2024-03-17 DIAGNOSIS — R030 Elevated blood-pressure reading, without diagnosis of hypertension: Secondary | ICD-10-CM

## 2024-03-17 DIAGNOSIS — Z6841 Body Mass Index (BMI) 40.0 and over, adult: Secondary | ICD-10-CM

## 2024-03-17 DIAGNOSIS — E282 Polycystic ovarian syndrome: Secondary | ICD-10-CM

## 2024-03-18 ENCOUNTER — Telehealth: Payer: MEDICAID | Admitting: Physician Assistant

## 2024-03-18 DIAGNOSIS — A084 Viral intestinal infection, unspecified: Secondary | ICD-10-CM | POA: Diagnosis not present

## 2024-03-18 MED ORDER — ONDANSETRON 4 MG PO TBDP: 4.0000 mg | ORAL_TABLET | Freq: Three times a day (TID) | ORAL | 0 refills | Status: AC | PRN

## 2024-03-18 NOTE — Progress Notes (Signed)
E-Visit for Nausea and Vomiting   We are sorry that you are not feeling well. Here is how we plan to help!  Based on what you have shared with me it looks like you have a Virus that is irritating your GI tract.  Vomiting is the forceful emptying of a portion of the stomach's content through the mouth.  Although nausea and vomiting can make you feel miserable, it's important to remember that these are not diseases, but rather symptoms of an underlying illness.  When we treat short term symptoms, we always caution that any symptoms that persist should be fully evaluated in a medical office.  I have prescribed a medication that will help alleviate your symptoms and allow you to stay hydrated:  Zofran 4 mg 1 tablet every 8 hours as needed for nausea and vomiting  HOME CARE: Drink clear liquids.  This is very important! Dehydration (the lack of fluid) can lead to a serious complication.  Start off with 1 tablespoon every 5 minutes for 8 hours. You may begin eating bland foods after 8 hours without vomiting.  Start with saltine crackers, white bread, rice, mashed potatoes, applesauce. After 48 hours on a bland diet, you may resume a normal diet. Try to go to sleep.  Sleep often empties the stomach and relieves the need to vomit.  GET HELP RIGHT AWAY IF:  Your symptoms do not improve or worsen within 2 days after treatment. You have a fever for over 3 days. You cannot keep down fluids after trying the medication.  MAKE SURE YOU:  Understand these instructions. Will watch your condition. Will get help right away if you are not doing well or get worse.    Thank you for choosing an e-visit.  Your e-visit answers were reviewed by a board certified advanced clinical practitioner to complete your personal care plan. Depending upon the condition, your plan could have included both over the counter or prescription medications.  Please review your pharmacy choice. Make sure the pharmacy is open so  you can pick up prescription now. If there is a problem, you may contact your provider through MyChart messaging and have the prescription routed to another pharmacy.  Your safety is important to us. If you have drug allergies check your prescription carefully.   For the next 24 hours you can use MyChart to ask questions about today's visit, request a non-urgent call back, or ask for a work or school excuse. You will get an email in the next two days asking about your experience. I hope that your e-visit has been valuable and will speed your recovery.  

## 2024-03-18 NOTE — Progress Notes (Signed)
 I have spent 5 minutes in review of e-visit questionnaire, review and updating patient chart, medical decision making and response to patient.   Piedad Climes, PA-C

## 2024-03-20 ENCOUNTER — Ambulatory Visit: Payer: Self-pay | Admitting: Obstetrics and Gynecology

## 2024-03-20 LAB — LIPID PANEL
Chol/HDL Ratio: 3.7 ratio (ref 0.0–4.4)
Cholesterol, Total: 158 mg/dL (ref 100–199)
HDL: 43 mg/dL (ref >39)
LDL Chol Calc (NIH): 95 mg/dL (ref 0–99)
Triglycerides: 109 mg/dL (ref 0–149)
VLDL Cholesterol Cal: 20 mg/dL (ref 5–40)

## 2024-03-20 LAB — 17-HYDROXYPROGESTERONE: 17-OH Progesterone LCMS: 40 ng/dL

## 2024-03-22 ENCOUNTER — Telehealth (INDEPENDENT_AMBULATORY_CARE_PROVIDER_SITE_OTHER): Payer: MEDICAID | Admitting: Obstetrics and Gynecology

## 2024-03-22 ENCOUNTER — Telehealth: Payer: MEDICAID | Admitting: Physician Assistant

## 2024-03-22 DIAGNOSIS — M546 Pain in thoracic spine: Secondary | ICD-10-CM

## 2024-03-22 DIAGNOSIS — E282 Polycystic ovarian syndrome: Secondary | ICD-10-CM | POA: Diagnosis not present

## 2024-03-22 MED ORDER — NAPROXEN 500 MG PO TABS: 500.0000 mg | ORAL_TABLET | Freq: Two times a day (BID) | ORAL | 0 refills | Status: AC

## 2024-03-22 MED ORDER — CYCLOBENZAPRINE HCL 10 MG PO TABS: 5.0000 mg | ORAL_TABLET | Freq: Three times a day (TID) | ORAL | 0 refills | Status: AC | PRN

## 2024-03-22 MED ORDER — METFORMIN HCL 500 MG PO TABS: ORAL_TABLET | ORAL | 11 refills | Status: AC

## 2024-03-22 NOTE — Progress Notes (Signed)

## 2024-03-22 NOTE — Progress Notes (Unsigned)
 TELEHEALTH GYNECOLOGY VISIT ENCOUNTER NOTE  Provider location: [facility] for [facility]   Patient location: Home  I connected with Emma Green on 03/23/24 at 4:30PM by MyChart audiovisual encounter    I discussed the limitations, risks, security and privacy concerns of performing an evaluation and management service by telephone and the availability of in person appointments. I also discussed with the patient that there may be a patient responsible charge related to this service. The patient expressed understanding and agreed to proceed.   History:  Emma Green is a 38 y.o. G2P1011 with newly diagnosed PCOS presenting for follow up   Saw patient 7/8 for irregular cycles and TTC. Workup revealed elevated total testosterone  (77) but normal CBC, CMP, TSH. Follow up labs confirmed no e/o prediabetes, normal lipid profile, and normal 17-OHP.   Presents today to discuss next steps.    Past Medical History:  Diagnosis Date   Anxiety    Blood transfusion without reported diagnosis    Cervical cyst    Chlamydia    Depression    Exertional shortness of breath    Headache(784.0)    weekly (07/08/2013)   High cholesterol    Hypertension 08/28/2022   Migraine    monthly (07/08/2013)   MRSA (methicillin resistant staph aureus) culture positive    Pneumonia 2011   PTSD (post-traumatic stress disorder)    Pyelonephritis    Right nephrolithiasis 02/2020   UTI (urinary tract infection) 07/08/2013   Past Surgical History:  Procedure Laterality Date   CESAREAN SECTION  2008   TIBIA IM NAIL INSERTION Left 08/26/2013   Procedure: INTRAMEDULLARY (IM) NAIL TIBIAL;  Surgeon: Evalene JONETTA Chancy, MD;  Location: [facility] OR;  Service: Orthopedics;  Laterality: Left;   The following portions of the patient's history were reviewed and updated as appropriate: allergies, current medications, past family history, past medical history, past social history, past surgical history  and problem list.   Review of Systems:  Pertinent items noted in HPI and remainder of comprehensive ROS otherwise negative.  Physical Exam:   General:  Alert, oriented and cooperative.   Mental Status: Normal mood and affect perceived. Normal judgment and thought content.  Physical exam deferred due to nature of the encounter  Labs and Imaging Results for orders placed or performed in visit on 03/17/24 (from the past 2 weeks)  Lipid panel   Collection Time: 03/17/24  9:27 AM  Result Value Ref Range   Cholesterol, Total 158 100 - 199 mg/dL   Triglycerides 890 0 - 149 mg/dL   HDL 43 >60 mg/dL   VLDL Cholesterol Cal 20 5 - 40 mg/dL   LDL Chol Calc (NIH) 95 0 - 99 mg/dL   Chol/HDL Ratio 3.7 0.0 - 4.4 ratio  17-Hydroxyprogesterone   Collection Time: 03/17/24  9:27 AM  Result Value Ref Range   17-OH Progesterone LCMS 40 ng/dL   No results found.    Assessment and Plan:   PCOS - Discussed diagnosis of PCOS and on what basis. Reviewed long term implication of issues with weight loss/metabolic syndrome, infertility, and risk of endometrial hyperplasia/malignancy - Declines dietician referral. Needs PCP - list given - Discussed potential improvement in ovulatory function with metformin , she is accepting and would like to try -- will wait to start until her GI bug has passed - Discussed importance of endometrial protection with different hormonal methods to prevent hyperplasia/malignancy in the setting of oligomenorrhea. She would like to do provera prn. -  metFORMIN  (GLUCOPHAGE ) 500 MG tablet; Take 1 tablet (500 mg total) by mouth at bedtime for 7 days, THEN 1 tablet (500 mg total) in the morning and at bedtime.  2. Preconception counseling - Will start metformin  as above - Would like to track cycles and timed intercourse for next 3-6 months - Plan for MFM consult prior to OI if needed  I provided 30 minutes of non-face-to-face time during this encounter.  Kieth JAYSON Carolin,  MD [facility] for [employer], [facility]

## 2024-03-22 NOTE — Patient Instructions (Signed)

## 2024-06-16 ENCOUNTER — Telehealth: Payer: MEDICAID | Admitting: Physician Assistant

## 2024-06-16 DIAGNOSIS — L239 Allergic contact dermatitis, unspecified cause: Secondary | ICD-10-CM | POA: Diagnosis not present

## 2024-06-17 MED ORDER — PREDNISONE 10 MG PO TABS: ORAL_TABLET | ORAL | 0 refills | Status: AC

## 2024-06-17 NOTE — Progress Notes (Signed)
 E Visit for Rash  We are sorry that you are not feeling well. Here is how we plan to help!  Based on what you shared with me it looks like you have contact dermatitis.  Contact dermatitis is a skin rash caused by something that touches the skin and causes irritation or inflammation.  Your skin may be red, swollen, dry, cracked, and itch.  The rash should go away in a few days but can last a few weeks.  If you get a rash, it's important to figure out what caused it so the irritant can be avoided in the future. and I am prescribing a two week course of steroids (37 tablets of 10 mg prednisone ).  Days 1-4 take 4 tablets (40 mg) daily  Days 5-8 take 3 tablets (30 mg) daily, Days 9-11 take 2 tablets (20 mg) daily, Days 12-14 take 1 tablet (10 mg) daily.    HOME CARE:  Take cool showers and avoid direct sunlight. Apply cool compress or wet dressings. Take a bath in an oatmeal bath.  Sprinkle content of one Aveeno packet under running faucet with comfortably warm water .  Bathe for 15-20 minutes, 1-2 times daily.  Pat dry with a towel. Do not rub the rash. Use hydrocortisone cream. Take an antihistamine like Benadryl  for widespread rashes that itch.  The adult dose of Benadryl  is 25-50 mg by mouth 4 times daily. Caution:  This type of medication may cause sleepiness.  Do not drink alcohol, drive, or operate dangerous machinery while taking antihistamines.  Do not take these medications if you have prostate enlargement.  Read package instructions thoroughly on all medications that you take.  GET HELP RIGHT AWAY IF:  Symptoms don't go away after treatment. Severe itching that persists. If you rash spreads or swells. If you rash begins to smell. If it blisters and opens or develops a yellow-brown crust. You develop a fever. You have a sore throat. You become short of breath.  MAKE SURE YOU:  Understand these instructions. Will watch your condition. Will get help right away if you are not doing  well or get worse.  Thank you for choosing an e-visit. Your e-visit answers were reviewed by a board certified advanced clinical practitioner to complete your personal care plan. Depending upon the condition, your plan could have included both over the counter or prescription medications. Please review your pharmacy choice. Be sure that the pharmacy you have chosen is open so that you can pick up your prescription now.  If there is a problem you may message your provider in MyChart to have the prescription routed to another pharmacy. Your safety is important to us . If you have drug allergies check your prescription carefully.  For the next 24 hours, you can use MyChart to ask questions about today's visit, request a non-urgent call back, or ask for a work or school excuse from your e-visit provider. You will get an email in the next two days asking about your experience. I hope that your e-visit has been valuable and will speed your recovery.  I have spent 5 minutes in review of e-visit questionnaire, review and updating patient chart, medical decision making and response to patient.   Elsie Velma Lunger, PA-C

## 2024-07-31 ENCOUNTER — Telehealth: Payer: MEDICAID | Admitting: Nurse Practitioner

## 2024-07-31 DIAGNOSIS — J029 Acute pharyngitis, unspecified: Secondary | ICD-10-CM

## 2024-07-31 MED ORDER — LIDOCAINE VISCOUS HCL 2 % MT SOLN: 15.0000 mL | OROMUCOSAL | 0 refills | Status: AC | PRN

## 2024-07-31 NOTE — Progress Notes (Signed)
 We are sorry that you are not feeling well.  Here is how we plan to help!  Your symptoms indicate a likely viral infection (Pharyngitis).   Pharyngitis is inflammation in the back of the throat which can cause a sore throat, scratchiness and sometimes difficulty swallowing.   Pharyngitis is typically caused by a respiratory virus and will just run its course.  Please keep in mind that your symptoms could last up to 10 days.    For throat pain, we recommend over the counter oral pain relief medications such as acetaminophen or aspirin, or anti-inflammatory medications such as ibuprofen or naproxen  sodium.  Topical treatments such as oral throat lozenges or sprays may be used as needed. For throat pain, I have prescribed a Viscous Lidocaine  2% solution. Swallow 5-10 mL every 4-6 hours as needed for sore throat. DO NOT eat or drink anything for 15-20 minutes after swallowing to allow the medication to coat the throat.SABRA  Avoid close contact with loved ones, especially the very young and elderly.  Remember to wash your hands thoroughly throughout the day as this is the number one way to prevent the spread of infection! We also recommend that you periodically wipe down door knobs and counters with disinfectant.  After careful review of your answers, I would not recommend and antibiotic for your condition.  Antibiotics should not be used to treat conditions that we suspect are caused by viruses like the virus that causes the common cold or flu.  Providers prescribe antibiotics to treat infections caused by bacteria. Antibiotics are very powerful in treating bacterial infections when they are used properly.  To maintain their effectiveness, they should be used only when necessary.  Overuse of antibiotics has resulted in the development of super bugs that are resistant to treatment!    Some people with strep throat, however, can have atypical symptoms. As such, if anything continued to progress despite treatment  recommendations, you may need formal testing in clinic or office.  Home Care: Only take medications as instructed by your medical team. Do not drink alcohol while taking these medications. A steam or ultrasonic humidifier can help congestion.  You can place a towel over your head and breathe in the steam from hot water coming from a faucet. Avoid close contacts especially the very young and the elderly. Cover your mouth when you cough or sneeze. Always remember to wash your hands.  Get Help Right Away If: You develop worsening fever or throat pain. You develop a severe head ache or visual changes. Your symptoms persist after you have completed your treatment plan.  Make sure you Understand these instructions. Will watch your condition. Will get help right away if you are not doing well or get worse.  Your e-visit answers were reviewed by a board certified advanced clinical practitioner to complete your personal care plan.  Depending on the condition, your plan could have included both over the counter or prescription medications.  If there is a problem please reply once you have received a response from your provider.  Your safety is important to us .  If you have drug allergies check your prescription carefully.    You can use MyChart to ask questions about todays visit, request a non-urgent call back, or ask for a work or school excuse for 24 hours related to this e-Visit. If it has been greater than 24 hours you will need to follow up with your provider, or enter a new e-Visit to address those concerns.  You will get an e-mail in the next two days asking about your experience.  I hope that your e-visit has been valuable and will speed your recovery. Thank you for using e-visits.   I have spent 5 minutes in review of e-visit questionnaire, review and updating patient chart, medical decision making and response to patient.   Quintasha Gren W Jimmy Stipes, NP

## 2024-07-31 NOTE — Progress Notes (Signed)
Letter in mychart

## 2024-08-22 ENCOUNTER — Telehealth: Payer: MEDICAID | Admitting: Family

## 2024-08-22 DIAGNOSIS — J029 Acute pharyngitis, unspecified: Secondary | ICD-10-CM | POA: Diagnosis not present

## 2024-08-22 MED ORDER — AMOXICILLIN 500 MG PO CAPS: 500.0000 mg | ORAL_CAPSULE | Freq: Two times a day (BID) | ORAL | 0 refills | Status: AC

## 2024-08-22 NOTE — Progress Notes (Signed)

## 2024-08-30 ENCOUNTER — Ambulatory Visit: Payer: MEDICAID

## 2024-08-30 VITALS — BP 160/122 | HR 93 | Wt 211.9 lb

## 2024-08-30 DIAGNOSIS — Z3200 Encounter for pregnancy test, result unknown: Secondary | ICD-10-CM

## 2024-08-30 DIAGNOSIS — Z3202 Encounter for pregnancy test, result negative: Secondary | ICD-10-CM

## 2024-08-30 DIAGNOSIS — Z7689 Persons encountering health services in other specified circumstances: Secondary | ICD-10-CM

## 2024-08-30 LAB — POCT URINE PREGNANCY: Preg Test, Ur: NEGATIVE

## 2024-08-30 NOTE — Progress Notes (Signed)
..  [name] presents today for UPT. Patient has a hx of PCOS and irregular menstrual cycles. She states that she is trying to conceive and since starting Metformin  a few months ago her cycles had started to become regular until she missed the month of November. Pt also had elevated BPs in office today, denies abnormal symptoms and states that she needs a new PCP. LMP:07/12/24    OBJECTIVE: Appears well, in no apparent distress.  OB History     Gravida  2   Para  1   Term  1   Preterm      AB  1   Living  1      SAB  1   IAB      Ectopic      Multiple      Live Births  1          Home UPT Result: Not performed In-Office UPT result: Negative I have reviewed the patient's medical, obstetrical, social, and family histories, and medications.   ASSESSMENT: Negative pregnancy test  PLAN Prenatal care to be completed at: N/A Pt is scheduled for annual visit on 09/28/24, advised pt to discuss irregular cycles at next visit with provider. Referral for PCP placed and provided list for PCPs Advised pt of abnormal symptoms related to BP, and advised to report to ED if they occur, pt agreed.

## 2024-09-20 ENCOUNTER — Ambulatory Visit (HOSPITAL_BASED_OUTPATIENT_CLINIC_OR_DEPARTMENT_OTHER): Payer: MEDICAID

## 2024-09-28 ENCOUNTER — Ambulatory Visit: Payer: MEDICAID | Admitting: Obstetrics and Gynecology

## 2024-10-11 ENCOUNTER — Ambulatory Visit (HOSPITAL_BASED_OUTPATIENT_CLINIC_OR_DEPARTMENT_OTHER): Payer: MEDICAID
